# Patient Record
Sex: Male | Born: 1945 | Race: White | Hispanic: No | Smoking: Former smoker
Health system: Southern US, Community
[De-identification: ages and names within clinical notes are randomized; demographics above are authoritative.]

## PROBLEM LIST (undated history)

## (undated) DIAGNOSIS — J439 Emphysema, unspecified: Secondary | ICD-10-CM

## (undated) DIAGNOSIS — I509 Heart failure, unspecified: Secondary | ICD-10-CM

## (undated) DIAGNOSIS — I4891 Unspecified atrial fibrillation: Secondary | ICD-10-CM

## (undated) DIAGNOSIS — H3581 Retinal edema: Secondary | ICD-10-CM

## (undated) DIAGNOSIS — H35039 Hypertensive retinopathy, unspecified eye: Secondary | ICD-10-CM

## (undated) DIAGNOSIS — H35359 Cystoid macular degeneration, unspecified eye: Secondary | ICD-10-CM

## (undated) DIAGNOSIS — Z87898 Personal history of other specified conditions: Secondary | ICD-10-CM

## (undated) DIAGNOSIS — J449 Chronic obstructive pulmonary disease, unspecified: Secondary | ICD-10-CM

## (undated) DIAGNOSIS — C4490 Unspecified malignant neoplasm of skin, unspecified: Secondary | ICD-10-CM

## (undated) DIAGNOSIS — R0602 Shortness of breath: Secondary | ICD-10-CM

## (undated) DIAGNOSIS — I499 Cardiac arrhythmia, unspecified: Secondary | ICD-10-CM

## (undated) DIAGNOSIS — R7303 Prediabetes: Secondary | ICD-10-CM

## (undated) DIAGNOSIS — I4892 Unspecified atrial flutter: Secondary | ICD-10-CM

## (undated) DIAGNOSIS — C801 Malignant (primary) neoplasm, unspecified: Secondary | ICD-10-CM

## (undated) HISTORY — PX: THORACOTOMY: SUR1349

## (undated) HISTORY — DX: Cystoid macular degeneration, unspecified eye: H35.359

## (undated) HISTORY — PX: EXPLORATORY LAPAROTOMY: SUR591

## (undated) HISTORY — DX: Personal history of other specified conditions: Z87.898

## (undated) HISTORY — DX: Chronic obstructive pulmonary disease, unspecified: J44.9

## (undated) HISTORY — PX: FINGER SURGERY: SHX640

## (undated) HISTORY — DX: Retinal edema: H35.81

## (undated) HISTORY — DX: Unspecified malignant neoplasm of skin, unspecified: C44.90

## (undated) HISTORY — DX: Hypertensive retinopathy, unspecified eye: H35.039

## (undated) HISTORY — PX: SKIN CANCER EXCISION: SHX779

## (undated) HISTORY — PX: TONSILLECTOMY: SUR1361

---

## 2003-02-14 ENCOUNTER — Encounter (INDEPENDENT_AMBULATORY_CARE_PROVIDER_SITE_OTHER): Payer: Self-pay | Admitting: *Deleted

## 2003-02-14 ENCOUNTER — Ambulatory Visit (HOSPITAL_COMMUNITY): Admission: RE | Admit: 2003-02-14 | Discharge: 2003-02-14 | Payer: Self-pay | Admitting: Gastroenterology

## 2007-02-27 ENCOUNTER — Encounter: Admission: RE | Admit: 2007-02-27 | Discharge: 2007-02-27 | Payer: Self-pay | Admitting: Rheumatology

## 2007-02-28 ENCOUNTER — Encounter: Admission: RE | Admit: 2007-02-28 | Discharge: 2007-02-28 | Payer: Self-pay | Admitting: Rheumatology

## 2010-12-07 NOTE — Op Note (Signed)
NAME:  Jason Carter, Jason Carter NO.:  [id number]   MEDICAL RECORD NO.:  [id number]                   PATIENT TYPE:  AMB   LOCATION:  ENDO                                 FACILITY:  [facility]   PHYSICIAN:  Anselmo Rod, M.D.               DATE OF BIRTH:  03-13-1946   DATE OF PROCEDURE:  02/14/2003  DATE OF DISCHARGE:                                 OPERATIVE REPORT   PROCEDURE PERFORMED:  Colonoscopy with snare polypectomy times one and cold  biopsies times six.   ENDOSCOPIST:  Charna Elizabeth, M.D.   INSTRUMENT USED:  Olympus video colonoscope.   INDICATIONS FOR PROCEDURE:  Screening colonoscopy is being performed in a 65-  year-old white male with a history of abdominal pain and change in bowel  habits. Rule out colonic polyps, masses, etc.   PREPROCEDURE PREPARATION:  Informed consent was procured from the patient.  The patient was fasted for eight hours prior to the procedure and prepped  with a bottle of magnesium citrate and a gallon of GoLYTELY the night prior  to the procedure.   PREPROCEDURE PHYSICAL:  The patient had stable vital signs.  Neck supple.  Chest clear to auscultation.  S1 and S2 regular.  Abdomen soft with normal  bowel sounds.   DESCRIPTION OF PROCEDURE:  The patient was placed in left lateral decubitus  position and sedated with 70 mg of Demerol and 7 mg of Versed intravenously.  Once the patient was adequately sedated and maintained on low flow oxygen  and continuous cardiac monitoring, the Olympus video colonoscope was  advanced from the rectum to the cecum with difficulty.  There was evidence  of extensive diverticulosis throughout the left colon with inspissated stool  in some of the diverticula.  A pedunculated polyp was snared from 12 cm.  Multiple small sessile polyps were biopsied from 12 and 20 cm which was done  by cold biopsy forceps.  The rest of the colonic mucosa including the  transverse colon.  The right colon, cecum  and terminal ileum appeared  normal.  Retroflexion in the rectum revealed no abnormalities.  The patient  tolerated the procedure well without complication.   IMPRESSION:  1. A pedunculated polyp and multiple small sessile polyps were removed (see     description above).  2. Extensive left-sided diverticulosis.  3. Normal-appearing transverse colon, right colon, cecum and terminal ileum.               RECOMMENDATIONS:  1. Await pathology results.  2. High fiber diet with liberal fluid intake.  3. Avoid all nonsteroidals including aspirin for the next two or three     weeks.  4. Outpatient follow-up in the next two weeks for further recommendations.  Anselmo Rod, M.D.    JNM/MEDQ  D:  02/15/2003  T:  02/16/2003  Job:  [id number]   cc:   Gabriel Earing, M.D.  [address]  [place]  [place] [address]  Fax: [phone]

## 2011-04-15 ENCOUNTER — Inpatient Hospital Stay (HOSPITAL_COMMUNITY)
Admission: EM | Admit: 2011-04-15 | Discharge: 2011-04-21 | DRG: 340 | Disposition: A | Discharge status: Home / Self Care | Payer: Medicare Other | Attending: General Surgery | Admitting: General Surgery

## 2011-04-15 ENCOUNTER — Emergency Department (HOSPITAL_COMMUNITY): Payer: Medicare Other

## 2011-04-15 DIAGNOSIS — J4489 Other specified chronic obstructive pulmonary disease: Secondary | ICD-10-CM | POA: Diagnosis present

## 2011-04-15 DIAGNOSIS — K352 Acute appendicitis with generalized peritonitis, without abscess: Secondary | ICD-10-CM

## 2011-04-15 DIAGNOSIS — R1031 Right lower quadrant pain: Secondary | ICD-10-CM

## 2011-04-15 DIAGNOSIS — J449 Chronic obstructive pulmonary disease, unspecified: Secondary | ICD-10-CM | POA: Diagnosis present

## 2011-04-15 DIAGNOSIS — R112 Nausea with vomiting, unspecified: Secondary | ICD-10-CM

## 2011-04-15 DIAGNOSIS — K35209 Acute appendicitis with generalized peritonitis, without abscess, unspecified as to perforation: Principal | ICD-10-CM | POA: Diagnosis present

## 2011-04-15 DIAGNOSIS — Z5332 Thoracoscopic surgical procedure converted to open procedure: Secondary | ICD-10-CM

## 2011-04-15 LAB — CBC
HCT: 39 % (ref 39.0–52.0)
MCHC: 33.6 g/dL (ref 30.0–36.0)
MCV: 88.2 fL (ref 78.0–100.0)
Platelets: 186 10*3/uL (ref 150–400)
RDW: 14 % (ref 11.5–15.5)
WBC: 24.9 10*3/uL — ABNORMAL HIGH (ref 4.0–10.5)

## 2011-04-15 LAB — URINE MICROSCOPIC-ADD ON

## 2011-04-15 LAB — DIFFERENTIAL
Eosinophils Absolute: 0 10*3/uL (ref 0.0–0.7)
Eosinophils Relative: 0 % (ref 0–5)
Lymphocytes Relative: 8 % — ABNORMAL LOW (ref 12–46)
Lymphs Abs: 2.1 10*3/uL (ref 0.7–4.0)
Monocytes Absolute: 2.5 10*3/uL — ABNORMAL HIGH (ref 0.1–1.0)

## 2011-04-15 LAB — LIPASE, BLOOD: Lipase: 9 U/L — ABNORMAL LOW (ref 11–59)

## 2011-04-15 LAB — COMPREHENSIVE METABOLIC PANEL
AST: 21 U/L (ref 0–37)
Albumin: 3.6 g/dL (ref 3.5–5.2)
BUN: 17 mg/dL (ref 6–23)
Calcium: 9.4 mg/dL (ref 8.4–10.5)
Chloride: 94 mEq/L — ABNORMAL LOW (ref 96–112)
Creatinine, Ser: 0.79 mg/dL (ref 0.50–1.35)
Total Protein: 7.2 g/dL (ref 6.0–8.3)

## 2011-04-15 LAB — URINALYSIS, ROUTINE W REFLEX MICROSCOPIC
Glucose, UA: NEGATIVE mg/dL
Nitrite: NEGATIVE
Specific Gravity, Urine: 1.024 (ref 1.005–1.030)
pH: 5.5 (ref 5.0–8.0)

## 2011-04-15 MED ORDER — IOHEXOL 300 MG/ML  SOLN
100.0000 mL | Freq: Once | INTRAMUSCULAR | Status: AC | PRN
  Administered 2011-04-15: 100 mL via INTRAVENOUS

## 2011-04-16 ENCOUNTER — Other Ambulatory Visit (INDEPENDENT_AMBULATORY_CARE_PROVIDER_SITE_OTHER): Payer: Self-pay | Admitting: General Surgery

## 2011-04-16 HISTORY — PX: APPENDECTOMY: SHX54

## 2011-04-16 NOTE — Op Note (Signed)
Jason Carter, Jason Carter NO.:  [id number]  MEDICAL RECORD NO.:  [id number]  LOCATION:  WLED                         FACILITY:  [facility]  PHYSICIAN:  Angelia Mould. Derrell Lolling, M.D.DATE OF BIRTH:  [birth year]  DATE OF PROCEDURE:  04/16/2011 DATE OF DISCHARGE:                              OPERATIVE REPORT   PREOPERATIVE DIAGNOSIS:  Ruptured, retrocecal appendicitis.  POSTOPERATIVE DIAGNOSIS:  Ruptured, retrocecal appendicitis.  OPERATION PERFORMED:  Laparoscopy, open appendectomy.  SURGEON:  Angelia Mould. Derrell Lolling, M.D.  OPERATIVE INDICATION:  This is a 65 year old Caucasian man who presented with a 36-hour history of right flank and right upper quadrant pain.  On exam, he was found be tender in the right upper quadrant and right lower quadrant.  His CT scan showed a retrocecal appendicitis with air bubbles and fluid behind the ascending colon.  The appendix went retrocecal all the way up to the subhepatic space on the CT scan.  The patient was evaluated in the emergency room and taken to the operating room urgently.  OPERATIVE FINDINGS:  The patient's appendix was completely retrocecal. It was ruptured in several areas.  There was stool throughout the appendix that had been spilled into the retroperitoneal space but he did not have diffuse peritonitis.  The liver and gallbladder looked normal.  The terminal ileum, cecum, and transverse colon looked normal.  OPERATIVE TECHNIQUE:  Following induction of general endotracheal anesthesia, Foley catheter was inserted.  The abdomen was prepped and draped in the sterile fashion.  Surgical time-out was held identifying correct patient and correct procedure.  Intravenous antibiotics had been given in the emergency room immediately preop.  Using an open technique, I placed an 11 mm Hassan trocar in the midline just above the umbilicus.  Entry was uneventful.  Pneumoperitoneum was created.  Video cam was inserted.  I placed a 5 mm  trocar in the right upper quadrant, 5 mm trocar in the left midabdomen and 12 mm trocar in the suprapubic area.  I spent about 45 minutes trying to mobilize and dissect the appendix.  I mobilized the terminal ileum and the right colon by dividing their lateral peritoneal attachments.  I looked from the right upper quadrant and the right lower quadrant.  We never really could see the base of the appendix or the tip of the appendix.  We were concerned that we would enter the right colon or the duodenum and so after lengthy dissection, we abandoned the laparoscopic approach and converted to an open operation.  A transverse incision was made in the right abdomen just at the level of the umbilicus.  Dissection was carried down through the subcutaneous tissue.  The lateral aspect of the rectus muscle and the internal and external oblique and transversus abdominis muscles were divided.  We entered the abdomen and divided the peritoneum.  Self-retaining retractors were placed.  We further mobilized the hepatic flexure, and in doing so, encountered the tip of the ruptured appendix and some stool around the appendix. This allowed [place] to trace the appendix all the way back down to its insertion in the base of the cecum which was completely posterior.  We isolated the base of the  appendix and transected it at the level of the cecum with a GIA stapler.  We oversewed the staple line on the cecum with three interrupted sutures of 3-0 Vicryl.  We took a sharp scissor dissection and mobilized the appendix off the back wall of the cecum and the right colon.  We felt that we removed the entirety of the appendix, although it was ruptured in several places. It was sent to pathology.  We inspected the terminal ileum, the right colon, and the hepatic flexure.  There was no injury from the dissection.  The liver and gallbladder looked fine.  There was no injury there.  We irrigated out the pelvis,  abdomen, subphrenic spaces with about 3 liters of saline. There was a lot of raw surface in the right retroperitoneum.  I chose to place a 19-French Blake drain through the right upper quadrant trocar site.  I brought this down under the subphrenic space and then down the right paracolic gutter.  The drain was sutured in place with a nylon suture.  I was satisfied with hemostasis.  The transversus abdominis, internal oblique, and posterior rectus sheath were closed with running suture of #1 PDS.  After irrigating the wound, I closed the external oblique and the anterior rectus sheath with a running suture of #1 PDS. All the incisions were closed loosely with staples and we placed Telfa wicks in-between to promote drainage.  Clean bandages were placed and the patient taken to recovery room in stable condition.  Estimated blood loss was about 30 mL.  Complications none.  Sponge, needle, and instrument counts were correct.     Angelia Mould. Derrell Lolling, M.D.     HMI/MEDQ  D:  04/16/2011  T:  04/16/2011  Job:  [id number]  cc:   [facility] [address]  Electronically Signed by Claud Kelp M.D. on 04/16/2011 07:23:36 AM
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
## 2011-04-17 LAB — COMPREHENSIVE METABOLIC PANEL
AST: 22 U/L (ref 0–37)
CO2: 27 mEq/L (ref 19–32)
Calcium: 8.9 mg/dL (ref 8.4–10.5)
Creatinine, Ser: 0.63 mg/dL (ref 0.50–1.35)
GFR calc Af Amer: >60 mL/min (ref >60)
GFR calc non Af Amer: >60 mL/min (ref >60)
Glucose, Bld: 102 mg/dL — ABNORMAL HIGH (ref 70–99)
Sodium: 140 mEq/L (ref 135–145)
Total Protein: 5.5 g/dL — ABNORMAL LOW (ref 6.0–8.3)

## 2011-04-17 LAB — CBC
MCH: 29.1 pg (ref 26.0–34.0)
MCHC: 32.4 g/dL (ref 30.0–36.0)
MCV: 89.9 fL (ref 78.0–100.0)
Platelets: 175 10*3/uL (ref 150–400)
RBC: 3.78 MIL/uL — ABNORMAL LOW (ref 4.22–5.81)

## 2011-04-24 ENCOUNTER — Encounter (INDEPENDENT_AMBULATORY_CARE_PROVIDER_SITE_OTHER): Payer: Self-pay | Admitting: Surgery

## 2011-04-26 ENCOUNTER — Encounter (INDEPENDENT_AMBULATORY_CARE_PROVIDER_SITE_OTHER): Payer: Self-pay | Admitting: Surgery

## 2011-04-26 ENCOUNTER — Ambulatory Visit (INDEPENDENT_AMBULATORY_CARE_PROVIDER_SITE_OTHER): Payer: 59 | Admitting: Surgery

## 2011-04-26 VITALS — BP 110/66 | HR 68 | Temp 97.1°F | Resp 16 | Ht 67.5 in | Wt 179.8 lb

## 2011-04-26 DIAGNOSIS — Z8719 Personal history of other diseases of the digestive system: Secondary | ICD-10-CM
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
## 2011-04-29 ENCOUNTER — Encounter (INDEPENDENT_AMBULATORY_CARE_PROVIDER_SITE_OTHER): Payer: 59

## 2011-05-21 NOTE — Discharge Summary (Signed)
  NAMEORVA, GWALTNEY NO.:  [id number]  MEDICAL RECORD NO.:  [id number]  LOCATION:  1540                         FACILITY:  [facility]  PHYSICIAN:  Velora Heckler, MD      DATE OF BIRTH:  1946/02/02  DATE OF ADMISSION:  04/15/2011 DATE OF DISCHARGE:  04/21/2011                              DISCHARGE SUMMARY   HISTORY OF PRESENT ILLNESS:  Mr. Nott is a 65 year old gentleman, who presented with right-sided abdominal pain, fever, and chills.  He was evaluated by Dr. Derrell Lolling after his workup in the ER showed evidence of a retrocecal ruptured appendicitis.  Decision was made after his evaluation to start the patient on IV antibiotics and admit for operative intervention.  SUMMARY OF HOSPITAL COURSE:  Patient was admitted on April 15, 2011, taken to the operating room and underwent attempted laparoscopic, but ultimately open appendectomy due to the ruptured retrocecal appendix. Patient tolerated the procedure well and was admitted to the floor in a stable condition.  He was placed on IV antibiotics and a drain was placed.  Postoperatively, the patient had good pain control, started having bowel function, never really developed significant ileus.  His drain remained intact and never really put out any purulent fluid.  He eventually started tolerating liquid diet and was gradually advanced to solid diet, which he tolerated well.  He had no other significant complications.  On the postop day #4, the patient was feeling well, but still having some serosanguineous draining from his drains.  Therefore, the decision was made to send the patient home with his drain intact.  DISCHARGE DIAGNOSIS:  Acute ruptured appendicitis, status post open appendectomy.  DISCHARGE MEDICATIONS:  Will include: 1. Augmentin 875 1 tablet twice daily for 7 days. 2. Percocet 1 to 2 tabs q.4 hours p.r.n., pain. 3. Phenergan 12.5 mg every 4 hours p.r.n., nausea. 4. Allopurinol 300 mg daily. 5.  Ibuprofen 200 mg every 8 hours p.r.n. 6. Multivitamin once daily. He is, otherwise, given preprinted discharge instructions to follow up in our clinic for drain removal.     Brayton El, PA-C   ______________________________ Velora Heckler, MD   KB/MEDQ  D:  05/15/2011  T:  05/16/2011  Job:  [id number]  Electronically Signed by Darnell Level MD on 05/21/2011 10:55:01 AM

## 2015-11-29 DIAGNOSIS — J40 Bronchitis, not specified as acute or chronic: Secondary | ICD-10-CM | POA: Diagnosis not present

## 2015-11-29 DIAGNOSIS — R0989 Other specified symptoms and signs involving the circulatory and respiratory systems: Secondary | ICD-10-CM | POA: Diagnosis not present

## 2015-11-29 DIAGNOSIS — R05 Cough: Secondary | ICD-10-CM | POA: Diagnosis not present

## 2015-11-29 DIAGNOSIS — R062 Wheezing: Secondary | ICD-10-CM | POA: Diagnosis not present

## 2016-02-20 DIAGNOSIS — M25562 Pain in left knee: Secondary | ICD-10-CM | POA: Diagnosis not present

## 2016-02-20 DIAGNOSIS — M542 Cervicalgia: Secondary | ICD-10-CM | POA: Diagnosis not present

## 2016-02-20 DIAGNOSIS — M109 Gout, unspecified: Secondary | ICD-10-CM | POA: Diagnosis not present

## 2016-02-20 DIAGNOSIS — R05 Cough: Secondary | ICD-10-CM | POA: Diagnosis not present

## 2016-04-22 DIAGNOSIS — Z23 Encounter for immunization: Secondary | ICD-10-CM | POA: Diagnosis not present

## 2016-07-02 DIAGNOSIS — R0609 Other forms of dyspnea: Secondary | ICD-10-CM | POA: Diagnosis not present

## 2016-07-02 DIAGNOSIS — D71 Functional disorders of polymorphonuclear neutrophils: Secondary | ICD-10-CM | POA: Diagnosis not present

## 2016-07-02 DIAGNOSIS — R062 Wheezing: Secondary | ICD-10-CM | POA: Diagnosis not present

## 2016-07-02 DIAGNOSIS — J984 Other disorders of lung: Secondary | ICD-10-CM | POA: Diagnosis not present

## 2016-07-02 DIAGNOSIS — R05 Cough: Secondary | ICD-10-CM | POA: Diagnosis not present

## 2016-07-02 DIAGNOSIS — Z7689 Persons encountering health services in other specified circumstances: Secondary | ICD-10-CM | POA: Diagnosis not present

## 2017-02-03 ENCOUNTER — Encounter: Payer: Self-pay | Admitting: Physician Assistant

## 2017-02-03 ENCOUNTER — Ambulatory Visit (INDEPENDENT_AMBULATORY_CARE_PROVIDER_SITE_OTHER): Payer: Medicare Other | Admitting: Physician Assistant

## 2017-02-03 VITALS — BP 126/88 | HR 107 | Temp 97.8°F | Resp 18 | Ht 67.5 in | Wt 179.0 lb

## 2017-02-03 DIAGNOSIS — M25462 Effusion, left knee: Secondary | ICD-10-CM | POA: Diagnosis not present
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
## 2017-02-03 NOTE — Patient Instructions (Signed)
     IF you received an x-ray today, you will receive an invoice from [facility]. Please contact  Radiology at [phone] with questions or concerns regarding your invoice.   IF you received labwork today, you will receive an invoice from [facility]. Please contact [facility] at [phone] with questions or concerns regarding your invoice.   Our billing staff will not be able to assist you with questions regarding bills from these companies.  You will be contacted with the lab results as soon as they are available. The fastest way to get your results is to activate your My Chart account. Instructions are located on the last page of this paperwork. If you have not heard from us regarding the results in 2 weeks, please contact this office.     

## 2017-02-05 ENCOUNTER — Telehealth: Payer: Self-pay

## 2017-02-05 NOTE — Telephone Encounter (Addendum)
-----   Message from Tereasa Coop, PA-C sent at 02/05/2017  2:53 PM EDT ----- Please give him a call.  How is he feeling.  The fluid analysis is very non specific.  There is no gout. He'd probably be fine given his normal creatinine with 400-600 mg ibuprofen every 8 hours.  I'd be very comfortable with tramadol 25-50 every 8 if he'd like that.  Let me know know and I will write if he desires.  Other options are an ortho referral, retap if the knee has re-effused with an image, however the lack of blood in the assay points away from tendinous or ligamentous injury. Philis Fendt, MS, PA-C 2:53 PM, 02/05/2017        Philis Fendt, MS, PA-C 2:49 PM, 02/05/2017  Call placed to patient as requested, no answer on patient phone. Left message for patient to return call to office./ S.Zamantha Strebel,CMA

## 2017-02-06 ENCOUNTER — Other Ambulatory Visit: Payer: Self-pay | Admitting: Emergency Medicine

## 2017-02-06 MED ORDER — ALLOPURINOL 300 MG PO TABS: 300.0000 mg | ORAL_TABLET | ORAL | 0 refills | Status: DC

## 2017-02-06 NOTE — Telephone Encounter (Signed)
PATIENT IS RETURNING A CALL FROM YESTERDAY (02/05/17) FOR HIS LAB RESULTS. [name]. BEST PHONE [phone] (HOME) PHARMACY IF NEEDED IS [place] AND [place]. [place]

## 2017-02-06 NOTE — Telephone Encounter (Signed)
Pt is feeling much better. Quintella Baton, knee swelling has improved. Results given. Declined taking Tramadol at this time or Ortho referral.  Requesting medication refill Allopurinol as needed for gout attack Per Legrand Como, 30 day supply given

## 2017-02-07 LAB — SYNOVIAL FLUID PANEL
EOS FL: 0 %
GLUCOSE FL: 36 mg/dL
LINING CELLS, SYNOVIAL: 0 %
LYMPHS FL: 7 %
MACROPHAGES FLD: 7 %
NUC CELL # FLD: 4980 {cells}/uL — AB (ref 0–200)
PROTEIN FL: 4.8 g/dL
Polys, Fluid: 86 %

## 2017-02-11 ENCOUNTER — Ambulatory Visit (INDEPENDENT_AMBULATORY_CARE_PROVIDER_SITE_OTHER): Payer: Medicare Other | Admitting: Physician Assistant

## 2017-02-11 ENCOUNTER — Ambulatory Visit (INDEPENDENT_AMBULATORY_CARE_PROVIDER_SITE_OTHER): Payer: Medicare Other

## 2017-02-11 ENCOUNTER — Encounter: Payer: Self-pay | Admitting: Physician Assistant

## 2017-02-11 VITALS — BP 120/68 | HR 104 | Temp 98.6°F | Resp 18 | Ht 65.0 in | Wt 180.0 lb

## 2017-02-11 DIAGNOSIS — M25462 Effusion, left knee: Secondary | ICD-10-CM

## 2017-02-11 DIAGNOSIS — Z131 Encounter for screening for diabetes mellitus: Secondary | ICD-10-CM | POA: Diagnosis not present

## 2017-02-11 DIAGNOSIS — M1A9XX0 Chronic gout, unspecified, without tophus (tophi): Secondary | ICD-10-CM

## 2017-02-11 DIAGNOSIS — Z23 Encounter for immunization: Secondary | ICD-10-CM

## 2017-02-11 DIAGNOSIS — R7303 Prediabetes: Secondary | ICD-10-CM | POA: Diagnosis not present

## 2017-02-11 DIAGNOSIS — M1712 Unilateral primary osteoarthritis, left knee: Secondary | ICD-10-CM | POA: Diagnosis not present

## 2017-02-11 DIAGNOSIS — M179 Osteoarthritis of knee, unspecified: Secondary | ICD-10-CM | POA: Diagnosis not present

## 2017-02-11 HISTORY — DX: Prediabetes: R73.03

## 2017-02-11 LAB — POCT GLYCOSYLATED HEMOGLOBIN (HGB A1C): HEMOGLOBIN A1C: 6.2

## 2017-02-11 MED ORDER — ZOSTER VAC RECOMB ADJUVANTED 50 MCG/0.5ML IM SUSR: 0.5000 mL | Freq: Once | INTRAMUSCULAR | 1 refills | Status: AC

## 2017-02-11 NOTE — Patient Instructions (Addendum)
Exercise improves every system in the body.  It lowers the risk of heart disease, decreases blood pressure, reduces the symptoms of depression and anxiety, and lowers blood sugar. To receive these benefits, try to get 150 minutes of planned exercise each week.  You can break this 150 minutes up however you like.  For instance, you can perform 30 minutes of brisk walking 5 days a week, or perform 50 minutes 3 days a week.  If you don't like walking, or can't find a safe place to walk, find another way to move that you can enjoy.  Exercise tapes, cycling, stair climbing, swimming, or a combination will be just as good as a walking program. To ensure the proper intensity, you can use the talk test. Essentially, you should be able to carry on a conversation, but you should have to take short breaks from the conversation in order catch your breath.     IF you received an x-ray today, you will receive an invoice from [facility]. Please contact [facility] at [phone] with questions or concerns regarding your invoice.   IF you received labwork today, you will receive an invoice from [facility]. Please contact [facility] at [phone] with questions or concerns regarding your invoice.   Our billing staff will not be able to assist you with questions regarding bills from these companies.  You will be contacted with the lab results as soon as they are available. The fastest way to get your results is to activate your My Chart account. Instructions are located on the last page of this paperwork. If you have not heard from [place] regarding the results in 2 weeks, please contact this office.
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
## 2017-02-14 DIAGNOSIS — M1A9XX0 Chronic gout, unspecified, without tophus (tophi): Secondary | ICD-10-CM | POA: Insufficient documentation

## 2017-02-14 MED ORDER — ALLOPURINOL 300 MG PO TABS: 300.0000 mg | ORAL_TABLET | ORAL | 3 refills | Status: DC

## 2017-04-03 ENCOUNTER — Other Ambulatory Visit: Payer: Self-pay | Admitting: Physician Assistant

## 2017-04-03 NOTE — Telephone Encounter (Signed)
Will forward to PA Clark to determine if refill appropriate (synovial fluid negative for crystals).

## 2017-04-05 DIAGNOSIS — Z23 Encounter for immunization: Secondary | ICD-10-CM | POA: Diagnosis not present

## 2017-05-05 ENCOUNTER — Ambulatory Visit (INDEPENDENT_AMBULATORY_CARE_PROVIDER_SITE_OTHER): Payer: Medicare Other

## 2017-05-05 ENCOUNTER — Encounter: Payer: Self-pay | Admitting: Physician Assistant

## 2017-05-05 ENCOUNTER — Ambulatory Visit (INDEPENDENT_AMBULATORY_CARE_PROVIDER_SITE_OTHER): Payer: Medicare Other | Admitting: Physician Assistant

## 2017-05-05 VITALS — BP 126/70 | HR 110 | Temp 98.1°F | Resp 16 | Ht 65.0 in | Wt 174.6 lb

## 2017-05-05 DIAGNOSIS — R7303 Prediabetes: Secondary | ICD-10-CM

## 2017-05-05 DIAGNOSIS — Z131 Encounter for screening for diabetes mellitus: Secondary | ICD-10-CM

## 2017-05-05 DIAGNOSIS — M25532 Pain in left wrist: Secondary | ICD-10-CM | POA: Diagnosis not present

## 2017-05-05 DIAGNOSIS — M19032 Primary osteoarthritis, left wrist: Secondary | ICD-10-CM | POA: Diagnosis not present

## 2017-05-05 DIAGNOSIS — M19042 Primary osteoarthritis, left hand: Secondary | ICD-10-CM | POA: Diagnosis not present

## 2017-05-05 LAB — POCT GLYCOSYLATED HEMOGLOBIN (HGB A1C): HEMOGLOBIN A1C: 6

## 2017-05-05 MED ORDER — COLCHICINE 0.6 MG PO TABS: 0.6000 mg | ORAL_TABLET | Freq: Every day | ORAL | 0 refills | Status: DC
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
## 2017-05-05 NOTE — Patient Instructions (Addendum)
  You a1c is much better.  Keep doing whatever you are doing.     IF you received an x-ray today, you will receive an invoice from [facility]. Please contact [facility] at [phone] with questions or concerns regarding your invoice.   IF you received labwork today, you will receive an invoice from [facility]. Please contact [facility] at [phone] with questions or concerns regarding your invoice.   Our billing staff will not be able to assist you with questions regarding bills from these companies.  You will be contacted with the lab results as soon as they are available. The fastest way to get your results is to activate your My Chart account. Instructions are located on the last page of this paperwork. If you have not heard from [place] regarding the results in 2 weeks, please contact this office.

## 2017-07-22 HISTORY — PX: EYE SURGERY: SHX253

## 2017-07-22 HISTORY — PX: CATARACT EXTRACTION: SUR2

## 2018-02-27 ENCOUNTER — Encounter: Payer: Self-pay | Admitting: Physician Assistant

## 2018-02-27 DIAGNOSIS — H35033 Hypertensive retinopathy, bilateral: Secondary | ICD-10-CM | POA: Diagnosis not present

## 2018-02-27 DIAGNOSIS — H25013 Cortical age-related cataract, bilateral: Secondary | ICD-10-CM | POA: Diagnosis not present

## 2018-02-27 DIAGNOSIS — H25041 Posterior subcapsular polar age-related cataract, right eye: Secondary | ICD-10-CM | POA: Diagnosis not present

## 2018-02-27 DIAGNOSIS — H2513 Age-related nuclear cataract, bilateral: Secondary | ICD-10-CM | POA: Diagnosis not present

## 2018-02-27 LAB — HM DIABETES EYE EXAM

## 2018-03-20 DIAGNOSIS — H25041 Posterior subcapsular polar age-related cataract, right eye: Secondary | ICD-10-CM | POA: Diagnosis not present

## 2018-03-20 DIAGNOSIS — H35033 Hypertensive retinopathy, bilateral: Secondary | ICD-10-CM | POA: Diagnosis not present

## 2018-03-20 DIAGNOSIS — H2511 Age-related nuclear cataract, right eye: Secondary | ICD-10-CM | POA: Diagnosis not present

## 2018-03-20 DIAGNOSIS — H25013 Cortical age-related cataract, bilateral: Secondary | ICD-10-CM | POA: Diagnosis not present

## 2018-03-20 DIAGNOSIS — H2513 Age-related nuclear cataract, bilateral: Secondary | ICD-10-CM | POA: Diagnosis not present

## 2018-03-30 DIAGNOSIS — Z23 Encounter for immunization: Secondary | ICD-10-CM | POA: Diagnosis not present

## 2018-04-08 DIAGNOSIS — K1321 Leukoplakia of oral mucosa, including tongue: Secondary | ICD-10-CM | POA: Diagnosis not present

## 2018-04-14 DIAGNOSIS — H25811 Combined forms of age-related cataract, right eye: Secondary | ICD-10-CM | POA: Diagnosis not present

## 2018-04-14 DIAGNOSIS — H2511 Age-related nuclear cataract, right eye: Secondary | ICD-10-CM | POA: Diagnosis not present

## 2018-05-04 DIAGNOSIS — H2512 Age-related nuclear cataract, left eye: Secondary | ICD-10-CM | POA: Diagnosis not present

## 2018-05-04 DIAGNOSIS — H25012 Cortical age-related cataract, left eye: Secondary | ICD-10-CM | POA: Diagnosis not present

## 2018-05-12 DIAGNOSIS — H2512 Age-related nuclear cataract, left eye: Secondary | ICD-10-CM | POA: Diagnosis not present

## 2018-05-12 DIAGNOSIS — H25812 Combined forms of age-related cataract, left eye: Secondary | ICD-10-CM | POA: Diagnosis not present

## 2018-06-16 ENCOUNTER — Other Ambulatory Visit: Payer: Self-pay | Admitting: Physician Assistant

## 2018-06-16 DIAGNOSIS — M1A9XX0 Chronic gout, unspecified, without tophus (tophi): Secondary | ICD-10-CM

## 2019-04-02 DIAGNOSIS — Z23 Encounter for immunization: Secondary | ICD-10-CM | POA: Diagnosis not present

## 2019-09-20 DIAGNOSIS — Z23 Encounter for immunization: Secondary | ICD-10-CM | POA: Diagnosis not present

## 2019-10-18 DIAGNOSIS — Z23 Encounter for immunization: Secondary | ICD-10-CM | POA: Diagnosis not present

## 2019-12-28 DIAGNOSIS — Z961 Presence of intraocular lens: Secondary | ICD-10-CM | POA: Diagnosis not present

## 2019-12-28 DIAGNOSIS — H11432 Conjunctival hyperemia, left eye: Secondary | ICD-10-CM | POA: Diagnosis not present

## 2019-12-28 DIAGNOSIS — H35033 Hypertensive retinopathy, bilateral: Secondary | ICD-10-CM | POA: Diagnosis not present

## 2019-12-28 DIAGNOSIS — H35352 Cystoid macular degeneration, left eye: Secondary | ICD-10-CM | POA: Diagnosis not present

## 2019-12-29 ENCOUNTER — Encounter (INDEPENDENT_AMBULATORY_CARE_PROVIDER_SITE_OTHER): Payer: Self-pay | Admitting: Ophthalmology

## 2019-12-29 ENCOUNTER — Ambulatory Visit (INDEPENDENT_AMBULATORY_CARE_PROVIDER_SITE_OTHER): Payer: Medicare Other | Admitting: Ophthalmology

## 2019-12-29 ENCOUNTER — Other Ambulatory Visit: Payer: Self-pay

## 2019-12-29 DIAGNOSIS — H3581 Retinal edema: Secondary | ICD-10-CM

## 2019-12-29 DIAGNOSIS — Z961 Presence of intraocular lens: Secondary | ICD-10-CM

## 2019-12-29 DIAGNOSIS — I10 Essential (primary) hypertension: Secondary | ICD-10-CM

## 2019-12-29 DIAGNOSIS — H35352 Cystoid macular degeneration, left eye: Secondary | ICD-10-CM

## 2019-12-29 DIAGNOSIS — H15012 Anterior scleritis, left eye: Secondary | ICD-10-CM

## 2019-12-29 DIAGNOSIS — H35033 Hypertensive retinopathy, bilateral: Secondary | ICD-10-CM | POA: Diagnosis not present

## 2019-12-29 MED ORDER — PREDNISOLONE ACETATE 1 % OP SUSP: 1.0000 [drp] | Freq: Four times a day (QID) | OPHTHALMIC | 3 refills | Status: DC

## 2019-12-29 MED ORDER — OMEPRAZOLE 20 MG PO CPDR: 20.0000 mg | DELAYED_RELEASE_CAPSULE | Freq: Every day | ORAL | 3 refills | Status: DC

## 2019-12-29 MED ORDER — PROLENSA 0.07 % OP SOLN: 1.0000 [drp] | Freq: Four times a day (QID) | OPHTHALMIC | 3 refills | Status: DC
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
## 2020-01-12 ENCOUNTER — Ambulatory Visit (INDEPENDENT_AMBULATORY_CARE_PROVIDER_SITE_OTHER): Payer: Medicare Other | Admitting: Ophthalmology

## 2020-01-12 ENCOUNTER — Other Ambulatory Visit: Payer: Self-pay

## 2020-01-12 ENCOUNTER — Encounter (INDEPENDENT_AMBULATORY_CARE_PROVIDER_SITE_OTHER): Payer: Self-pay | Admitting: Ophthalmology

## 2020-01-12 DIAGNOSIS — Z961 Presence of intraocular lens: Secondary | ICD-10-CM | POA: Diagnosis not present

## 2020-01-12 DIAGNOSIS — H3581 Retinal edema: Secondary | ICD-10-CM

## 2020-01-12 DIAGNOSIS — I10 Essential (primary) hypertension: Secondary | ICD-10-CM | POA: Diagnosis not present

## 2020-01-12 DIAGNOSIS — H35033 Hypertensive retinopathy, bilateral: Secondary | ICD-10-CM

## 2020-01-12 DIAGNOSIS — H15012 Anterior scleritis, left eye: Secondary | ICD-10-CM

## 2020-01-12 DIAGNOSIS — H35352 Cystoid macular degeneration, left eye: Secondary | ICD-10-CM

## 2020-01-12 MED ORDER — TRIAMCINOLONE ACETONIDE 40 MG/ML IJ SUSP FOR KALEIDOSCOPE
4.0000 mg | INTRAMUSCULAR | Status: AC | PRN
  Administered 2020-01-12: 4 mg

## 2020-01-12 MED ORDER — INDOMETHACIN 50 MG PO CAPS
50.0000 mg | ORAL_CAPSULE | Freq: Three times a day (TID) | ORAL | 1 refills | Status: DC
Start: 2020-01-12 — End: 2020-06-08
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
## 2020-01-31 ENCOUNTER — Encounter (INDEPENDENT_AMBULATORY_CARE_PROVIDER_SITE_OTHER): Payer: Self-pay | Admitting: Ophthalmology

## 2020-01-31 ENCOUNTER — Ambulatory Visit (INDEPENDENT_AMBULATORY_CARE_PROVIDER_SITE_OTHER): Payer: Medicare Other | Admitting: Ophthalmology

## 2020-01-31 ENCOUNTER — Other Ambulatory Visit: Payer: Self-pay

## 2020-01-31 DIAGNOSIS — I10 Essential (primary) hypertension: Secondary | ICD-10-CM

## 2020-01-31 DIAGNOSIS — H3581 Retinal edema: Secondary | ICD-10-CM | POA: Diagnosis not present

## 2020-01-31 DIAGNOSIS — H35352 Cystoid macular degeneration, left eye: Secondary | ICD-10-CM

## 2020-01-31 DIAGNOSIS — H35033 Hypertensive retinopathy, bilateral: Secondary | ICD-10-CM

## 2020-01-31 DIAGNOSIS — H15012 Anterior scleritis, left eye: Secondary | ICD-10-CM

## 2020-01-31 DIAGNOSIS — Z961 Presence of intraocular lens: Secondary | ICD-10-CM | POA: Diagnosis not present
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
## 2020-02-28 ENCOUNTER — Ambulatory Visit (INDEPENDENT_AMBULATORY_CARE_PROVIDER_SITE_OTHER): Payer: Medicare Other | Admitting: Ophthalmology

## 2020-02-28 ENCOUNTER — Other Ambulatory Visit: Payer: Self-pay

## 2020-02-28 ENCOUNTER — Encounter (INDEPENDENT_AMBULATORY_CARE_PROVIDER_SITE_OTHER): Payer: Self-pay | Admitting: Ophthalmology

## 2020-02-28 DIAGNOSIS — H3581 Retinal edema: Secondary | ICD-10-CM | POA: Diagnosis not present

## 2020-02-28 DIAGNOSIS — H35352 Cystoid macular degeneration, left eye: Secondary | ICD-10-CM

## 2020-02-28 DIAGNOSIS — H35033 Hypertensive retinopathy, bilateral: Secondary | ICD-10-CM

## 2020-02-28 DIAGNOSIS — I10 Essential (primary) hypertension: Secondary | ICD-10-CM | POA: Diagnosis not present

## 2020-02-28 DIAGNOSIS — H15012 Anterior scleritis, left eye: Secondary | ICD-10-CM

## 2020-02-28 DIAGNOSIS — Z961 Presence of intraocular lens: Secondary | ICD-10-CM

## 2020-03-16 DIAGNOSIS — H35352 Cystoid macular degeneration, left eye: Secondary | ICD-10-CM | POA: Diagnosis not present

## 2020-03-16 DIAGNOSIS — H35033 Hypertensive retinopathy, bilateral: Secondary | ICD-10-CM | POA: Diagnosis not present

## 2020-03-16 DIAGNOSIS — H11432 Conjunctival hyperemia, left eye: Secondary | ICD-10-CM | POA: Diagnosis not present

## 2020-03-16 DIAGNOSIS — H40052 Ocular hypertension, left eye: Secondary | ICD-10-CM | POA: Diagnosis not present

## 2020-03-22 DIAGNOSIS — C801 Malignant (primary) neoplasm, unspecified: Secondary | ICD-10-CM

## 2020-03-22 HISTORY — DX: Malignant (primary) neoplasm, unspecified: C80.1

## 2020-03-28 ENCOUNTER — Other Ambulatory Visit: Payer: Self-pay

## 2020-03-28 ENCOUNTER — Ambulatory Visit (INDEPENDENT_AMBULATORY_CARE_PROVIDER_SITE_OTHER): Payer: Medicare Other

## 2020-03-28 ENCOUNTER — Ambulatory Visit (HOSPITAL_COMMUNITY)
Admission: EM | Admit: 2020-03-28 | Discharge: 2020-03-28 | Disposition: A | Discharge status: Home / Self Care | Payer: Medicare Other | Attending: Urgent Care | Admitting: Urgent Care

## 2020-03-28 ENCOUNTER — Encounter (HOSPITAL_COMMUNITY): Payer: Self-pay

## 2020-03-28 DIAGNOSIS — Z79899 Other long term (current) drug therapy: Secondary | ICD-10-CM | POA: Insufficient documentation

## 2020-03-28 DIAGNOSIS — M109 Gout, unspecified: Secondary | ICD-10-CM | POA: Insufficient documentation

## 2020-03-28 DIAGNOSIS — Z87891 Personal history of nicotine dependence: Secondary | ICD-10-CM | POA: Diagnosis not present

## 2020-03-28 DIAGNOSIS — R079 Chest pain, unspecified: Secondary | ICD-10-CM

## 2020-03-28 DIAGNOSIS — R0602 Shortness of breath: Secondary | ICD-10-CM

## 2020-03-28 DIAGNOSIS — J441 Chronic obstructive pulmonary disease with (acute) exacerbation: Secondary | ICD-10-CM

## 2020-03-28 DIAGNOSIS — Z20822 Contact with and (suspected) exposure to covid-19: Secondary | ICD-10-CM | POA: Diagnosis not present

## 2020-03-28 DIAGNOSIS — R918 Other nonspecific abnormal finding of lung field: Secondary | ICD-10-CM | POA: Diagnosis not present

## 2020-03-28 LAB — CBC
HCT: 43.4 % (ref 39.0–52.0)
Hemoglobin: 13.5 g/dL (ref 13.0–17.0)
MCH: 29 pg (ref 26.0–34.0)
MCHC: 31.1 g/dL (ref 30.0–36.0)
MCV: 93.1 fL (ref 80.0–100.0)
Platelets: 210 10*3/uL (ref 150–400)
RBC: 4.66 MIL/uL (ref 4.22–5.81)
RDW: 14.3 % (ref 11.5–15.5)
WBC: 7.8 10*3/uL (ref 4.0–10.5)
nRBC: 0 % (ref 0.0–0.2)

## 2020-03-28 LAB — COMPREHENSIVE METABOLIC PANEL
ALT: 20 U/L (ref 0–44)
AST: 19 U/L (ref 15–41)
Albumin: 4.1 g/dL (ref 3.5–5.0)
Alkaline Phosphatase: 54 U/L (ref 38–126)
Anion gap: 8 (ref 5–15)
BUN: 7 mg/dL — ABNORMAL LOW (ref 8–23)
CO2: 31 mmol/L (ref 22–32)
Calcium: 9.3 mg/dL (ref 8.9–10.3)
Chloride: 103 mmol/L (ref 98–111)
Creatinine, Ser: 0.72 mg/dL (ref 0.61–1.24)
GFR calc Af Amer: >60 mL/min (ref >60)
GFR calc non Af Amer: >60 mL/min (ref >60)
Glucose, Bld: 92 mg/dL (ref 70–99)
Potassium: 4.7 mmol/L (ref 3.5–5.1)
Sodium: 142 mmol/L (ref 135–145)
Total Bilirubin: 1.1 mg/dL (ref 0.3–1.2)
Total Protein: 7.4 g/dL (ref 6.5–8.1)

## 2020-03-28 LAB — SARS CORONAVIRUS 2 (TAT 6-24 HRS): SARS Coronavirus 2: NEGATIVE

## 2020-03-28 MED ORDER — ALBUTEROL SULFATE HFA 108 (90 BASE) MCG/ACT IN AERS: 1.0000 | INHALATION_SPRAY | Freq: Four times a day (QID) | RESPIRATORY_TRACT | 0 refills | Status: DC | PRN

## 2020-03-28 MED ORDER — PREDNISONE 20 MG PO TABS: ORAL_TABLET | ORAL | 0 refills | Status: DC

## 2020-03-28 NOTE — Discharge Instructions (Addendum)
Please make sure you establish care with a new primary care provider. They can help follow up on your shortness of breath and help you get a chest CT scan. If your symptoms worsen then please report to the emergency room.

## 2020-03-28 NOTE — ED Notes (Signed)
Pt's walking Sa02 was 91% at the lowest. Pt c/o SOB w/walking.

## 2020-03-28 NOTE — ED Triage Notes (Signed)
Pt is here with SOB that started 10 days ago, pt states his breathing has gotten worst.
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
## 2020-04-03 ENCOUNTER — Encounter (INDEPENDENT_AMBULATORY_CARE_PROVIDER_SITE_OTHER): Payer: Self-pay

## 2020-04-03 ENCOUNTER — Encounter (INDEPENDENT_AMBULATORY_CARE_PROVIDER_SITE_OTHER): Payer: Medicare Other | Admitting: Ophthalmology

## 2020-04-06 ENCOUNTER — Other Ambulatory Visit: Payer: Self-pay

## 2020-04-06 ENCOUNTER — Encounter: Payer: Self-pay | Admitting: Student

## 2020-04-06 ENCOUNTER — Ambulatory Visit (INDEPENDENT_AMBULATORY_CARE_PROVIDER_SITE_OTHER): Payer: Medicare Other | Admitting: Student

## 2020-04-06 VITALS — BP 138/72 | HR 73 | Temp 98.2°F | Ht 67.0 in | Wt 164.8 lb

## 2020-04-06 DIAGNOSIS — M1A9XX0 Chronic gout, unspecified, without tophus (tophi): Secondary | ICD-10-CM

## 2020-04-06 DIAGNOSIS — I10 Essential (primary) hypertension: Secondary | ICD-10-CM | POA: Insufficient documentation

## 2020-04-06 DIAGNOSIS — R7303 Prediabetes: Secondary | ICD-10-CM | POA: Diagnosis not present

## 2020-04-06 DIAGNOSIS — Z87891 Personal history of nicotine dependence: Secondary | ICD-10-CM | POA: Diagnosis not present

## 2020-04-06 DIAGNOSIS — Z1159 Encounter for screening for other viral diseases: Secondary | ICD-10-CM

## 2020-04-06 DIAGNOSIS — Z23 Encounter for immunization: Secondary | ICD-10-CM

## 2020-04-06 LAB — POCT GLYCOSYLATED HEMOGLOBIN (HGB A1C): Hemoglobin A1C: 5.6 % (ref 4.0–5.6)

## 2020-04-06 LAB — GLUCOSE, CAPILLARY: Glucose-Capillary: 84 mg/dL (ref 70–99)

## 2020-04-06 NOTE — Patient Instructions (Signed)
Dear Jason Carter,  It was a pleasure meeting you in clinic today. Our plan following today's visit is as follows:  For your breathing and cough, continue using the albuterol inhaler 1-2 puffs every 6 hours. Once you run out of this inhaler, please let our clinic know and we will be happy to refill it for you. We also ordered a CT scan for your chest given your smoking history. The radiology team will call you to schedule an appointment in the next few days. Once we get the result from your CT scan, I will call you to discuss the results.  For your history of gout, we are obtaining a uric acid level today. I will call you with the results of this test. If your uric acid level is high, we may need to restart the allopurinol that you were previously on.  For your history of prediabetes, we have obtained a hemoglobin A1c. Given that you have been doing well and dieting, we suspect that this will continue to be well controlled.  For your blood pressure, your blood pressure looks slightly high in clinic today and has been high in the emergency room in the past. We will keep a close eye on this and recheck it at your next visit. If it continues to remain high, you likely would benefit from starting a low dose medication to help the blood pressure come down some.  Otherwise, it was very nice to meet you. If you have any concerns or questions, please call our clinic. If you develop worsening symptoms or shortness of breath, please call our clinic or present to Urgent Care or the Emergency Room.  Sincerely, Dr. Paulla Dolly, MD

## 2020-04-06 NOTE — Assessment & Plan Note (Signed)
Patient has history of hypertension at multiple urgent care / ED visits over the past few years. He has never been on an antihypertensive in the past. His blood pressure today in clinic is 138/72. We discussed the option to start lisinopril 5mg  today vs. Recheck his blood pressure at his next appointment. He desires to hold off on starting this antihypertensive until we get additional readings. -Check blood pressure at next visit -If persistently elevated, consider starting lisinopril 5mg  -Lipid profile
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
## 2020-04-06 NOTE — Assessment & Plan Note (Addendum)
Patient reports long history of tobacco use starting from childhood to 2009. He states during this 45 year period, he smoked on average a pack per day. He has been experiencing increasing shortness of breath with exertion, difficulty catching his breath, and productive cough of light green sputum. He visited an urgent care on 03/28/2020 and was diagnosed with a COPD exacerbation despite no documented history of COPD. He was discharged on prednisone for five days as well as an albuterol inhaler. He states that since then, his breathing and cough have been much improved on albuterol inhaler. He has never had a CT scan of his lungs or PFTs. Given the current COVID climate, it may be difficult for the patient to obtain PFTs, therefore he desires to wait until COVID clears more from our community. He is interested in obtaining a CT Chest. -Continue albuterol inhaler -CT Chest  -Schedule for PFTs in the future

## 2020-04-06 NOTE — Assessment & Plan Note (Addendum)
Patient has history of chronic gout with maintenance therapy with allopurinol 300mg  daily. This prescription ran out approximately two years ago, and he has since not had it refilled. We will check a uric acid level today with goal below 6. If above this level, we can restart his prior dose of allopurinol.  ADDENDUM: Uric acid level of 5.0. Hold off on starting allopurinol at this time as level is below target of 6.

## 2020-04-06 NOTE — Assessment & Plan Note (Addendum)
Patient has history of prediabetes following an A1c of 6.2 obtained in 2018. Since then, he states that he has improved his diet and his subsequent blood glucose readings on BMP/CMPs have been within normal limits, however given his history it is reasonable to check an A1c today for monitoring. -HbA1c  ADDENDUM: A1c today 5.6. Can stop monitoring this condition at future visits.
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
## 2020-04-07 LAB — HEPATITIS C ANTIBODY: Hep C Virus Ab: <0.1 s/co ratio (ref 0.0–0.9)

## 2020-04-07 LAB — LIPID PANEL
Chol/HDL Ratio: 3.1 ratio (ref 0.0–5.0)
Cholesterol, Total: 157 mg/dL (ref 100–199)
HDL: 51 mg/dL (ref >39)
LDL Chol Calc (NIH): 85 mg/dL (ref 0–99)
Triglycerides: 115 mg/dL (ref 0–149)
VLDL Cholesterol Cal: 21 mg/dL (ref 5–40)

## 2020-04-07 LAB — URIC ACID: Uric Acid: 5 mg/dL (ref 3.8–8.4)

## 2020-04-10 ENCOUNTER — Encounter (INDEPENDENT_AMBULATORY_CARE_PROVIDER_SITE_OTHER): Payer: Self-pay | Admitting: Ophthalmology

## 2020-04-10 ENCOUNTER — Ambulatory Visit (INDEPENDENT_AMBULATORY_CARE_PROVIDER_SITE_OTHER): Payer: Medicare Other | Admitting: Ophthalmology

## 2020-04-10 DIAGNOSIS — I10 Essential (primary) hypertension: Secondary | ICD-10-CM | POA: Diagnosis not present

## 2020-04-10 DIAGNOSIS — H35033 Hypertensive retinopathy, bilateral: Secondary | ICD-10-CM

## 2020-04-10 DIAGNOSIS — H35352 Cystoid macular degeneration, left eye: Secondary | ICD-10-CM | POA: Diagnosis not present

## 2020-04-10 DIAGNOSIS — Z961 Presence of intraocular lens: Secondary | ICD-10-CM

## 2020-04-10 DIAGNOSIS — H15012 Anterior scleritis, left eye: Secondary | ICD-10-CM | POA: Diagnosis not present

## 2020-04-10 DIAGNOSIS — H40052 Ocular hypertension, left eye: Secondary | ICD-10-CM | POA: Diagnosis not present

## 2020-04-10 DIAGNOSIS — H3581 Retinal edema: Secondary | ICD-10-CM

## 2020-04-11 NOTE — Progress Notes (Signed)
Internal Medicine Clinic Attending  I saw and evaluated the patient.  I personally confirmed the key portions of the history and exam documented by Dr. Johnson and I reviewed pertinent patient test results.  The assessment, diagnosis, and plan were formulated together and I agree with the documentation in the resident's note.  

## 2020-04-27 ENCOUNTER — Ambulatory Visit (HOSPITAL_COMMUNITY)
Admission: RE | Admit: 2020-04-27 | Discharge: 2020-04-27 | Disposition: A | Discharge status: Home / Self Care | Payer: Medicare Other | Source: Ambulatory Visit | Attending: Internal Medicine | Admitting: Internal Medicine

## 2020-04-27 ENCOUNTER — Other Ambulatory Visit: Payer: Self-pay

## 2020-04-27 DIAGNOSIS — Z87891 Personal history of nicotine dependence: Secondary | ICD-10-CM | POA: Diagnosis not present

## 2020-04-28 ENCOUNTER — Other Ambulatory Visit: Payer: Self-pay | Admitting: Student

## 2020-04-28 ENCOUNTER — Telehealth: Payer: Self-pay | Admitting: *Deleted

## 2020-04-28 DIAGNOSIS — R911 Solitary pulmonary nodule: Secondary | ICD-10-CM

## 2020-04-28 NOTE — Progress Notes (Signed)
Called patient to discuss results of CT chest that was completed on 10/7. Informed patient that the next steps will be to get a PET scan and a referral to pulmonary. Patient expressed understand of this.

## 2020-04-28 NOTE — Telephone Encounter (Signed)
Truman Hayward with [facility] called to report results of CT Chest done yesterday:  IMPRESSION: 1. Lung-RADS 4A, suspicious. 12.4 mm spiculated left upper lobe pulmonary nodule. Given the size and appearance of this nodule, follow-up PET-CT recommended. Pulmonology or Thoracic Surgery recommended. 2. Aortic Atherosclerosis (ICD10-I70.0) and Emphysema (ICD10-J43.9).

## 2020-05-11 ENCOUNTER — Encounter: Payer: Self-pay | Admitting: *Deleted

## 2020-05-12 DIAGNOSIS — Z23 Encounter for immunization: Secondary | ICD-10-CM | POA: Diagnosis not present

## 2020-05-19 ENCOUNTER — Ambulatory Visit (HOSPITAL_COMMUNITY)
Admission: RE | Admit: 2020-05-19 | Discharge: 2020-05-19 | Disposition: A | Discharge status: Home / Self Care | Payer: Medicare Other | Source: Ambulatory Visit | Attending: Internal Medicine | Admitting: Internal Medicine

## 2020-05-19 ENCOUNTER — Other Ambulatory Visit: Payer: Self-pay

## 2020-05-19 DIAGNOSIS — R911 Solitary pulmonary nodule: Secondary | ICD-10-CM

## 2020-05-19 DIAGNOSIS — J320 Chronic maxillary sinusitis: Secondary | ICD-10-CM | POA: Insufficient documentation

## 2020-05-19 DIAGNOSIS — K5730 Diverticulosis of large intestine without perforation or abscess without bleeding: Secondary | ICD-10-CM | POA: Diagnosis not present

## 2020-05-19 DIAGNOSIS — J439 Emphysema, unspecified: Secondary | ICD-10-CM | POA: Diagnosis not present

## 2020-05-19 DIAGNOSIS — I2510 Atherosclerotic heart disease of native coronary artery without angina pectoris: Secondary | ICD-10-CM | POA: Diagnosis not present

## 2020-05-19 DIAGNOSIS — I700 Atherosclerosis of aorta: Secondary | ICD-10-CM | POA: Diagnosis not present

## 2020-05-19 DIAGNOSIS — S2220XA Unspecified fracture of sternum, initial encounter for closed fracture: Secondary | ICD-10-CM | POA: Diagnosis not present

## 2020-05-19 LAB — GLUCOSE, CAPILLARY: Glucose-Capillary: 100 mg/dL — ABNORMAL HIGH (ref 70–99)

## 2020-05-19 MED ORDER — FLUDEOXYGLUCOSE F - 18 (FDG) INJECTION
6.9700 | Freq: Once | INTRAVENOUS | Status: AC | PRN
  Administered 2020-05-19: 6.97 via INTRAVENOUS

## 2020-05-22 NOTE — Progress Notes (Signed)
Hi Marissa, this patient's recent PET scan showed a lung malignancy. I called him to discuss the results of his CT scan about 2 weeks ago before ordering the PET scan. At the time, I told him there was a high chance based on the size and his smoking hx that the nodule could be cancerous but we need the PET scan to confirm. 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 I asked the front desk to schedule an appt for him this week so you can discuss the results with  him and refer him to oncology or radiology for biopsy first. Let me know if you have any questions. Thanks!!!

## 2020-05-24 ENCOUNTER — Telehealth: Payer: Self-pay | Admitting: Student

## 2020-05-24 NOTE — Telephone Encounter (Signed)
-----   Message from Lacinda Axon, MD sent at 05/22/2020  8:05 PM EDT ----- Regarding: Follow up appointment Hello, please call and schedule an appt for this patient later this week with the red team, preferably Dr. Sherry Ruffing to discuss imaging results. Thanks.  ~Dr. Coy Saunas

## 2020-05-24 NOTE — Telephone Encounter (Signed)
Please refer to message below.  Patient contact via telephone for an appointment.  Agreed to come in on 05/25/2020 at 1:15 pm.  Forwarding message back to [employer].

## 2020-05-25 ENCOUNTER — Other Ambulatory Visit: Payer: Self-pay

## 2020-05-25 ENCOUNTER — Ambulatory Visit (INDEPENDENT_AMBULATORY_CARE_PROVIDER_SITE_OTHER): Payer: Medicare Other | Admitting: Internal Medicine

## 2020-05-25 ENCOUNTER — Encounter: Payer: Self-pay | Admitting: Internal Medicine

## 2020-05-25 VITALS — BP 118/66 | HR 103 | Temp 97.9°F | Wt 159.1 lb

## 2020-05-25 DIAGNOSIS — J302 Other seasonal allergic rhinitis: Secondary | ICD-10-CM | POA: Diagnosis not present

## 2020-05-25 DIAGNOSIS — C3492 Malignant neoplasm of unspecified part of left bronchus or lung: Secondary | ICD-10-CM | POA: Diagnosis not present

## 2020-05-25 DIAGNOSIS — Z720 Tobacco use: Secondary | ICD-10-CM

## 2020-05-25 DIAGNOSIS — J309 Allergic rhinitis, unspecified: Secondary | ICD-10-CM | POA: Insufficient documentation

## 2020-05-25 DIAGNOSIS — R911 Solitary pulmonary nodule: Secondary | ICD-10-CM

## 2020-05-25 DIAGNOSIS — C3490 Malignant neoplasm of unspecified part of unspecified bronchus or lung: Secondary | ICD-10-CM | POA: Insufficient documentation

## 2020-05-25 DIAGNOSIS — C3412 Malignant neoplasm of upper lobe, left bronchus or lung: Secondary | ICD-10-CM | POA: Insufficient documentation

## 2020-05-25 DIAGNOSIS — Z23 Encounter for immunization: Secondary | ICD-10-CM

## 2020-05-25 MED ORDER — FLUTICASONE PROPIONATE 50 MCG/ACT NA SUSP: 1.0000 | Freq: Every day | NASAL | 2 refills | Status: DC
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
## 2020-05-25 NOTE — Assessment & Plan Note (Signed)
Patient reports that he has been having a mild left frontal headache for the past few days, as well as some nasal drainage, states that he has this frequently around this time of year.  He has some redness on lower portion of his left eye, states that it has been present ever since his cataract surgery, and that it has improved.  Chart review it appears he has a history of anterior scleritis status post cataract surgery in 2019, appears to be stable, last saw opthalmology 04/10/20. On exam he has some left eye lower conjunctival hemorrhage, mild tenderness to palpation over lateral maxillary sinus, no obvious nasal drainage. Seasonality symptoms of nasal drainage and headache could be consistent with allergic rhinitis.  -Flonase daily

## 2020-05-25 NOTE — Patient Instructions (Addendum)
[name],  It was a pleasure to see you today. Thank you for coming in.   Today we discussed your imaging results. I am sorry to say that It is consistent with cancer. I have placed a referral to the cancer doctor (oncology). You will need to have a biopsy to further evaluate this. Please let me know if you have any questions or concerns.    We also discussed your nasal drainage and headache. Please start using flonase daily, this may help with your symptoms.   You received the 2nd pneumonia vaccine today.   Please return to clinic in 2 months or sooner if needed.   Thank you again for coming in.   Asencion Noble.D.

## 2020-05-25 NOTE — Assessment & Plan Note (Signed)
Patient has a 45-pack-year smoking history, he reports his albuterol has been helping with his breathing.  Ports that his breathing is doing well, is able to walk up a flight of stairs and at least 2 blocks without stopping due to shortness of breath.  He had a CT chest on 10/7 that showed lungs RADS 4A, suspicious, 12.4 mm spiculated left upper lobe pulmonary nodule, recommended follow-up PET scan.  PET scan obtained on 10/29 showed hypermetabolic spiculated left apical nodule, maximum SUV 5.9, compatible with malignancy.  We discussed these results today.  Discussed referral to oncology for further evaluation and biopsy evaluation.  He reports that he lost his mother-in-law and wife approximately 9 years ago, does have his daughters and son in the area who is able to provide support.    -Oncology referral

## 2020-05-29 ENCOUNTER — Telehealth: Payer: Self-pay | Admitting: *Deleted

## 2020-05-29 NOTE — Telephone Encounter (Signed)
I called and spoke to Jason Carter.  He was referred and I called to scheduled him to be seen at [facility].  He verbalized understanding of appt time and place.

## 2020-05-29 NOTE — Progress Notes (Signed)
Internal Medicine Clinic Attending ° °Case discussed with Dr. Krienke  At the time of the visit.  We reviewed the resident’s history and exam and pertinent patient test results.  I agree with the assessment, diagnosis, and plan of care documented in the resident’s note.  °

## 2020-06-08 ENCOUNTER — Other Ambulatory Visit: Payer: Self-pay

## 2020-06-08 ENCOUNTER — Institutional Professional Consult (permissible substitution) (INDEPENDENT_AMBULATORY_CARE_PROVIDER_SITE_OTHER): Payer: Medicare Other | Admitting: Cardiothoracic Surgery

## 2020-06-08 ENCOUNTER — Ambulatory Visit
Admission: RE | Admit: 2020-06-08 | Discharge: 2020-06-08 | Disposition: A | Discharge status: Home / Self Care | Payer: Medicare Other | Source: Ambulatory Visit | Attending: Radiation Oncology | Admitting: Radiation Oncology

## 2020-06-08 VITALS — BP 163/77 | HR 94 | Temp 98.4°F | Resp 18 | Wt 159.5 lb

## 2020-06-08 DIAGNOSIS — Z87891 Personal history of nicotine dependence: Secondary | ICD-10-CM | POA: Diagnosis not present

## 2020-06-08 DIAGNOSIS — R918 Other nonspecific abnormal finding of lung field: Secondary | ICD-10-CM | POA: Diagnosis not present

## 2020-06-08 DIAGNOSIS — R0602 Shortness of breath: Secondary | ICD-10-CM | POA: Diagnosis not present

## 2020-06-08 DIAGNOSIS — C3412 Malignant neoplasm of upper lobe, left bronchus or lung: Secondary | ICD-10-CM

## 2020-06-08 NOTE — Addendum Note (Signed)
Encounter addended by: Cori Razor, RN on: 06/08/2020 4:27 PM  Actions taken: Order list changed, Order Reconciliation Section accessed, Home Medications modified, Medication List reviewed, Medication taking status modified
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
## 2020-06-09 ENCOUNTER — Other Ambulatory Visit: Payer: Self-pay | Admitting: Cardiothoracic Surgery

## 2020-06-09 DIAGNOSIS — C3412 Malignant neoplasm of upper lobe, left bronchus or lung: Secondary | ICD-10-CM

## 2020-06-12 NOTE — Addendum Note (Signed)
Addended by: Hulan Fray on: 06/12/2020 06:24 PM   Modules accepted: Orders

## 2020-06-21 ENCOUNTER — Other Ambulatory Visit (HOSPITAL_COMMUNITY)
Admission: RE | Admit: 2020-06-21 | Discharge: 2020-06-21 | Disposition: A | Discharge status: Home / Self Care | Payer: Medicare Other | Source: Ambulatory Visit | Attending: Cardiothoracic Surgery | Admitting: Cardiothoracic Surgery

## 2020-06-21 DIAGNOSIS — Z20822 Contact with and (suspected) exposure to covid-19: Secondary | ICD-10-CM | POA: Diagnosis not present

## 2020-06-21 DIAGNOSIS — Z01812 Encounter for preprocedural laboratory examination: Secondary | ICD-10-CM | POA: Insufficient documentation

## 2020-06-21 LAB — SARS CORONAVIRUS 2 (TAT 6-24 HRS): SARS Coronavirus 2: NEGATIVE

## 2020-06-23 ENCOUNTER — Ambulatory Visit (HOSPITAL_COMMUNITY)
Admission: RE | Admit: 2020-06-23 | Discharge: 2020-06-23 | Disposition: A | Discharge status: Home / Self Care | Payer: Medicare Other | Source: Ambulatory Visit | Attending: Cardiothoracic Surgery | Admitting: Cardiothoracic Surgery

## 2020-06-23 DIAGNOSIS — C3412 Malignant neoplasm of upper lobe, left bronchus or lung: Secondary | ICD-10-CM

## 2020-06-23 LAB — PULMONARY FUNCTION TEST
DL/VA % pred: 68 %
DL/VA: 2.75 ml/min/mmHg/L
DLCO unc % pred: 51 %
DLCO unc: 11.79 ml/min/mmHg
FEF 25-75 Post: 0.37 L/sec
FEF 25-75 Pre: 0.3 L/sec
FEF2575-%Change-Post: 21 %
FEF2575-%Pred-Post: 18 %
FEF2575-%Pred-Pre: 15 %
FEV1-%Change-Post: 8 %
FEV1-%Pred-Post: 36 %
FEV1-%Pred-Pre: 33 %
FEV1-Post: 0.99 L
FEV1-Pre: 0.91 L
FEV1FVC-%Change-Post: 7 %
FEV1FVC-%Pred-Pre: 53 %
FEV6-%Change-Post: 5 %
FEV6-%Pred-Post: 60 %
FEV6-%Pred-Pre: 56 %
FEV6-Post: 2.12 L
FEV6-Pre: 2 L
FEV6FVC-%Change-Post: 4 %
FEV6FVC-%Pred-Post: 95 %
FEV6FVC-%Pred-Pre: 91 %
FVC-%Change-Post: 1 %
FVC-%Pred-Post: 62 %
FVC-%Pred-Pre: 61 %
FVC-Post: 2.37 L
FVC-Pre: 2.34 L
Post FEV1/FVC ratio: 42 %
Post FEV6/FVC ratio: 90 %
Pre FEV1/FVC ratio: 39 %
Pre FEV6/FVC Ratio: 86 %
RV % pred: 149 %
RV: 3.54 L
TLC % pred: 94 %
TLC: 6.06 L

## 2020-06-23 MED ORDER — ALBUTEROL SULFATE (2.5 MG/3ML) 0.083% IN NEBU
2.5000 mg | INHALATION_SOLUTION | Freq: Once | RESPIRATORY_TRACT | Status: AC
  Administered 2020-06-23: 2.5 mg via RESPIRATORY_TRACT

## 2020-06-29 ENCOUNTER — Ambulatory Visit (INDEPENDENT_AMBULATORY_CARE_PROVIDER_SITE_OTHER): Payer: Medicare Other | Admitting: Cardiothoracic Surgery

## 2020-06-29 ENCOUNTER — Encounter: Payer: Self-pay | Admitting: Cardiothoracic Surgery

## 2020-06-29 ENCOUNTER — Other Ambulatory Visit: Payer: Self-pay

## 2020-06-29 ENCOUNTER — Other Ambulatory Visit: Payer: Self-pay | Admitting: *Deleted

## 2020-06-29 VITALS — BP 158/97 | HR 92 | Resp 20 | Ht 67.0 in

## 2020-06-29 DIAGNOSIS — R918 Other nonspecific abnormal finding of lung field: Secondary | ICD-10-CM
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
## 2020-06-29 NOTE — Progress Notes (Signed)
[id number] [address], [place] [address] Phone: [phone] Fax: [phone]  [facility] [place], [place] AT [address] [place] [address] Phone: [phone] Fax: [phone]      Your procedure is scheduled on Monday, December 13th.  Report to [facility] Main Entrance "A" at 5:30 A.M., and check in at the Admitting office.  Call this number if you have problems the morning of surgery:  [phone]  Call [phone] if you have any questions prior to your surgery date Monday-Friday 8am-4pm    Remember:  Do not eat after midnight the night before your surgery  You may drink clear liquids until 4:30 AM the morning of your surgery.   Clear liquids allowed are: Water, Non-Citrus Juices (without pulp), Carbonated Beverages, Clear Tea, Black Coffee Only, and Gatorade    Take these medicines the morning of surgery with A SIP OF WATER   Albuterol inhaler - if needed (bring with you on day of surgery)  Flonase nasal spray - if needed    As of today, STOP taking any Aspirin (unless otherwise instructed by your surgeon) Aleve, Naproxen, Ibuprofen, Motrin, Advil, Goody's, BC's, all herbal medications, fish oil, and all vitamins.                      Do not wear jewelry            Do not wear lotions, powders, colognes, or deodorant.            Men may shave face and neck.            Do not bring valuables to the hospital.            [facility] is not responsible for any belongings or valuables.  Do NOT Smoke (Tobacco/Vaping) or drink Alcohol 24 hours prior to your procedure If you use a CPAP at night, you may bring all equipment for your overnight stay.   Contacts, glasses, dentures or bridgework may not be worn into surgery.      For patients admitted to the hospital, discharge time will be  determined by your treatment team.   Patients discharged the day of surgery will not be allowed to drive home, and someone needs to stay with them for 24 hours.    Special instructions:   [facility]- Preparing For Surgery  Before surgery, you can play an important role. Because skin is not sterile, your skin needs to be as free of germs as possible. You can reduce the number of germs on your skin by washing with CHG (chlorahexidine gluconate) Soap before surgery.  CHG is an antiseptic cleaner which kills germs and bonds with the skin to continue killing germs even after washing.    Oral Hygiene is also important to reduce your risk of infection.  Remember - BRUSH YOUR TEETH THE MORNING OF SURGERY WITH YOUR REGULAR TOOTHPASTE  Please do not use if you have an allergy to CHG or antibacterial soaps. If your skin becomes reddened/irritated stop using the CHG.  Do not shave (including legs and underarms) for at least 48 hours prior to first CHG shower. It is OK to shave your face.  Please follow these instructions carefully.   1. Shower the NIGHT BEFORE SURGERY and the MORNING OF SURGERY with CHG  Soap.   2. If you chose to wash your hair, wash your hair first as usual with your normal shampoo.  3. After you shampoo, rinse your hair and body thoroughly to remove the shampoo.  4. Use CHG as you would any other liquid soap. You can apply CHG directly to the skin and wash gently with a scrungie or a clean washcloth.   5. Apply the CHG Soap to your body ONLY FROM THE NECK DOWN.  Do not use on open wounds or open sores. Avoid contact with your eyes, ears, mouth and genitals (private parts). Wash Face and genitals (private parts)  with your normal soap.   6. Wash thoroughly, paying special attention to the area where your surgery will be performed.  7. Thoroughly rinse your body with warm water from the neck down.  8. DO NOT shower/wash with your normal soap after using and rinsing off the CHG  Soap.  9. Pat yourself dry with a CLEAN TOWEL.  10. Wear CLEAN PAJAMAS to bed the night before surgery  11. Place CLEAN SHEETS on your bed the night of your first shower and DO NOT SLEEP WITH PETS.   Day of Surgery: Wear Clean/Comfortable clothing the morning of surgery Do not apply any deodorants/lotions.   Remember to brush your teeth WITH YOUR REGULAR TOOTHPASTE.   Please read over the following fact sheets that you were given.

## 2020-06-30 ENCOUNTER — Encounter (HOSPITAL_COMMUNITY)
Admission: RE | Admit: 2020-06-30 | Discharge: 2020-06-30 | Disposition: A | Discharge status: Home / Self Care | Payer: Medicare Other | Source: Ambulatory Visit | Attending: Cardiothoracic Surgery | Admitting: Cardiothoracic Surgery

## 2020-06-30 ENCOUNTER — Other Ambulatory Visit: Payer: Self-pay

## 2020-06-30 ENCOUNTER — Other Ambulatory Visit (HOSPITAL_COMMUNITY)
Admission: RE | Admit: 2020-06-30 | Discharge: 2020-06-30 | Disposition: A | Discharge status: Home / Self Care | Payer: Medicare Other | Source: Ambulatory Visit | Attending: Cardiothoracic Surgery | Admitting: Cardiothoracic Surgery

## 2020-06-30 ENCOUNTER — Other Ambulatory Visit: Payer: Self-pay | Admitting: Cardiothoracic Surgery

## 2020-06-30 ENCOUNTER — Ambulatory Visit
Admission: RE | Admit: 2020-06-30 | Discharge: 2020-06-30 | Disposition: A | Discharge status: Home / Self Care | Payer: Medicare Other | Source: Ambulatory Visit | Attending: Cardiothoracic Surgery | Admitting: Cardiothoracic Surgery

## 2020-06-30 ENCOUNTER — Encounter (HOSPITAL_COMMUNITY): Payer: Self-pay

## 2020-06-30 DIAGNOSIS — J90 Pleural effusion, not elsewhere classified: Secondary | ICD-10-CM | POA: Diagnosis not present

## 2020-06-30 DIAGNOSIS — Z01818 Encounter for other preprocedural examination: Secondary | ICD-10-CM | POA: Insufficient documentation

## 2020-06-30 DIAGNOSIS — Z01812 Encounter for preprocedural laboratory examination: Secondary | ICD-10-CM | POA: Insufficient documentation

## 2020-06-30 DIAGNOSIS — Z20822 Contact with and (suspected) exposure to covid-19: Secondary | ICD-10-CM | POA: Insufficient documentation

## 2020-06-30 DIAGNOSIS — R918 Other nonspecific abnormal finding of lung field: Secondary | ICD-10-CM | POA: Insufficient documentation

## 2020-06-30 DIAGNOSIS — R911 Solitary pulmonary nodule: Secondary | ICD-10-CM | POA: Diagnosis not present

## 2020-06-30 DIAGNOSIS — J984 Other disorders of lung: Secondary | ICD-10-CM | POA: Diagnosis not present

## 2020-06-30 DIAGNOSIS — I2510 Atherosclerotic heart disease of native coronary artery without angina pectoris: Secondary | ICD-10-CM | POA: Diagnosis not present

## 2020-06-30 DIAGNOSIS — J439 Emphysema, unspecified: Secondary | ICD-10-CM | POA: Diagnosis not present

## 2020-06-30 HISTORY — DX: Shortness of breath: R06.02

## 2020-06-30 LAB — COMPREHENSIVE METABOLIC PANEL
ALT: 18 U/L (ref 0–44)
AST: 18 U/L (ref 15–41)
Albumin: 3.8 g/dL (ref 3.5–5.0)
Alkaline Phosphatase: 52 U/L (ref 38–126)
Anion gap: 9 (ref 5–15)
BUN: 9 mg/dL (ref 8–23)
CO2: 29 mmol/L (ref 22–32)
Calcium: 9.4 mg/dL (ref 8.9–10.3)
Chloride: 99 mmol/L (ref 98–111)
Creatinine, Ser: 0.66 mg/dL (ref 0.61–1.24)
GFR, Estimated: >60 mL/min (ref >60)
Glucose, Bld: 95 mg/dL (ref 70–99)
Potassium: 4.1 mmol/L (ref 3.5–5.1)
Sodium: 137 mmol/L (ref 135–145)
Total Bilirubin: 0.9 mg/dL (ref 0.3–1.2)
Total Protein: 6.9 g/dL (ref 6.5–8.1)

## 2020-06-30 LAB — PROTIME-INR
INR: 1 (ref 0.8–1.2)
Prothrombin Time: 12.9 seconds (ref 11.4–15.2)

## 2020-06-30 LAB — CBC
HCT: 44 % (ref 39.0–52.0)
Hemoglobin: 13.5 g/dL (ref 13.0–17.0)
MCH: 28.7 pg (ref 26.0–34.0)
MCHC: 30.7 g/dL (ref 30.0–36.0)
MCV: 93.6 fL (ref 80.0–100.0)
Platelets: 212 10*3/uL (ref 150–400)
RBC: 4.7 MIL/uL (ref 4.22–5.81)
RDW: 13.3 % (ref 11.5–15.5)
WBC: 8.9 10*3/uL (ref 4.0–10.5)
nRBC: 0 % (ref 0.0–0.2)

## 2020-06-30 LAB — SARS CORONAVIRUS 2 (TAT 6-24 HRS): SARS Coronavirus 2: NEGATIVE

## 2020-06-30 LAB — APTT: aPTT: 31 seconds (ref 24–36)

## 2020-06-30 NOTE — Progress Notes (Signed)
PCP - Dr. Cato Mulligan Cardiologist - denies  PPM/ICD - denies  Chest x-ray - 06/30/2020 EKG - N/A Stress Test - denies  ECHO - denies Cardiac Cath - denies  Sleep Study - denies CPAP - N/A  DM: denies  Blood Thinner Instructions: N/A Aspirin Instructions: N/A  ERAS Protcol - No  COVID TEST- Scheduled for today 06/30/2020. Patient verbalized understanding of self-quarantine instructions, appointment time and place.  Anesthesia review: No  Patient denies shortness of breath, fever, cough and chest pain at PAT appointment  All instructions explained to the patient, with a verbal understanding of the material. Patient agrees to go over the instructions while at home for a better understanding. Patient also instructed to self quarantine after being tested for COVID-19. The opportunity to ask questions was provided.

## 2020-06-30 NOTE — Progress Notes (Signed)
Your procedure is scheduled on Monday, December 13th.  Report to [facility] Main Entrance "A" at 5:30 A.M., and check in at the Admitting office.  Call this number if you have problems the morning of surgery:  [phone]  Call [phone] if you have any questions prior to your surgery date Monday-Friday 8am-4pm   Remember:  Do not eat or drink after midnight the night before your surgery    Take these medicines the morning of surgery with A SIP OF WATER   Albuterol inhaler - if needed (bring with you on day of surgery)  Flonase nasal spray - if needed   As of today, STOP taking any Aspirin (unless otherwise instructed by your surgeon) Aleve, Naproxen, Ibuprofen, Motrin, Advil, Goody's, BC's, all herbal medications, fish oil, and all vitamins.                     Do not wear jewelry            Do not wear lotions, powders, colognes, or deodorant.            Men may shave face and neck.            Do not bring valuables to the hospital.            [facility] is not responsible for any belongings or valuables.  Do NOT Smoke (Tobacco/Vaping) or drink Alcohol 24 hours prior to your procedure If you use a CPAP at night, you may bring all equipment for your overnight stay.   Contacts, glasses, dentures or bridgework may not be worn into surgery.      For patients admitted to the hospital, discharge time will be determined by your treatment team.   Patients discharged the day of surgery will not be allowed to drive home, and someone needs to stay with them for 24 hours.    Special instructions:   [facility]- Preparing For Surgery  Before surgery, you can play an important role. Because skin is not sterile, your skin needs to be as free of germs as possible. You can reduce the number of germs on your skin by washing with CHG (chlorahexidine gluconate) Soap before surgery.  CHG is an antiseptic cleaner which kills germs and bonds with the skin to continue killing germs even after  washing.    Oral Hygiene is also important to reduce your risk of infection.  Remember - BRUSH YOUR TEETH THE MORNING OF SURGERY WITH YOUR REGULAR TOOTHPASTE  Please do not use if you have an allergy to CHG or antibacterial soaps. If your skin becomes reddened/irritated stop using the CHG.  Do not shave (including legs and underarms) for at least 48 hours prior to first CHG shower. It is OK to shave your face.  Please follow these instructions carefully.   1. Shower the NIGHT BEFORE SURGERY and the MORNING OF SURGERY with CHG Soap.   2. If you chose to wash your hair, wash your hair first as usual with your normal shampoo.  3. After you shampoo, rinse your hair and body thoroughly to remove the shampoo.  4. Use CHG as you would any other liquid soap. You can apply CHG directly to the skin and wash gently with a scrungie or a clean washcloth.   5. Apply the CHG Soap to your body ONLY FROM THE NECK DOWN.  Do not use on open wounds or open sores. Avoid contact with your eyes, ears, mouth and genitals (private  parts). Wash Face and genitals (private parts)  with your normal soap.   6. Wash thoroughly, paying special attention to the area where your surgery will be performed.  7. Thoroughly rinse your body with warm water from the neck down.  8. DO NOT shower/wash with your normal soap after using and rinsing off the CHG Soap.  9. Pat yourself dry with a CLEAN TOWEL.  10. Wear CLEAN PAJAMAS to bed the night before surgery  11. Place CLEAN SHEETS on your bed the night of your first shower and DO NOT SLEEP WITH PETS.   Day of Surgery: Wear Clean/Comfortable clothing the morning of surgery Do not apply any deodorants/lotions.   Remember to brush your teeth WITH YOUR REGULAR TOOTHPASTE.   Please read over the following fact sheets that you were given.

## 2020-07-02 NOTE — Anesthesia Preprocedure Evaluation (Addendum)
Anesthesia Evaluation  Patient identified by MRN, date of birth, ID band Patient awake    Reviewed: Allergy & Precautions, NPO status , Patient's Chart, lab work & pertinent test results  Airway Mallampati: II  TM Distance: >3 FB Neck ROM: Full    Dental  (+) Edentulous Upper   Pulmonary former smoker,    Pulmonary exam normal breath sounds clear to auscultation       Cardiovascular negative cardio ROS Normal cardiovascular exam Rhythm:Regular Rate:Normal     Neuro/Psych negative neurological ROS  negative psych ROS   GI/Hepatic negative GI ROS, Neg liver ROS,   Endo/Other  negative endocrine ROS  Renal/GU negative Renal ROS     Musculoskeletal Gout   Abdominal   Peds  Hematology negative hematology ROS (+)   Anesthesia Other Findings lung mass  Reproductive/Obstetrics                            Anesthesia Physical Anesthesia Plan  ASA: II  Anesthesia Plan: General   Post-op Pain Management:    Induction: Intravenous  PONV Risk Score and Plan: 2 and Ondansetron, Dexamethasone and Treatment may vary due to age or medical condition  Airway Management Planned: Oral ETT  Additional Equipment:   Intra-op Plan:   Post-operative Plan: Extubation in OR  Informed Consent: I have reviewed the patients History and Physical, chart, labs and discussed the procedure including the risks, benefits and alternatives for the proposed anesthesia with the patient or authorized representative who has indicated his/her understanding and acceptance.     Dental advisory given  Plan Discussed with: CRNA  Anesthesia Plan Comments:        Anesthesia Quick Evaluation

## 2020-07-03 ENCOUNTER — Ambulatory Visit (HOSPITAL_COMMUNITY)
Admission: RE | Admit: 2020-07-03 | Discharge: 2020-07-03 | Disposition: A | Discharge status: Home / Self Care | Payer: Medicare Other | Attending: Cardiothoracic Surgery | Admitting: Cardiothoracic Surgery

## 2020-07-03 ENCOUNTER — Ambulatory Visit (HOSPITAL_COMMUNITY): Payer: Medicare Other | Admitting: Anesthesiology

## 2020-07-03 ENCOUNTER — Encounter (HOSPITAL_COMMUNITY)
Admission: RE | Disposition: A | Discharge status: Home / Self Care | Payer: Self-pay | Source: Home / Self Care | Attending: Cardiothoracic Surgery

## 2020-07-03 ENCOUNTER — Encounter (HOSPITAL_COMMUNITY): Payer: Self-pay | Admitting: Cardiothoracic Surgery

## 2020-07-03 ENCOUNTER — Ambulatory Visit (HOSPITAL_COMMUNITY): Payer: Medicare Other

## 2020-07-03 ENCOUNTER — Other Ambulatory Visit: Payer: Self-pay

## 2020-07-03 DIAGNOSIS — J441 Chronic obstructive pulmonary disease with (acute) exacerbation: Secondary | ICD-10-CM | POA: Insufficient documentation

## 2020-07-03 DIAGNOSIS — Z8249 Family history of ischemic heart disease and other diseases of the circulatory system: Secondary | ICD-10-CM | POA: Diagnosis not present

## 2020-07-03 DIAGNOSIS — R7303 Prediabetes: Secondary | ICD-10-CM | POA: Diagnosis not present

## 2020-07-03 DIAGNOSIS — Z09 Encounter for follow-up examination after completed treatment for conditions other than malignant neoplasm: Secondary | ICD-10-CM

## 2020-07-03 DIAGNOSIS — R918 Other nonspecific abnormal finding of lung field: Secondary | ICD-10-CM | POA: Diagnosis not present

## 2020-07-03 DIAGNOSIS — Z87891 Personal history of nicotine dependence: Secondary | ICD-10-CM | POA: Insufficient documentation

## 2020-07-03 DIAGNOSIS — I700 Atherosclerosis of aorta: Secondary | ICD-10-CM | POA: Diagnosis not present

## 2020-07-03 DIAGNOSIS — I2510 Atherosclerotic heart disease of native coronary artery without angina pectoris: Secondary | ICD-10-CM | POA: Insufficient documentation

## 2020-07-03 DIAGNOSIS — R911 Solitary pulmonary nodule: Secondary | ICD-10-CM

## 2020-07-03 DIAGNOSIS — Z836 Family history of other diseases of the respiratory system: Secondary | ICD-10-CM | POA: Insufficient documentation

## 2020-07-03 DIAGNOSIS — C3412 Malignant neoplasm of upper lobe, left bronchus or lung: Secondary | ICD-10-CM | POA: Diagnosis not present

## 2020-07-03 DIAGNOSIS — Z419 Encounter for procedure for purposes other than remedying health state, unspecified: Secondary | ICD-10-CM

## 2020-07-03 DIAGNOSIS — M1A9XX0 Chronic gout, unspecified, without tophus (tophi): Secondary | ICD-10-CM | POA: Diagnosis not present

## 2020-07-03 DIAGNOSIS — Z8379 Family history of other diseases of the digestive system: Secondary | ICD-10-CM | POA: Diagnosis not present

## 2020-07-03 DIAGNOSIS — J439 Emphysema, unspecified: Secondary | ICD-10-CM | POA: Diagnosis not present

## 2020-07-03 DIAGNOSIS — I10 Essential (primary) hypertension: Secondary | ICD-10-CM | POA: Diagnosis not present

## 2020-07-03 HISTORY — PX: FUDUCIAL PLACEMENT: SHX5083

## 2020-07-03 HISTORY — PX: VIDEO BRONCHOSCOPY WITH ENDOBRONCHIAL NAVIGATION: SHX6175

## 2020-07-03 HISTORY — PX: LUNG BIOPSY: SHX5088

## 2020-07-03 LAB — GLUCOSE, CAPILLARY: Glucose-Capillary: 99 mg/dL (ref 70–99)

## 2020-07-03 SURGERY — VIDEO BRONCHOSCOPY WITH ENDOBRONCHIAL NAVIGATION
Anesthesia: General

## 2020-07-03 MED ORDER — PHENYLEPHRINE 40 MCG/ML (10ML) SYRINGE FOR IV PUSH (FOR BLOOD PRESSURE SUPPORT)
PREFILLED_SYRINGE | INTRAVENOUS | Status: DC | PRN
  Administered 2020-07-03: 80 ug via INTRAVENOUS
  Administered 2020-07-03 (×2): 120 ug via INTRAVENOUS
  Administered 2020-07-03 (×2): 80 ug via INTRAVENOUS

## 2020-07-03 MED ORDER — FENTANYL CITRATE (PF) 100 MCG/2ML IJ SOLN
INTRAMUSCULAR | Status: DC | PRN
  Administered 2020-07-03: 150 ug via INTRAVENOUS
  Administered 2020-07-03: 25 ug via INTRAVENOUS

## 2020-07-03 MED ORDER — EPINEPHRINE PF 1 MG/ML IJ SOLN
INTRAMUSCULAR | Status: AC
  Filled 2020-07-03: qty 1

## 2020-07-03 MED ORDER — ONDANSETRON HCL 4 MG/2ML IJ SOLN
INTRAMUSCULAR | Status: AC
  Filled 2020-07-03: qty 2

## 2020-07-03 MED ORDER — LACTATED RINGERS IV SOLN: INTRAVENOUS | Status: DC

## 2020-07-03 MED ORDER — PROPOFOL 10 MG/ML IV BOLUS
INTRAVENOUS | Status: DC | PRN
  Administered 2020-07-03: 30 mg via INTRAVENOUS
  Administered 2020-07-03: 150 mg via INTRAVENOUS

## 2020-07-03 MED ORDER — FENTANYL CITRATE (PF) 250 MCG/5ML IJ SOLN
INTRAMUSCULAR | Status: AC
  Filled 2020-07-03: qty 5

## 2020-07-03 MED ORDER — ROCURONIUM BROMIDE 10 MG/ML (PF) SYRINGE
PREFILLED_SYRINGE | INTRAVENOUS | Status: AC
  Filled 2020-07-03: qty 10

## 2020-07-03 MED ORDER — ONDANSETRON HCL 4 MG/2ML IJ SOLN
INTRAMUSCULAR | Status: DC | PRN
  Administered 2020-07-03: 4 mg via INTRAVENOUS

## 2020-07-03 MED ORDER — DEXAMETHASONE SODIUM PHOSPHATE 10 MG/ML IJ SOLN
INTRAMUSCULAR | Status: AC
  Filled 2020-07-03: qty 1

## 2020-07-03 MED ORDER — EPHEDRINE SULFATE-NACL 50-0.9 MG/10ML-% IV SOSY
PREFILLED_SYRINGE | INTRAVENOUS | Status: DC | PRN
  Administered 2020-07-03: 10 mg via INTRAVENOUS

## 2020-07-03 MED ORDER — FENTANYL CITRATE (PF) 100 MCG/2ML IJ SOLN: 25.0000 ug | INTRAMUSCULAR | Status: DC | PRN

## 2020-07-03 MED ORDER — PROPOFOL 10 MG/ML IV BOLUS
INTRAVENOUS | Status: AC
  Filled 2020-07-03: qty 20

## 2020-07-03 MED ORDER — LIDOCAINE HCL (PF) 2 % IJ SOLN
INTRAMUSCULAR | Status: AC
  Filled 2020-07-03: qty 5

## 2020-07-03 MED ORDER — 0.9 % SODIUM CHLORIDE (POUR BTL) OPTIME
TOPICAL | Status: DC | PRN
  Administered 2020-07-03: 1000 mL

## 2020-07-03 MED ORDER — EPINEPHRINE PF 1 MG/ML IJ SOLN
INTRAMUSCULAR | Status: DC | PRN
  Administered 2020-07-03: 1 mg

## 2020-07-03 MED ORDER — PROPOFOL 10 MG/ML IV BOLUS
INTRAVENOUS | Status: AC
  Filled 2020-07-03: qty 40

## 2020-07-03 MED ORDER — CHLORHEXIDINE GLUCONATE 0.12 % MT SOLN
15.0000 mL | Freq: Once | OROMUCOSAL | Status: AC
  Administered 2020-07-03: 15 mL via OROMUCOSAL
  Filled 2020-07-03: qty 15

## 2020-07-03 MED ORDER — ROCURONIUM BROMIDE 10 MG/ML (PF) SYRINGE
PREFILLED_SYRINGE | INTRAVENOUS | Status: DC | PRN
  Administered 2020-07-03: 30 mg via INTRAVENOUS
  Administered 2020-07-03: 40 mg via INTRAVENOUS

## 2020-07-03 MED ORDER — DEXAMETHASONE SODIUM PHOSPHATE 10 MG/ML IJ SOLN
INTRAMUSCULAR | Status: DC | PRN
  Administered 2020-07-03: 10 mg via INTRAVENOUS

## 2020-07-03 MED ORDER — ACETAMINOPHEN 500 MG PO TABS
1000.0000 mg | ORAL_TABLET | Freq: Once | ORAL | Status: AC
  Administered 2020-07-03: 1000 mg via ORAL
  Filled 2020-07-03: qty 2

## 2020-07-03 MED ORDER — LIDOCAINE 2% (20 MG/ML) 5 ML SYRINGE
INTRAMUSCULAR | Status: DC | PRN
  Administered 2020-07-03: 60 mg via INTRAVENOUS

## 2020-07-03 MED ORDER — PHENYLEPHRINE 40 MCG/ML (10ML) SYRINGE FOR IV PUSH (FOR BLOOD PRESSURE SUPPORT)
PREFILLED_SYRINGE | INTRAVENOUS | Status: AC
  Filled 2020-07-03: qty 20

## 2020-07-03 MED ORDER — ORAL CARE MOUTH RINSE: 15.0000 mL | Freq: Once | OROMUCOSAL | Status: AC

## 2020-07-03 MED ORDER — PHENYLEPHRINE HCL-NACL 20-0.9 MG/250ML-% IV SOLN
INTRAVENOUS | Status: DC | PRN
  Administered 2020-07-03: 25 ug/min via INTRAVENOUS

## 2020-07-03 MED ORDER — SUGAMMADEX SODIUM 200 MG/2ML IV SOLN
INTRAVENOUS | Status: DC | PRN
  Administered 2020-07-03: 200 mg via INTRAVENOUS

## 2020-07-03 MED ORDER — ONDANSETRON HCL 4 MG/2ML IJ SOLN: 4.0000 mg | Freq: Once | INTRAMUSCULAR | Status: DC | PRN

## 2020-07-03 SURGICAL SUPPLY — 49 items
ADAPTER BRONCHOSCOPE OLYMPUS (ADAPTER) ×2 IMPLANT
ADAPTER VALVE BIOPSY EBUS (MISCELLANEOUS) IMPLANT
ADPR BSCP OLMPS EDG (ADAPTER) ×1
ADPTR VALVE BIOPSY EBUS (MISCELLANEOUS)
BLADE CLIPPER SURG (BLADE) ×1 IMPLANT
BRUSH BIOPSY BRONCH 10 SDTNB (MISCELLANEOUS) ×1 IMPLANT
BRUSH SUPERTRAX BIOPSY (INSTRUMENTS) IMPLANT
BRUSH SUPERTRAX NDL-TIP CYTO (INSTRUMENTS) ×1 IMPLANT
CANISTER SUCT 3000ML PPV (MISCELLANEOUS) ×2 IMPLANT
CNTNR URN SCR LID CUP LEK RST (MISCELLANEOUS) ×2 IMPLANT
CONT SPEC 4OZ STRL OR WHT (MISCELLANEOUS) ×2
COVER BACK TABLE 60X90IN (DRAPES) ×2 IMPLANT
FILTER SMOKE EVAC ULPA (FILTER) ×1 IMPLANT
FILTER STRAW FLUID ASPIR (MISCELLANEOUS) IMPLANT
FORCEPS BIOP SUPERTRX PREMAR (INSTRUMENTS) ×1 IMPLANT
GAUZE SPONGE 4X4 12PLY STRL (GAUZE/BANDAGES/DRESSINGS) ×2 IMPLANT
GLOVE BIO SURGEON STRL SZ 6.5 (GLOVE) ×3 IMPLANT
GLOVE SURG UNDER POLY LF SZ6.5 (GLOVE) ×2 IMPLANT
GOWN STRL REUS W/ TWL LRG LVL3 (GOWN DISPOSABLE) ×2 IMPLANT
GOWN STRL REUS W/TWL LRG LVL3 (GOWN DISPOSABLE) ×6
KIT CLEAN ENDO COMPLIANCE (KITS) ×2 IMPLANT
KIT ILLUMISITE 180 PROCEDURE (KITS) ×1 IMPLANT
KIT ILLUMISITE 90 PROCEDURE (KITS) IMPLANT
KIT MARKER FIDUCIAL DELIVERY (KITS) ×1 IMPLANT
KIT TURNOVER KIT B (KITS) ×2 IMPLANT
MARKER FIDUCIAL SL NIT COIL (Implant Marker) ×3 IMPLANT
MARKER PEN SURG W/LABELS BLK (STERILIZATION PRODUCTS) ×1 IMPLANT
MARKER SKIN DUAL TIP RULER LAB (MISCELLANEOUS) ×2 IMPLANT
NDL SUPERTRX PREMARK BIOPSY (NEEDLE) IMPLANT
NEEDLE SUPERTRX PREMARK BIOPSY (NEEDLE) ×2 IMPLANT
NS IRRIG 1000ML POUR BTL (IV SOLUTION) ×2 IMPLANT
OIL SILICONE PENTAX (PARTS (SERVICE/REPAIRS)) ×2 IMPLANT
PAD ARMBOARD 7.5X6 YLW CONV (MISCELLANEOUS) ×4 IMPLANT
PATCHES PATIENT (LABEL) ×6 IMPLANT
PENCIL SMOKE EVACUATOR (MISCELLANEOUS) ×1 IMPLANT
SLEEVE SUCTION 125 (MISCELLANEOUS) ×2 IMPLANT
SYR 20ML ECCENTRIC (SYRINGE) ×2 IMPLANT
SYR 20ML LL LF (SYRINGE) ×2 IMPLANT
SYR 3ML LL SCALE MARK (SYRINGE) ×1 IMPLANT
TOWEL GREEN STERILE (TOWEL DISPOSABLE) ×1 IMPLANT
TOWEL GREEN STERILE FF (TOWEL DISPOSABLE) ×2 IMPLANT
TRAP FLUID SMOKE EVACUATOR (MISCELLANEOUS) ×1 IMPLANT
TRAP SPECIMEN MUCUS 40CC (MISCELLANEOUS) ×2 IMPLANT
TUBE CONNECTING 20X1/4 (TUBING) ×2 IMPLANT
UNDERPAD 30X36 HEAVY ABSORB (UNDERPADS AND DIAPERS) ×2 IMPLANT
VALVE BIOPSY  SINGLE USE (MISCELLANEOUS) ×2
VALVE BIOPSY SINGLE USE (MISCELLANEOUS) ×1 IMPLANT
VALVE SUCTION BRONCHIO DISP (MISCELLANEOUS) ×2 IMPLANT
WATER STERILE IRR 1000ML POUR (IV SOLUTION) ×1 IMPLANT

## 2020-07-03 NOTE — Anesthesia Postprocedure Evaluation (Signed)
Anesthesia Post Note  Patient: Mikell Kazlauskas  Procedure(s) Performed: VIDEO BRONCHOSCOPY WITH ENDOBRONCHIAL NAVIGATION (N/A ) LUNG BIOPSY (N/A ) PLACEMENT OF FUDUCIAL TIMES THREE. (N/A )     Patient location during evaluation: PACU Anesthesia Type: General Level of consciousness: awake and alert Pain management: pain level controlled Vital Signs Assessment: post-procedure vital signs reviewed and stable Respiratory status: spontaneous breathing, nonlabored ventilation, respiratory function stable and patient connected to nasal cannula oxygen Cardiovascular status: blood pressure returned to baseline and stable Postop Assessment: no apparent nausea or vomiting Anesthetic complications: no   No complications documented.  Last Vitals:  Vitals:   07/03/20 1020 07/03/20 1030  BP: 108/60 104/69  Pulse: 87 88  Resp: 11 12  Temp: 36.6 C 36.6 C  SpO2: 91% 92%    Last Pain:  Vitals:   07/03/20 1030  TempSrc:   PainSc: 0-No pain                 Belenda Cruise P Konstantin Lehnen

## 2020-07-03 NOTE — Anesthesia Procedure Notes (Addendum)
Procedure Name: Intubation Date/Time: 07/03/2020 7:44 AM Performed by: Colin Benton, CRNA Pre-anesthesia Checklist: Patient identified, Emergency Drugs available, Suction available and Patient being monitored Patient Re-evaluated:Patient Re-evaluated prior to induction Oxygen Delivery Method: Circle System Utilized Preoxygenation: Pre-oxygenation with 100% oxygen Induction Type: IV induction Ventilation: Mask ventilation without difficulty and Oral airway inserted - appropriate to patient size Laryngoscope Size: Sabra Heck and 3 Grade View: Grade I Tube type: Oral Tube size: 8.0 mm Number of attempts: 1 Airway Equipment and Method: Stylet and Oral airway Placement Confirmation: ETT inserted through vocal cords under direct vision,  positive ETCO2 and breath sounds checked- equal and bilateral Secured at: 23 cm Tube secured with: Tape Dental Injury: Teeth and Oropharynx as per pre-operative assessment

## 2020-07-03 NOTE — Brief Op Note (Signed)
      [address]       [place] [address]             [phone]     07/03/2020  11:38 AM  PATIENT:  Yves Dill  74 y.o. male  PRE-OPERATIVE DIAGNOSIS: left upper lobe  lung mass  POST-OPERATIVE DIAGNOSIS:  same   PROCEDURE:  Procedure(s): VIDEO BRONCHOSCOPY WITH ENDOBRONCHIAL NAVIGATION (N/A) LUNG BIOPSY (N/A) PLACEMENT OF FUDUCIAL TIMES THREE. (N/A)  SURGEON:  Surgeon(s) and Role:    * Grace Isaac, MD - Primary   ANESTHESIA:   general  EBL:  5 mL   BLOOD ADMINISTERED:none  DRAINS: none   LOCAL MEDICATIONS USED:  NONE  SPECIMEN:  Source of Specimen:  left upper lobe   DISPOSITION OF SPECIMEN:  PATHOLOGY  COUNTS:  YES DICTATION: .Dragon Dictation  PLAN OF CARE: Discharge to home after PACU  PATIENT DISPOSITION:  PACU - hemodynamically stable.   Delay start of Pharmacological VTE agent (>24hrs) due to surgical blood loss or risk of bleeding: yes
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
## 2020-07-03 NOTE — Transfer of Care (Signed)
Immediate Anesthesia Transfer of Care Note  Patient: Dyson Sevey  Procedure(s) Performed: VIDEO BRONCHOSCOPY WITH ENDOBRONCHIAL NAVIGATION (N/A ) LUNG BIOPSY (N/A ) PLACEMENT OF FUDUCIAL TIMES THREE. (N/A )  Patient Location: PACU  Anesthesia Type:General  Level of Consciousness: awake and patient cooperative  Airway & Oxygen Therapy: Patient Spontanous Breathing and Patient connected to face mask oxygen  Post-op Assessment: Report given to RN and Post -op Vital signs reviewed and stable  Post vital signs: Reviewed and stable  Last Vitals:  Vitals Value Taken Time  BP 132/74 07/03/20 0951  Temp    Pulse 99 07/03/20 0954  Resp 13 07/03/20 0954  SpO2 100 % 07/03/20 0954  Vitals shown include unvalidated device data.  Last Pain:  Vitals:   07/03/20 0614  TempSrc:   PainSc: 0-No pain      Patients Stated Pain Goal: 4 (84/73/08 5694)  Complications: No complications documented.

## 2020-07-03 NOTE — Discharge Instructions (Addendum)
Flexible Bronchoscopy, Care After This sheet gives you information about how to care for yourself after your procedure. Your health care provider may also give you more specific instructions. If you have problems or questions, contact your health care provider. What can I expect after the procedure? After the procedure, it is common to have the following symptoms for 24-48 hours:  A cough that is worse than it was before the procedure.  A low-grade fever.  A sore throat or hoarse voice.  Small streaks of blood in the mucus from your lungs (sputum), if tissue samples were removed (biopsy). Follow these instructions at home: Eating and drinking  Do not eat or drink anything (including water) for 2 hours after your procedure, or until your numbing medicine (local anesthetic) has worn off. Having a numb throat increases your risk of burning yourself or choking.  After your numbness is gone and your cough and gag reflexes have returned, you may start eating only soft foods and slowly drinking liquids.  The day after the procedure, return to your normal diet. Driving  Do not drive for 24 hours if you were given a medicine to help you relax (sedative).  Do not drive or use heavy machinery while taking prescription pain medicine. General instructions   Take over-the-counter and prescription medicines only as told by your health care provider.  Return to your normal activities as told by your health care provider. Ask your health care provider what activities are safe for you.  Do not use any products that contain nicotine or tobacco, such as cigarettes and e-cigarettes. If you need help quitting, ask your health care provider.  Keep all follow-up visits as told by your health care provider. This is important, especially if you had a biopsy taken. Get help right away if:  You have shortness of breath that gets worse.  You become light-headed or feel like you might faint.  You have  chest pain.  You cough up more than a small amount of blood.  The amount of blood you cough up increases. Summary  Common symptoms in the 24-48 hours following a flexible bronchoscopy include cough, low-grade fever, sore throat or hoarse voice, and blood-streaked mucus from the lungs (if you had a biopsy).  Do not eat or drink anything (including water) for 2 hours after your procedure, or until your local anesthetic has worn off. You can return to your normal diet the day after the procedure.  Get help right away if you develop worsening shortness of breath, have chest pain, become light-headed, or cough up more than a small amount of blood. This information is not intended to replace advice given to you by your health care provider. Make sure you discuss any questions you have with your health care provider. Document Revised: 06/20/2017 Document Reviewed: 07/26/2016 Elsevier Patient Education  2020 [employer].

## 2020-07-04 ENCOUNTER — Telehealth: Payer: Self-pay | Admitting: Radiation Oncology

## 2020-07-04 ENCOUNTER — Encounter (HOSPITAL_COMMUNITY): Payer: Self-pay | Admitting: Cardiothoracic Surgery

## 2020-07-04 LAB — SURGICAL PATHOLOGY

## 2020-07-04 NOTE — Telephone Encounter (Signed)
I called the patient and LM asking him to call us back. On the message I let him know that Dr. Servando Snare was recommending radiation rather than surgery and that we would get his biopsy results back soon and that I was penciling him in for an appt to plan the radiation on 07/10/20.
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
## 2020-07-05 ENCOUNTER — Telehealth: Payer: Self-pay | Admitting: Radiation Oncology

## 2020-07-05 LAB — CYTOLOGY - NON PAP

## 2020-07-05 NOTE — Telephone Encounter (Signed)
I called and spoke with the patient about the biopsy and results from his bronchoscopy with Dr. Servando Snare. He is in agreement to proceed with SBRT to the LUL Stage I cancer. He will come in next Tuesday for planning, and subsequent treatment is due to start 07/25/20.
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
## 2020-07-10 ENCOUNTER — Ambulatory Visit (INDEPENDENT_AMBULATORY_CARE_PROVIDER_SITE_OTHER): Payer: Medicare Other | Admitting: Ophthalmology

## 2020-07-10 ENCOUNTER — Other Ambulatory Visit: Payer: Self-pay

## 2020-07-10 ENCOUNTER — Encounter (INDEPENDENT_AMBULATORY_CARE_PROVIDER_SITE_OTHER): Payer: Self-pay | Admitting: Ophthalmology

## 2020-07-10 ENCOUNTER — Ambulatory Visit: Payer: Medicare Other | Admitting: Radiation Oncology

## 2020-07-10 DIAGNOSIS — H43813 Vitreous degeneration, bilateral: Secondary | ICD-10-CM

## 2020-07-10 DIAGNOSIS — H35352 Cystoid macular degeneration, left eye: Secondary | ICD-10-CM

## 2020-07-10 DIAGNOSIS — Z961 Presence of intraocular lens: Secondary | ICD-10-CM

## 2020-07-10 DIAGNOSIS — H15012 Anterior scleritis, left eye: Secondary | ICD-10-CM

## 2020-07-10 DIAGNOSIS — H40052 Ocular hypertension, left eye: Secondary | ICD-10-CM | POA: Diagnosis not present

## 2020-07-10 DIAGNOSIS — H3581 Retinal edema: Secondary | ICD-10-CM

## 2020-07-10 DIAGNOSIS — I10 Essential (primary) hypertension: Secondary | ICD-10-CM | POA: Diagnosis not present

## 2020-07-10 DIAGNOSIS — H35033 Hypertensive retinopathy, bilateral: Secondary | ICD-10-CM | POA: Diagnosis not present

## 2020-07-11 ENCOUNTER — Ambulatory Visit
Admission: RE | Admit: 2020-07-11 | Discharge: 2020-07-11 | Disposition: A | Discharge status: Home / Self Care | Payer: Medicare Other | Source: Ambulatory Visit | Attending: Radiation Oncology | Admitting: Radiation Oncology

## 2020-07-11 DIAGNOSIS — R918 Other nonspecific abnormal finding of lung field: Secondary | ICD-10-CM | POA: Insufficient documentation

## 2020-07-11 DIAGNOSIS — Z87891 Personal history of nicotine dependence: Secondary | ICD-10-CM | POA: Diagnosis not present

## 2020-07-11 DIAGNOSIS — C3412 Malignant neoplasm of upper lobe, left bronchus or lung: Secondary | ICD-10-CM | POA: Diagnosis not present

## 2020-07-16 DIAGNOSIS — Z87891 Personal history of nicotine dependence: Secondary | ICD-10-CM | POA: Diagnosis not present

## 2020-07-16 DIAGNOSIS — R918 Other nonspecific abnormal finding of lung field: Secondary | ICD-10-CM | POA: Diagnosis not present

## 2020-07-16 DIAGNOSIS — C3412 Malignant neoplasm of upper lobe, left bronchus or lung: Secondary | ICD-10-CM | POA: Diagnosis not present

## 2020-07-18 ENCOUNTER — Ambulatory Visit: Payer: Medicare Other | Admitting: Radiation Oncology

## 2020-07-19 ENCOUNTER — Ambulatory Visit: Payer: Medicare Other | Admitting: Radiation Oncology

## 2020-07-20 ENCOUNTER — Other Ambulatory Visit: Payer: Self-pay | Admitting: *Deleted

## 2020-07-20 NOTE — Progress Notes (Signed)
The proposed treatment discussed in cancer conference 12/30 is for discussion purpose only and is not a binding recommendation.  The patient was not physically examined nor present for their treatment options.  Therefore, final treatment plans cannot be decided.

## 2020-07-24 ENCOUNTER — Ambulatory Visit: Payer: Medicare Other | Admitting: Radiation Oncology

## 2020-07-25 ENCOUNTER — Ambulatory Visit
Admission: RE | Admit: 2020-07-25 | Discharge: 2020-07-25 | Disposition: A | Discharge status: Home / Self Care | Payer: Medicare Other | Source: Ambulatory Visit | Attending: Radiation Oncology | Admitting: Radiation Oncology

## 2020-07-25 DIAGNOSIS — C3412 Malignant neoplasm of upper lobe, left bronchus or lung: Secondary | ICD-10-CM | POA: Diagnosis not present

## 2020-07-25 DIAGNOSIS — R918 Other nonspecific abnormal finding of lung field: Secondary | ICD-10-CM | POA: Diagnosis not present

## 2020-07-25 DIAGNOSIS — Z87891 Personal history of nicotine dependence: Secondary | ICD-10-CM | POA: Diagnosis not present

## 2020-07-26 ENCOUNTER — Ambulatory Visit: Payer: Medicare Other | Admitting: Radiation Oncology

## 2020-07-27 ENCOUNTER — Ambulatory Visit
Admission: RE | Admit: 2020-07-27 | Discharge: 2020-07-27 | Disposition: A | Discharge status: Home / Self Care | Payer: Medicare Other | Source: Ambulatory Visit | Attending: Radiation Oncology | Admitting: Radiation Oncology

## 2020-07-27 ENCOUNTER — Other Ambulatory Visit: Payer: Self-pay

## 2020-07-27 DIAGNOSIS — R918 Other nonspecific abnormal finding of lung field: Secondary | ICD-10-CM | POA: Diagnosis not present

## 2020-07-28 ENCOUNTER — Ambulatory Visit: Payer: Medicare Other | Admitting: Radiation Oncology

## 2020-07-31 ENCOUNTER — Ambulatory Visit: Payer: Medicare Other | Admitting: Radiation Oncology

## 2020-08-01 ENCOUNTER — Ambulatory Visit
Admission: RE | Admit: 2020-08-01 | Discharge: 2020-08-01 | Disposition: A | Discharge status: Home / Self Care | Payer: Medicare Other | Source: Ambulatory Visit | Attending: Radiation Oncology | Admitting: Radiation Oncology

## 2020-08-01 ENCOUNTER — Ambulatory Visit: Payer: Medicare Other | Admitting: Radiation Oncology

## 2020-08-01 ENCOUNTER — Encounter: Payer: Self-pay | Admitting: Radiation Oncology

## 2020-08-01 DIAGNOSIS — R918 Other nonspecific abnormal finding of lung field: Secondary | ICD-10-CM | POA: Diagnosis not present

## 2020-08-02 ENCOUNTER — Ambulatory Visit: Payer: Medicare Other | Admitting: Radiation Oncology

## 2020-08-03 ENCOUNTER — Ambulatory Visit: Payer: Medicare Other | Admitting: Radiation Oncology

## 2020-08-04 ENCOUNTER — Ambulatory Visit: Payer: Medicare Other | Admitting: Radiation Oncology

## 2020-08-07 ENCOUNTER — Other Ambulatory Visit: Payer: Self-pay | Admitting: Radiation Oncology

## 2020-08-07 DIAGNOSIS — C3412 Malignant neoplasm of upper lobe, left bronchus or lung: Secondary | ICD-10-CM

## 2020-08-08 ENCOUNTER — Encounter (HOSPITAL_COMMUNITY): Payer: Self-pay

## 2020-08-08 ENCOUNTER — Ambulatory Visit (HOSPITAL_COMMUNITY)
Admission: EM | Admit: 2020-08-08 | Discharge: 2020-08-08 | Disposition: A | Discharge status: Home / Self Care | Payer: Medicare Other | Attending: Family Medicine | Admitting: Family Medicine

## 2020-08-08 ENCOUNTER — Ambulatory Visit (INDEPENDENT_AMBULATORY_CARE_PROVIDER_SITE_OTHER): Payer: Medicare Other

## 2020-08-08 DIAGNOSIS — M25531 Pain in right wrist: Secondary | ICD-10-CM | POA: Diagnosis not present

## 2020-08-08 DIAGNOSIS — M7989 Other specified soft tissue disorders: Secondary | ICD-10-CM | POA: Diagnosis not present

## 2020-08-08 HISTORY — DX: Malignant (primary) neoplasm, unspecified: C80.1

## 2020-08-08 MED ORDER — DICLOFENAC SODIUM 1 % EX GEL: 2.0000 g | Freq: Four times a day (QID) | CUTANEOUS | 1 refills | Status: DC

## 2020-08-08 NOTE — ED Triage Notes (Signed)
Pt worried about L hand middle finger swelling as well. Finger swelling and difficulty bending for two years.
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
## 2020-08-08 NOTE — ED Triage Notes (Signed)
Pt presents with R wrist pain, and swelling, for about 1 week. 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 He is also worried it could be gout. Swelling has gone down some but pain persist. Pt taken ibuprofen and arthritis medications. Pt also has tried cold and heat compresses.

## 2020-08-09 ENCOUNTER — Encounter: Payer: Self-pay | Admitting: Internal Medicine

## 2020-08-09 ENCOUNTER — Other Ambulatory Visit: Payer: Self-pay

## 2020-08-09 ENCOUNTER — Ambulatory Visit (INDEPENDENT_AMBULATORY_CARE_PROVIDER_SITE_OTHER): Payer: Medicare Other | Admitting: Internal Medicine

## 2020-08-09 VITALS — BP 142/73 | HR 99 | Temp 97.9°F | Ht 67.0 in | Wt 158.0 lb

## 2020-08-09 DIAGNOSIS — M25431 Effusion, right wrist: Secondary | ICD-10-CM

## 2020-08-09 DIAGNOSIS — M25531 Pain in right wrist: Secondary | ICD-10-CM

## 2020-08-09 NOTE — Patient Instructions (Signed)
Jason Carter, It was nice meeting you!  Today we discussed your wrist pain:  Continue taking Ibuprofen three times daily like we discussed. I would also use the brace you have when you need to do certain activities for more support.   I'm going to send a message to Dr. Lisbeth Renshaw to ask about further imaging being done on the wrist based on the x-ray finding.   Take care! Dr. Koleen Distance

## 2020-08-10 ENCOUNTER — Encounter: Payer: Self-pay | Admitting: Internal Medicine

## 2020-08-10 DIAGNOSIS — M25531 Pain in right wrist: Secondary | ICD-10-CM | POA: Insufficient documentation

## 2020-08-10 DIAGNOSIS — M1990 Unspecified osteoarthritis, unspecified site: Secondary | ICD-10-CM | POA: Insufficient documentation

## 2020-08-10 NOTE — Assessment & Plan Note (Signed)
Jason Carter presents with 2 week history of right wrist pain. 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  Since then, he has had some swelling and pain with certain movements such as turning the keys in his ignition or trying to open a packet or jar. He has been taking Ibuprofen as needed which helps.  He presented to urgent care the day before this visit. X-rays were done which were negative for fracture or soft tissue abnormality. There was translucency of distal ulna. Given his history of stage I non-small cell lung cancer, they mentioned further imaging might be warranted.  I have sent a message to his radiation oncologist to get input on what further imaging, if any, is recommended.   In the mean time, will continue to treat as tendon inflammation. Recommended scheduling Ibuprofen three times daily for the next 2 weeks and wearing a brace for support.

## 2020-08-10 NOTE — Progress Notes (Signed)
Internal Medicine Clinic Attending  Case discussed with Dr. Bloomfield  At the time of the visit.  We reviewed the resident's history and exam and pertinent patient test results.  I agree with the assessment, diagnosis, and plan of care documented in the resident's note.  
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
## 2020-08-16 NOTE — Progress Notes (Signed)
  Radiation Oncology         [phone] ________________________________  Name: Jason Carter MRN: [id number]  Date: 08/01/2020  DOB: 14-Jun-1946  End of Treatment Note  Diagnosis:   stage I non-small cell lung cancer     Indication for treatment::  curative       Radiation treatment dates:   07/25/20 - 08/01/20  Site/dose:   The patient was treated to the left lung with a course of stereotactic body radiation treatment.  The patient received 54 Gray in 3 fractions using a IMRT/SBRT technique, with 3 fields.  Narrative: The patient tolerated radiation treatment relatively well.   No unexpected difficulties.  The patient's breathing did not significantly change during the course of the treatment.  Plan: The patient has completed radiation treatment. The patient will return to radiation oncology clinic for routine followup in one month. I advised the patient to call or return sooner if they have any questions or concerns related to their recovery or treatment. ________________________________  Jodelle Gross, M.D., Ph.D.

## 2020-08-28 ENCOUNTER — Ambulatory Visit
Admission: RE | Admit: 2020-08-28 | Discharge: 2020-08-28 | Disposition: A | Discharge status: Home / Self Care | Payer: Medicare Other | Source: Ambulatory Visit | Attending: Radiation Oncology | Admitting: Radiation Oncology

## 2020-08-28 ENCOUNTER — Other Ambulatory Visit: Payer: Self-pay

## 2020-08-28 DIAGNOSIS — C3412 Malignant neoplasm of upper lobe, left bronchus or lung: Secondary | ICD-10-CM

## 2020-08-28 NOTE — Progress Notes (Signed)
  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  The patient received 54 Gray in 3 fractions using a IMRT/SBRT technique, with 3 fields.  Narrative:  The patient was contacted today for routine follow-up. During treatment he did very well with radiotherapy and did not have significant desquamation.   Impression/Plan: 1. 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 The patient was unavailable for a call. I left a voicemail and discussed that we would plan to follow up with his CT scan in the next few weeks that has been ordered. We will follow up with these results by phone as well and plan to see him at 6 month intervals thereafte. 2. Left middle finger fullness. The patient was encouraged to follow back up with PCP for consideration of further workup versus evaluation with orthopedics.      Carola Rhine, PAC

## 2020-09-11 ENCOUNTER — Other Ambulatory Visit: Payer: Self-pay

## 2020-09-11 ENCOUNTER — Ambulatory Visit (HOSPITAL_COMMUNITY)
Admission: RE | Admit: 2020-09-11 | Discharge: 2020-09-11 | Disposition: A | Discharge status: Home / Self Care | Payer: Medicare Other | Source: Ambulatory Visit | Attending: Radiation Oncology | Admitting: Radiation Oncology

## 2020-09-11 DIAGNOSIS — C3412 Malignant neoplasm of upper lobe, left bronchus or lung: Secondary | ICD-10-CM | POA: Diagnosis not present

## 2020-09-11 DIAGNOSIS — S2220XA Unspecified fracture of sternum, initial encounter for closed fracture: Secondary | ICD-10-CM | POA: Diagnosis not present

## 2020-09-11 DIAGNOSIS — J432 Centrilobular emphysema: Secondary | ICD-10-CM | POA: Diagnosis not present

## 2020-09-11 DIAGNOSIS — I2510 Atherosclerotic heart disease of native coronary artery without angina pectoris: Secondary | ICD-10-CM | POA: Diagnosis not present

## 2020-09-11 LAB — POCT I-STAT CREATININE: Creatinine, Ser: 0.6 mg/dL — ABNORMAL LOW (ref 0.61–1.24)

## 2020-09-11 MED ORDER — IOHEXOL 300 MG/ML  SOLN
75.0000 mL | Freq: Once | INTRAMUSCULAR | Status: AC | PRN
  Administered 2020-09-11: 75 mL via INTRAVENOUS

## 2020-09-12 ENCOUNTER — Telehealth: Payer: Self-pay | Admitting: Radiation Oncology

## 2020-09-12 DIAGNOSIS — C3412 Malignant neoplasm of upper lobe, left bronchus or lung: Secondary | ICD-10-CM

## 2020-09-12 NOTE — Telephone Encounter (Signed)
I tried to reach the patient to review his CT results. I had to leave a message asking him to call me at his convenience. I've placed orders for his next scan in 6 months as well.

## 2020-09-13 ENCOUNTER — Telehealth: Payer: Self-pay | Admitting: Radiation Oncology

## 2020-09-13 NOTE — Telephone Encounter (Signed)
The pt called back and we reviewed his CT scan results as well as his wrist which is getting slightly better.

## 2020-09-14 DIAGNOSIS — H40052 Ocular hypertension, left eye: Secondary | ICD-10-CM | POA: Diagnosis not present

## 2020-09-14 DIAGNOSIS — H40013 Open angle with borderline findings, low risk, bilateral: Secondary | ICD-10-CM | POA: Diagnosis not present

## 2020-10-20 DIAGNOSIS — Z9981 Dependence on supplemental oxygen: Secondary | ICD-10-CM

## 2020-10-20 HISTORY — DX: Dependence on supplemental oxygen: Z99.81

## 2020-11-06 ENCOUNTER — Ambulatory Visit (HOSPITAL_COMMUNITY)
Admission: RE | Admit: 2020-11-06 | Discharge: 2020-11-06 | Disposition: A | Discharge status: Home / Self Care | Payer: Medicare Other | Source: Ambulatory Visit | Attending: Internal Medicine | Admitting: Internal Medicine

## 2020-11-06 ENCOUNTER — Inpatient Hospital Stay (HOSPITAL_COMMUNITY)
Admission: EM | Admit: 2020-11-06 | Discharge: 2020-11-14 | DRG: 310 | Disposition: A | Discharge status: Home Health | Payer: Medicare Other | Attending: Internal Medicine | Admitting: Internal Medicine

## 2020-11-06 ENCOUNTER — Encounter (HOSPITAL_COMMUNITY): Payer: Self-pay | Admitting: *Deleted

## 2020-11-06 ENCOUNTER — Other Ambulatory Visit: Payer: Self-pay

## 2020-11-06 ENCOUNTER — Emergency Department (HOSPITAL_COMMUNITY): Payer: Medicare Other

## 2020-11-06 ENCOUNTER — Encounter: Payer: Self-pay | Admitting: Internal Medicine

## 2020-11-06 ENCOUNTER — Telehealth: Payer: Self-pay | Admitting: *Deleted

## 2020-11-06 ENCOUNTER — Ambulatory Visit (INDEPENDENT_AMBULATORY_CARE_PROVIDER_SITE_OTHER): Payer: Medicare Other | Admitting: Internal Medicine

## 2020-11-06 VITALS — BP 135/89 | HR 134 | Temp 97.8°F | Ht 67.0 in | Wt 163.4 lb

## 2020-11-06 DIAGNOSIS — R0602 Shortness of breath: Secondary | ICD-10-CM

## 2020-11-06 DIAGNOSIS — Z8679 Personal history of other diseases of the circulatory system: Secondary | ICD-10-CM | POA: Diagnosis present

## 2020-11-06 DIAGNOSIS — I4892 Unspecified atrial flutter: Secondary | ICD-10-CM

## 2020-11-06 DIAGNOSIS — R21 Rash and other nonspecific skin eruption: Secondary | ICD-10-CM | POA: Diagnosis not present

## 2020-11-06 DIAGNOSIS — R600 Localized edema: Secondary | ICD-10-CM | POA: Diagnosis not present

## 2020-11-06 DIAGNOSIS — I10 Essential (primary) hypertension: Secondary | ICD-10-CM | POA: Diagnosis present

## 2020-11-06 DIAGNOSIS — G4700 Insomnia, unspecified: Secondary | ICD-10-CM | POA: Diagnosis present

## 2020-11-06 DIAGNOSIS — I959 Hypotension, unspecified: Secondary | ICD-10-CM | POA: Diagnosis present

## 2020-11-06 DIAGNOSIS — I700 Atherosclerosis of aorta: Secondary | ICD-10-CM | POA: Diagnosis present

## 2020-11-06 DIAGNOSIS — R000 Tachycardia, unspecified: Secondary | ICD-10-CM

## 2020-11-06 DIAGNOSIS — R911 Solitary pulmonary nodule: Secondary | ICD-10-CM | POA: Diagnosis not present

## 2020-11-06 DIAGNOSIS — R7303 Prediabetes: Secondary | ICD-10-CM | POA: Diagnosis not present

## 2020-11-06 DIAGNOSIS — F1010 Alcohol abuse, uncomplicated: Secondary | ICD-10-CM | POA: Diagnosis not present

## 2020-11-06 DIAGNOSIS — I483 Typical atrial flutter: Principal | ICD-10-CM | POA: Diagnosis present

## 2020-11-06 DIAGNOSIS — F419 Anxiety disorder, unspecified: Secondary | ICD-10-CM | POA: Diagnosis present

## 2020-11-06 DIAGNOSIS — Z20822 Contact with and (suspected) exposure to covid-19: Secondary | ICD-10-CM | POA: Diagnosis not present

## 2020-11-06 DIAGNOSIS — Z87891 Personal history of nicotine dependence: Secondary | ICD-10-CM | POA: Diagnosis not present

## 2020-11-06 DIAGNOSIS — J439 Emphysema, unspecified: Secondary | ICD-10-CM | POA: Diagnosis not present

## 2020-11-06 DIAGNOSIS — M25531 Pain in right wrist: Secondary | ICD-10-CM

## 2020-11-06 DIAGNOSIS — I340 Nonrheumatic mitral (valve) insufficiency: Secondary | ICD-10-CM | POA: Diagnosis not present

## 2020-11-06 DIAGNOSIS — M109 Gout, unspecified: Secondary | ICD-10-CM | POA: Diagnosis not present

## 2020-11-06 DIAGNOSIS — D649 Anemia, unspecified: Secondary | ICD-10-CM | POA: Diagnosis present

## 2020-11-06 DIAGNOSIS — J9611 Chronic respiratory failure with hypoxia: Secondary | ICD-10-CM | POA: Diagnosis present

## 2020-11-06 DIAGNOSIS — I471 Supraventricular tachycardia: Secondary | ICD-10-CM | POA: Diagnosis present

## 2020-11-06 DIAGNOSIS — Z85118 Personal history of other malignant neoplasm of bronchus and lung: Secondary | ICD-10-CM

## 2020-11-06 DIAGNOSIS — Z8249 Family history of ischemic heart disease and other diseases of the circulatory system: Secondary | ICD-10-CM

## 2020-11-06 DIAGNOSIS — M7989 Other specified soft tissue disorders: Secondary | ICD-10-CM | POA: Diagnosis not present

## 2020-11-06 DIAGNOSIS — I361 Nonrheumatic tricuspid (valve) insufficiency: Secondary | ICD-10-CM | POA: Diagnosis not present

## 2020-11-06 DIAGNOSIS — Z923 Personal history of irradiation: Secondary | ICD-10-CM

## 2020-11-06 DIAGNOSIS — D509 Iron deficiency anemia, unspecified: Secondary | ICD-10-CM | POA: Diagnosis present

## 2020-11-06 HISTORY — DX: Unspecified atrial flutter: I48.92

## 2020-11-06 LAB — I-STAT CHEM 8, ED
BUN: 13 mg/dL (ref 8–23)
Calcium, Ion: 1.21 mmol/L (ref 1.15–1.40)
Chloride: 103 mmol/L (ref 98–111)
Creatinine, Ser: 0.7 mg/dL (ref 0.61–1.24)
Glucose, Bld: 180 mg/dL — ABNORMAL HIGH (ref 70–99)
HCT: 39 % (ref 39.0–52.0)
Hemoglobin: 13.3 g/dL (ref 13.0–17.0)
Potassium: 3.9 mmol/L (ref 3.5–5.1)
Sodium: 143 mmol/L (ref 135–145)
TCO2: 32 mmol/L (ref 22–32)

## 2020-11-06 LAB — D-DIMER, QUANTITATIVE: D-Dimer, Quant: 1.5 ug/mL-FEU — ABNORMAL HIGH (ref 0.00–0.50)

## 2020-11-06 LAB — BRAIN NATRIURETIC PEPTIDE: B Natriuretic Peptide: 248.6 pg/mL — ABNORMAL HIGH (ref 0.0–100.0)

## 2020-11-06 LAB — CBC WITH DIFFERENTIAL/PLATELET
Abs Immature Granulocytes: 0.01 10*3/uL (ref 0.00–0.07)
Basophils Absolute: 0 10*3/uL (ref 0.0–0.1)
Basophils Relative: 1 %
Eosinophils Absolute: 0.4 10*3/uL (ref 0.0–0.5)
Eosinophils Relative: 5 %
HCT: 39.8 % (ref 39.0–52.0)
Hemoglobin: 12.1 g/dL — ABNORMAL LOW (ref 13.0–17.0)
Immature Granulocytes: 0 %
Lymphocytes Relative: 18 %
Lymphs Abs: 1.4 10*3/uL (ref 0.7–4.0)
MCH: 28.2 pg (ref 26.0–34.0)
MCHC: 30.4 g/dL (ref 30.0–36.0)
MCV: 92.8 fL (ref 80.0–100.0)
Monocytes Absolute: 0.6 10*3/uL (ref 0.1–1.0)
Monocytes Relative: 7 %
Neutro Abs: 5.6 10*3/uL (ref 1.7–7.7)
Neutrophils Relative %: 69 %
Platelets: 198 10*3/uL (ref 150–400)
RBC: 4.29 MIL/uL (ref 4.22–5.81)
RDW: 14.2 % (ref 11.5–15.5)
WBC: 8.1 10*3/uL (ref 4.0–10.5)
nRBC: 0 % (ref 0.0–0.2)

## 2020-11-06 LAB — BASIC METABOLIC PANEL
Anion gap: 6 (ref 5–15)
BUN: 11 mg/dL (ref 8–23)
CO2: 28 mmol/L (ref 22–32)
Calcium: 8.6 mg/dL — ABNORMAL LOW (ref 8.9–10.3)
Chloride: 108 mmol/L (ref 98–111)
Creatinine, Ser: 0.63 mg/dL (ref 0.61–1.24)
GFR, Estimated: >60 mL/min (ref >60)
Glucose, Bld: 112 mg/dL — ABNORMAL HIGH (ref 70–99)
Potassium: 3.9 mmol/L (ref 3.5–5.1)
Sodium: 142 mmol/L (ref 135–145)

## 2020-11-06 LAB — TROPONIN I (HIGH SENSITIVITY)
Troponin I (High Sensitivity): 8 ng/L (ref <18)
Troponin I (High Sensitivity): 9 ng/L (ref <18)
Troponin I (High Sensitivity): 10 ng/L (ref <18)

## 2020-11-06 LAB — RESP PANEL BY RT-PCR (FLU A&B, COVID) ARPGX2
Influenza A by PCR: NEGATIVE
Influenza B by PCR: NEGATIVE
SARS Coronavirus 2 by RT PCR: NEGATIVE

## 2020-11-06 LAB — MAGNESIUM: Magnesium: 1.9 mg/dL (ref 1.7–2.4)

## 2020-11-06 LAB — TSH: TSH: 2.687 u[IU]/mL (ref 0.350–4.500)

## 2020-11-06 MED ORDER — ADULT MULTIVITAMIN W/MINERALS CH
1.0000 | ORAL_TABLET | Freq: Every day | ORAL | Status: DC
  Administered 2020-11-07 – 2020-11-14 (×8): 1 via ORAL
  Filled 2020-11-06 (×8): qty 1

## 2020-11-06 MED ORDER — FOLIC ACID 1 MG PO TABS: 1.0000 mg | ORAL_TABLET | Freq: Every day | ORAL | Status: DC

## 2020-11-06 MED ORDER — ONDANSETRON HCL 4 MG/2ML IJ SOLN: 4.0000 mg | Freq: Four times a day (QID) | INTRAMUSCULAR | Status: DC | PRN

## 2020-11-06 MED ORDER — SODIUM CHLORIDE 0.9 % IV BOLUS
1000.0000 mL | Freq: Once | INTRAVENOUS | Status: AC
  Administered 2020-11-06: 1000 mL via INTRAVENOUS

## 2020-11-06 MED ORDER — DILTIAZEM LOAD VIA INFUSION
10.0000 mg | Freq: Once | INTRAVENOUS | Status: AC
  Administered 2020-11-06: 10 mg via INTRAVENOUS
  Filled 2020-11-06: qty 10

## 2020-11-06 MED ORDER — IOHEXOL 350 MG/ML SOLN
50.0000 mL | Freq: Once | INTRAVENOUS | Status: AC | PRN
  Administered 2020-11-06: 50 mL via INTRAVENOUS

## 2020-11-06 MED ORDER — DILTIAZEM HCL-DEXTROSE 125-5 MG/125ML-% IV SOLN (PREMIX)
5.0000 mg/h | INTRAVENOUS | Status: DC
  Administered 2020-11-06: 5 mg/h via INTRAVENOUS
  Filled 2020-11-06: qty 125

## 2020-11-06 MED ORDER — METOPROLOL TARTRATE 5 MG/5ML IV SOLN
5.0000 mg | Freq: Once | INTRAVENOUS | Status: AC
  Administered 2020-11-07: 5 mg via INTRAVENOUS
  Filled 2020-11-06: qty 5

## 2020-11-06 MED ORDER — THIAMINE HCL 100 MG/ML IJ SOLN: 100.0000 mg | Freq: Every day | INTRAMUSCULAR | Status: DC

## 2020-11-06 MED ORDER — FUROSEMIDE 10 MG/ML IJ SOLN
20.0000 mg | Freq: Once | INTRAMUSCULAR | Status: AC
  Administered 2020-11-07: 20 mg via INTRAVENOUS
  Filled 2020-11-06: qty 2

## 2020-11-06 MED ORDER — HEPARIN (PORCINE) 25000 UT/250ML-% IV SOLN
1700.0000 [IU]/h | INTRAVENOUS | Status: DC
  Administered 2020-11-07: 1350 [IU]/h via INTRAVENOUS
  Administered 2020-11-07: 1100 [IU]/h via INTRAVENOUS
  Filled 2020-11-06 (×2): qty 250

## 2020-11-06 MED ORDER — ENOXAPARIN SODIUM 40 MG/0.4ML ~~LOC~~ SOLN: 40.0000 mg | SUBCUTANEOUS | Status: DC

## 2020-11-06 MED ORDER — THIAMINE HCL 100 MG PO TABS: 100.0000 mg | ORAL_TABLET | Freq: Every day | ORAL | Status: DC

## 2020-11-06 MED ORDER — HEPARIN BOLUS VIA INFUSION
4000.0000 [IU] | Freq: Once | INTRAVENOUS | Status: AC
  Administered 2020-11-07: 4000 [IU] via INTRAVENOUS
  Filled 2020-11-06: qty 4000

## 2020-11-06 MED ORDER — ACETAMINOPHEN 325 MG PO TABS
650.0000 mg | ORAL_TABLET | ORAL | Status: DC | PRN
  Administered 2020-11-10 – 2020-11-13 (×5): 650 mg via ORAL
  Filled 2020-11-06 (×6): qty 2

## 2020-11-06 MED ORDER — LORAZEPAM 1 MG PO TABS
1.0000 mg | ORAL_TABLET | ORAL | Status: DC | PRN
Start: 2020-11-06 — End: 2020-11-07

## 2020-11-06 MED ORDER — LORAZEPAM 2 MG/ML IJ SOLN
1.0000 mg | INTRAMUSCULAR | Status: DC | PRN
Start: 2020-11-06 — End: 2020-11-07

## 2020-11-06 NOTE — Assessment & Plan Note (Signed)
Patient continues to have right wrist swelling and pain.  He states it has improved since we last saw him and he is now able to use his right wrist more.  He denies having trouble opening things and is no longer dropping objects.  On exam there is still significant swelling and he does endorse intermittent pain.  We will repeat right wrist radiograph today.  Plan: Follow-up right wrist radiograph

## 2020-11-06 NOTE — Patient Instructions (Signed)
Thank you for allowing [place] to provide your care today. Today we discussed your elevated heart rate and shortness of breath.  I have ordered several labs for you. I will call you with the results.  I have also ordered a chest xray and echocardiogram. I want you to get the xray done today. We will call and schedule the ultrasound.    Today we made no changes to your medications.    Should you have any questions or concerns please call the internal medicine clinic at [phone].

## 2020-11-06 NOTE — Assessment & Plan Note (Addendum)
Vitals:   11/06/20 1100  BP: 135/89  Pulse: (!) 134  Temp: 97.8 F (36.6 C)  SpO2: 98%   Patient reports shortness of breath and a wet cough for the past few weeks. He initially attributed this to the increase in pollen which prompted him to check his pulse ox. He states his oxygenation was normal but he noticed his heart rate was in the 130s. He denies any chest pain, palpitations, fevers, chills, n/v, or recent sick contacts.  He denies feeling dizzy or dehydrated.  States his bowel movements and urination has been normal.  States his urine has been clear to dark yellow.  Denies any blood in his stool or urine.  He endorses bilateral lower extremity swelling that he noticed for the past few days with areas of redness with petechiae.  Denies any pain or tenderness of bilateral lower extremities.  States that he was recently diagnosed with lung cancer in September 2021 and was treated with radiation therapy up until January.  States his last CT scan showed improvement and plan to have a repeat CT scan in about 6 months.  He denies any cardiac history.  Never has had a echocardiogram.  Denies any history of blood clots.  EKG completed during visit showed tachycardia, heart rate 134 with sinus rhythm.  EKG did read atrial flutter with 2-1 AV conduction however each P wave was followed with a QRS.  Vagal maneuvers were attempted including carotid massage and bearing down with no improvement in patient's heart rate.  Lung exam demonstrated decreased breath sounds at bilateral bases with mild rales.  Cardiac exam demonstrated tachycardia without murmurs rubs or gallops.  There was bilateral lower extremity 2+ pitting edema up to mid shins with areas of erythema and petechiae.  No tenderness on palpation of bilateral lower extremities.  Unclear etiology of patient's presumed sinus tachycardia.  Ideally would like to slow down patient's heart rate and repeat EKG for better evaluation.  Differential diagnosis  is broad ranging from pulmonary emboli versus new onset heart failure versus paroxysmal A. fib versus thyroid disease versus anemia.  Given patient's recent diagnosis of lung cancer which is currently in remission, cannot rule out pulmonary emboli.  Wells score puts the patient at moderate risk group for PE.  Patient is currently hemodynamically stable with oxygen saturation at 98%.  We will obtain a stat CBC, troponin, D-dimer and TSH.  We will also obtain chest radiograph.  Plan: Follow-up CBC, TSH, troponin, D-dimer and chest radiograph   ADDENDUM: D-dimer elevated at 1.50, CBC showed hgb 12.1, decreased from 13.5 3 months ago. Will recommend patient go to the ED for further workup and CTA.
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
## 2020-11-06 NOTE — Progress Notes (Signed)
   11/06/20 2316  Assess: MEWS Score  Temp 98 F (36.7 C)  BP 136/90  Pulse Rate (!) 136  ECG Heart Rate (!) 136  Resp 18  Level of Consciousness Alert  SpO2 98 %  O2 Device Room Air  Assess: MEWS Score  MEWS Temp 0  MEWS Systolic 0  MEWS Pulse 3  MEWS RR 0  MEWS LOC 0  MEWS Score 3  MEWS Score Color Yellow  Assess: if the MEWS score is Yellow or Red  Were vital signs taken at a resting state? No  Focused Assessment No change from prior assessment  Early Detection of Sepsis Score *See Row Information* Low  MEWS guidelines implemented *See Row Information* Yes  Treat  Pain Scale 0-10  Pain Score 0  Patients Stated Pain Goal 0  Notify: Charge Nurse/RN  Name of Charge Nurse/RN Notified Eudell Julian, RN  Date Charge Nurse/RN Notified 11/06/20  Time Charge Nurse/RN Notified 2316
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
## 2020-11-06 NOTE — Telephone Encounter (Signed)
Call from pt stating his heart rate is elevated @ 130-136; pds of sob when he moves around but then back to normal. BP - 158/92. Denies cp, chills, fever.Hx lung cancer. No sob noticed when talking. Stated this has been going for about a week.No openings on red team. Appt scheduled with Dr Truman Hayward this am @ 1045 AM.
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
## 2020-11-06 NOTE — ED Triage Notes (Signed)
Pt sent here from cancer center. Reports recent sob, cough, swelling to legs. HR was elevated today and +D dimer.

## 2020-11-06 NOTE — ED Triage Notes (Signed)
Emergency Medicine Provider Triage Evaluation Note  Jason Carter , a 75 y.o. male  was evaluated in triage.  Pt complains of sob and cough. Was seen at oncology pta and was noted to be tachycardic. ddimer was ordered and positive so sent here for further eval  Review of Systems  Positive: Sob, cough, tachycardia Negative: Chest pain  Physical Exam  BP 127/73 (BP Location: Left Arm)   Pulse (!) 134   Temp 98.1 F (36.7 C) (Oral)   Resp 12   SpO2 97%  Gen:   Awake, no distress   HEENT:  Atraumatic  Resp:  Normal effort  Cardiac:  tachycardic Abd:   Nondistended MSK:   Moves extremities without difficulty  Neuro:  Speech clear   Medical Decision Making  Medically screening exam initiated at 3:40 PM.  Appropriate orders placed.  Kennth Vanbenschoten was informed that the remainder of the evaluation will be completed by another provider, this initial triage assessment does not replace that evaluation, and the importance of remaining in the ED until their evaluation is complete.  Clinical Impression   75 y/o M with hx of lung CA here for CTA chest after + ddimer at his oncologist PTA. CTA ordered. Advised nursing staff pt needs to be prioritized for a room   MSE was initiated and I personally evaluated the patient and placed orders (if any) at  3:41 PM on November 06, 2020.  The patient appears stable so that the remainder of the MSE may be completed by another provider.    Rodney Booze, PA-C 11/06/20 1544
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
## 2020-11-07 ENCOUNTER — Other Ambulatory Visit (HOSPITAL_COMMUNITY): Payer: Self-pay

## 2020-11-07 DIAGNOSIS — F1010 Alcohol abuse, uncomplicated: Secondary | ICD-10-CM

## 2020-11-07 DIAGNOSIS — M25531 Pain in right wrist: Secondary | ICD-10-CM

## 2020-11-07 DIAGNOSIS — Z85118 Personal history of other malignant neoplasm of bronchus and lung: Secondary | ICD-10-CM

## 2020-11-07 LAB — HEPARIN LEVEL (UNFRACTIONATED)
Heparin Unfractionated: <0.1 IU/mL — ABNORMAL LOW (ref 0.30–0.70)
Heparin Unfractionated: 0.14 IU/mL — ABNORMAL LOW (ref 0.30–0.70)

## 2020-11-07 LAB — CBC
HCT: 38 % — ABNORMAL LOW (ref 39.0–52.0)
Hemoglobin: 11.5 g/dL — ABNORMAL LOW (ref 13.0–17.0)
MCH: 28.1 pg (ref 26.0–34.0)
MCHC: 30.3 g/dL (ref 30.0–36.0)
MCV: 92.9 fL (ref 80.0–100.0)
Platelets: 188 10*3/uL (ref 150–400)
RBC: 4.09 MIL/uL — ABNORMAL LOW (ref 4.22–5.81)
RDW: 14.2 % (ref 11.5–15.5)
WBC: 7.9 10*3/uL (ref 4.0–10.5)
nRBC: 0 % (ref 0.0–0.2)

## 2020-11-07 LAB — COMPREHENSIVE METABOLIC PANEL
ALT: 20 U/L (ref 0–44)
AST: 18 U/L (ref 15–41)
Albumin: 3.4 g/dL — ABNORMAL LOW (ref 3.5–5.0)
Alkaline Phosphatase: 52 U/L (ref 38–126)
Anion gap: 7 (ref 5–15)
BUN: 8 mg/dL (ref 8–23)
CO2: 30 mmol/L (ref 22–32)
Calcium: 8.9 mg/dL (ref 8.9–10.3)
Chloride: 104 mmol/L (ref 98–111)
Creatinine, Ser: 0.68 mg/dL (ref 0.61–1.24)
GFR, Estimated: >60 mL/min (ref >60)
Glucose, Bld: 96 mg/dL (ref 70–99)
Potassium: 4.4 mmol/L (ref 3.5–5.1)
Sodium: 141 mmol/L (ref 135–145)
Total Bilirubin: 1 mg/dL (ref 0.3–1.2)
Total Protein: 6.5 g/dL (ref 6.5–8.1)

## 2020-11-07 LAB — MAGNESIUM: Magnesium: 2 mg/dL (ref 1.7–2.4)

## 2020-11-07 LAB — LACTIC ACID, PLASMA: Lactic Acid, Venous: 1.3 mmol/L (ref 0.5–1.9)

## 2020-11-07 LAB — PHOSPHORUS: Phosphorus: 3.2 mg/dL (ref 2.5–4.6)

## 2020-11-07 MED ORDER — METOPROLOL TARTRATE 25 MG PO TABS
25.0000 mg | ORAL_TABLET | Freq: Two times a day (BID) | ORAL | Status: DC
  Administered 2020-11-07: 25 mg via ORAL
  Filled 2020-11-07: qty 1

## 2020-11-07 MED ORDER — LACTATED RINGERS IV BOLUS
1000.0000 mL | Freq: Once | INTRAVENOUS | Status: AC
  Administered 2020-11-07: 1000 mL via INTRAVENOUS
  Filled 2020-11-07: qty 1000

## 2020-11-07 MED ORDER — DILTIAZEM HCL-DEXTROSE 125-5 MG/125ML-% IV SOLN (PREMIX)
5.0000 mg/h | INTRAVENOUS | Status: DC
  Administered 2020-11-07 – 2020-11-08 (×2): 5 mg/h via INTRAVENOUS
  Filled 2020-11-07 (×2): qty 125

## 2020-11-07 MED ORDER — METOPROLOL TARTRATE 5 MG/5ML IV SOLN: 5.0000 mg | INTRAVENOUS | Status: DC | PRN

## 2020-11-07 MED ORDER — METOPROLOL TARTRATE 25 MG PO TABS
25.0000 mg | ORAL_TABLET | Freq: Once | ORAL | Status: AC
  Administered 2020-11-07: 25 mg via ORAL
  Filled 2020-11-07: qty 1

## 2020-11-07 MED ORDER — METOPROLOL TARTRATE 25 MG PO TABS: 25.0000 mg | ORAL_TABLET | Freq: Four times a day (QID) | ORAL | Status: DC

## 2020-11-07 MED ORDER — METOPROLOL TARTRATE 5 MG/5ML IV SOLN
5.0000 mg | Freq: Once | INTRAVENOUS | Status: AC
  Administered 2020-11-07: 5 mg via INTRAVENOUS
  Filled 2020-11-07: qty 5

## 2020-11-07 MED ORDER — METOPROLOL TARTRATE 12.5 MG HALF TABLET: 12.5000 mg | ORAL_TABLET | Freq: Two times a day (BID) | ORAL | Status: DC

## 2020-11-07 MED ORDER — HEPARIN BOLUS VIA INFUSION
2000.0000 [IU] | Freq: Once | INTRAVENOUS | Status: AC
  Administered 2020-11-07: 2000 [IU] via INTRAVENOUS
  Filled 2020-11-07: qty 2000

## 2020-11-07 NOTE — Progress Notes (Signed)
   11/07/20 1200  Assess: MEWS Score  Temp (!) 97.5 F (36.4 C)  BP 97/74  Pulse Rate (!) 133  ECG Heart Rate (!) 134  Resp 16  Level of Consciousness Alert  SpO2 97 %  Assess: MEWS Score  MEWS Temp 0  MEWS Systolic 1  MEWS Pulse 3  MEWS RR 0  MEWS LOC 0  MEWS Score 4  MEWS Score Color Red  Assess: if the MEWS score is Yellow or Red  Were vital signs taken at a resting state? Yes  Focused Assessment No change from prior assessment  Early Detection of Sepsis Score *See Row Information* Low  MEWS guidelines implemented *See Row Information* Yes  Treat  Pain Scale 0-10  Pain Score 0  Take Vital Signs  Increase Vital Sign Frequency  Red: Q 1hr X 4 then Q 4hr X 4, if remains red, continue Q 4hrs  Escalate  MEWS: Escalate Red: discuss with charge nurse/RN and provider, consider discussing with RRT  Notify: Charge Nurse/RN  Name of Charge Nurse/RN Notified Kerrie Buffalo RN  Date Charge Nurse/RN Notified 11/07/20  Time Charge Nurse/RN Notified 1231  Notify: Provider  Provider Name/Title Alexandria Lodge MD  Date Provider Notified 11/07/20  Time Provider Notified 1228  Notification Type Call  Notification Reason Other (Comment) (Patient MEWS red B/P decreased. HR remains elevated)  Provider response Other (Comment) (Will call back with possible new orders)  Document  Patient Outcome Other (Comment) (No change. Awaiting new orders.)  Progress note created (see row info) Yes
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
## 2020-11-07 NOTE — Progress Notes (Signed)
ANTICOAGULATION CONSULT NOTE  Pharmacy Consult for Heparin Indication: atrial fibrillation  No Known Allergies  Patient Measurements: Height: 5\' 7"  (170.2 cm) Weight: 73.8 kg (162 lb 9.6 oz) IBW/kg (Calculated) : 66.1 Heparin Dosing Weight: 74 kg  Vital Signs: Temp: 97.7 F (36.5 C) (04/19 1930) Temp Source: Oral (04/19 1930) BP: 107/78 (04/19 2005) Pulse Rate: 128 (04/19 2005)  Labs: Recent Labs    11/06/20 1242 11/06/20 1544 11/06/20 1708 11/06/20 1712 11/07/20 0056 11/07/20 0818 11/07/20 1936  HGB 12.1*  --  13.3  --  11.5*  --   --   HCT 39.8  --  39.0  --  38.0*  --   --   PLT 198  --   --   --  188  --   --   HEPARINUNFRC  --   --   --   --   --  <0.10* 0.14*  CREATININE  --   --  0.70 0.63 0.68  --   --   TROPONINIHS 8 9  --  10  --   --   --     Estimated Creatinine Clearance: 74.6 mL/min (by C-G formula based on SCr of 0.68 mg/dL).    Assessment: 75 y.o. M presents with afib/flutter on heparin per pharmacy. No AC PTA.  -hg= 11.9 -heparin level now 0.14  Goal of Therapy:  Heparin level 0.3-0.7 units/ml Monitor platelets by anticoagulation protocol: Yes   Plan:  Repeat Heparin IV bolus 2000 units Increase Heparin gtt to 1650 units/hr Daily heparin level and CBC  Anette Guarneri, PharmD Clinical Pharmacist **Pharmacist phone directory can now be found on [email or web address] ([email or web address]).  Listed under [place].

## 2020-11-07 NOTE — Progress Notes (Signed)
HD#1 Subjective:   Admitted yesterday evening.  During evaluation this morning, patient is eating breakfast, states he is feeling well. He denies chest pain, palpitations, dizziness, lightheadedness. HR on the monitor in the 130s during our evaluation, and patient states he cannot tell that his pulse is elevated.   Objective:   Vital signs in last 24 hours: Vitals:   11/07/20 0100 11/07/20 0431 11/07/20 0445 11/07/20 0800  BP: 122/76 98/79 98/79  122/81  Pulse: 100 (!) 134 87 (!) 135  Resp: 18 19 20 16   Temp:  98.2 F (36.8 C) 98.2 F (36.8 C) 97.7 F (36.5 C)  TempSrc:  Oral Oral Oral  SpO2: 93% 98% 97% 95%  Weight:      Height:       Physical Exam Constitutional: thin, elderly man sitting up in bed, eating breakfast, in no acute distress HENT: normocephalic atraumatic, mucous membranes moist Cardiovascular: tachycardic, unable to discern regularity given rate, no appreciable m/r/g, pulses 2+ in radial arteries, 1+ in left DP and 2+ in right DP. Mild edema to the ankles Pulmonary/Chest: normal work of breathing on 2 L [place], clear but with decreased sounds Neurological: alert & oriented x 3 Skin: warm and dry, non-blanching petechial rash on right medial ankle  Filed Weights   11/06/20 2316  Weight: 73.8 kg    Intake/Output Summary (Last 24 hours) at 11/07/2020 1403 Last data filed at 11/07/2020 0900 Gross per 24 hour  Intake 358 ml  Output 800 ml  Net -442 ml   Net IO Since Admission: -800 mL [11/07/20 0824]  Pertinent Labs: CBC Latest Ref Rng & Units 11/07/2020 11/06/2020 11/06/2020  WBC 4.0 - 10.5 K/uL 7.9 - 8.1  Hemoglobin 13.0 - 17.0 g/dL 11.5(L) 13.3 12.1(L)  Hematocrit 39.0 - 52.0 % 38.0(L) 39.0 39.8  Platelets 150 - 400 K/uL 188 - 198    CMP Latest Ref Rng & Units 11/07/2020 11/06/2020 11/06/2020  Glucose 70 - 99 mg/dL 96 112(H) 180(H)  BUN 8 - 23 mg/dL 8 11 13   Creatinine 0.61 - 1.24 mg/dL 0.68 0.63 0.70  Sodium 135 - 145 mmol/L 141 142 143  Potassium  3.5 - 5.1 mmol/L 4.4 3.9 3.9  Chloride 98 - 111 mmol/L 104 108 103  CO2 22 - 32 mmol/L 30 28 -  Calcium 8.9 - 10.3 mg/dL 8.9 8.6(L) -  Total Protein 6.5 - 8.1 g/dL 6.5 - -  Total Bilirubin 0.3 - 1.2 mg/dL 1.0 - -  Alkaline Phos 38 - 126 U/L 52 - -  AST 15 - 41 U/L 18 - -  ALT 0 - 44 U/L 20 - -    Imaging: DG Chest 2 View  Result Date: 11/06/2020 CLINICAL DATA:  Shortness of breath, tachycardia. EXAM: CHEST - 2 VIEW COMPARISON:  July 03, 2020. FINDINGS: The heart size and mediastinal contours are within normal limits. No pneumothorax or pleural effusion is noted. Stable left upper lobe scarring is noted. No acute abnormality is noted. Probable old sternal fracture is noted. No acute osseous abnormality is noted. IMPRESSION: No active cardiopulmonary disease. Aortic Atherosclerosis (ICD10-I70.0). Electronically Signed   By: Marijo Conception M.D.   On: 11/06/2020 16:13   DG Wrist Complete Right  Result Date: 11/06/2020 CLINICAL DATA:  Right wrist swelling and sprain. EXAM: RIGHT WRIST - COMPLETE 3+ VIEW COMPARISON:  August 08, 2020. FINDINGS: There is no evidence of fracture or dislocation. Lucency is seen in the distal ulna which is stable compared to prior exam. Joint  spaces are unremarkable. Soft tissues are unremarkable. IMPRESSION: Stable lucency seen in distal ulna compared to prior exam. Metastatic focus cannot be excluded. No other significant abnormality is seen. Electronically Signed   By: Marijo Conception M.D.   On: 11/06/2020 16:16   CT Angio Chest PE W and/or Wo Contrast  Result Date: 11/06/2020 CLINICAL DATA:  Shortness of breath, evaluate for PE. History of non-small cell lung cancer, status post radiation. EXAM: CT ANGIOGRAPHY CHEST WITH CONTRAST TECHNIQUE: Multidetector CT imaging of the chest was performed using the standard protocol during bolus administration of intravenous contrast. Multiplanar CT image reconstructions and MIPs were obtained to evaluate the vascular  anatomy. CONTRAST:  57mL OMNIPAQUE IOHEXOL 350 MG/ML SOLN COMPARISON:  CT chest dated 09/11/2020 FINDINGS: Cardiovascular: Satisfactory opacification of the bilateral pulmonary arteries to the segmental level. No evidence of pulmonary embolism. No evidence of thoracic aortic aneurysm. Atherosclerotic calcifications of the aortic arch. The heart is normal in size.  No pericardial effusion. Mediastinum/Nodes: No suspicious mediastinal lymphadenopathy. Visualized thyroid is unremarkable. Lungs/Pleura: 9 x 11 mm irregular nodule in the left lung apex (series 6/image 28), unchanged. 8 mm right middle lobe nodule (series 6/image 93), unchanged. Additional scattered calcified and noncalcified pulmonary nodules, including clustered calcified granulomata in the superior segment left lower lobe (series 6/image 67), benign. Moderate centrilobular and paraseptal emphysematous changes, upper lung predominant. No focal consolidation. No pleural effusion or pneumothorax. Upper Abdomen: Visualized upper abdomen is notable for calcified splenic granulomata and vascular calcifications. Musculoskeletal: Degenerative changes of the visualized thoracolumbar spine. Review of the MIP images confirms the above findings. IMPRESSION: No evidence of pulmonary embolism. Stable 9 x 11 mm irregular nodule in the left lung apex, likely corresponding to the patient's treated primary bronchogenic neoplasm. No findings suspicious for metastatic disease. Aortic Atherosclerosis (ICD10-I70.0) and Emphysema (ICD10-J43.9). Electronically Signed   By: Julian Hy M.D.   On: 11/06/2020 17:48    Assessment/Plan:   Active Problems:   Atrial flutter with rapid ventricular response ([place])   Patient Summary:  Jason Carter is a 75 y.o. 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  Atrial flutter with RVR Patient continues to feel well. Placed on 2 L [place] early this morning for comfort. Initially treated with cardizem drip in the ED and trialed on IV metoprolol on admission with good effect. However, upon reevaluation this morning shortly after receiving PO metoprolol 25 mg, rate stable in the 130s. Euvolemic on exam. Placed patient back on cardizem drip, and his HR has improved to the 90s-110s, though BP dropped to the 08Q systolic. Bolused patient 1 L LR and continued drip. Patient has only been able to tolerate lowest rate at 5 mg/hr. Repeat EKG demonstrates atrial flutter with variable conduction. Will redose PO metoprolol 25 mg this evening and monitor for response. This will be a delicate balance with the patient's blood pressure. As for etiology, TTE will be telling. Patient's history of lung cancer and previous daily alcohol use may be contributing. - TTE  - IV heparin - s/p 20 mg IV Lasix on admission - daily weights, strict I/O - Daily BMP, Mg  History of stage Ia NSCLC of the left upper lobe lung  s/p radiation treatment Follow-up with oncology outpatient  Normocytic anemia On admission, hemoglobin of 12.1, MCV of 92.8. No sign/symptoms of blood loss at this time.  Patient denies blood in urine or dark tarry/bright red stools. Suspect anemia of chronic disease contributing. Do not suspect acute bleed at this time. Will further characterize with iron studies. 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 Notes history of DTs when he was in [place] many years ago when he drank much more.  States he is able to stop drinking denies having episodes of tremors, seizures, or any other symptoms of withdrawals.  - CIWA  without Ativan  Chronic right wrist pain Stable lucency. Follow-up with oncology outpatient.    Diet: Heart Healthy IVF: s/p 1 L LR VTE: Heparin Code: Full   Please contact the on call pager after 5 pm and on weekends at [phone].  Alexandria Lodge, MD PGY-1 Internal Medicine Teaching Service Pager: [phone] 11/07/2020

## 2020-11-07 NOTE — Social Work (Signed)
CSW received consult for substance use resources. CSW spoke with patient at bedside. CSW provided patient with outpatient substance use treatment services resources. Patient accepted. CSW will continue to follow.

## 2020-11-07 NOTE — TOC Benefit Eligibility Note (Signed)
Patient Teacher, English as a foreign language completed.    The patient is currently admitted and upon discharge could be taking Eliquis 5 mg.  The current 30 day co-pay is, $450.94 due to a $403.94 deductible, once deductible is met the co-pay will be $47.00.   The patient is currently admitted and upon discharge could be taking Xarelto 10 mg.  The current 30 day co-pay is, $450.94 due to a $403.94 deductible, once deductible is met the co-pay will be $47.00.   The patient is insured through [place], [place] Patient Advocate Specialist [place] Team Direct Number: [phone]  Fax: [phone]

## 2020-11-08 ENCOUNTER — Inpatient Hospital Stay (HOSPITAL_COMMUNITY): Payer: Medicare Other

## 2020-11-08 DIAGNOSIS — G4700 Insomnia, unspecified: Secondary | ICD-10-CM

## 2020-11-08 DIAGNOSIS — I4892 Unspecified atrial flutter: Secondary | ICD-10-CM

## 2020-11-08 DIAGNOSIS — F419 Anxiety disorder, unspecified: Secondary | ICD-10-CM

## 2020-11-08 LAB — ECHOCARDIOGRAM COMPLETE
Area-P 1/2: 4.49 cm2
Height: 67 in
S' Lateral: 3.5 cm
Weight: 2529.6 oz

## 2020-11-08 LAB — IRON AND TIBC
Iron: 57 ug/dL (ref 45–182)
Saturation Ratios: 18 % (ref 17.9–39.5)
TIBC: 309 ug/dL (ref 250–450)
UIBC: 252 ug/dL

## 2020-11-08 LAB — HEPARIN LEVEL (UNFRACTIONATED): Heparin Unfractionated: 0.3 IU/mL (ref 0.30–0.70)

## 2020-11-08 LAB — CBC
HCT: 36.8 % — ABNORMAL LOW (ref 39.0–52.0)
Hemoglobin: 11.1 g/dL — ABNORMAL LOW (ref 13.0–17.0)
MCH: 28 pg (ref 26.0–34.0)
MCHC: 30.2 g/dL (ref 30.0–36.0)
MCV: 92.9 fL (ref 80.0–100.0)
Platelets: 180 10*3/uL (ref 150–400)
RBC: 3.96 MIL/uL — ABNORMAL LOW (ref 4.22–5.81)
RDW: 14.1 % (ref 11.5–15.5)
WBC: 9.2 10*3/uL (ref 4.0–10.5)
nRBC: 0 % (ref 0.0–0.2)

## 2020-11-08 LAB — BASIC METABOLIC PANEL
Anion gap: 6 (ref 5–15)
BUN: 14 mg/dL (ref 8–23)
CO2: 30 mmol/L (ref 22–32)
Calcium: 8.8 mg/dL — ABNORMAL LOW (ref 8.9–10.3)
Chloride: 105 mmol/L (ref 98–111)
Creatinine, Ser: 0.8 mg/dL (ref 0.61–1.24)
GFR, Estimated: >60 mL/min (ref >60)
Glucose, Bld: 113 mg/dL — ABNORMAL HIGH (ref 70–99)
Potassium: 4.2 mmol/L (ref 3.5–5.1)
Sodium: 141 mmol/L (ref 135–145)

## 2020-11-08 LAB — RETICULOCYTES
Immature Retic Fract: 11.2 % (ref 2.3–15.9)
RBC.: 3.92 MIL/uL — ABNORMAL LOW (ref 4.22–5.81)
Retic Count, Absolute: 52.9 10*3/uL (ref 19.0–186.0)
Retic Ct Pct: 1.4 % (ref 0.4–3.1)

## 2020-11-08 LAB — FERRITIN: Ferritin: 161 ng/mL (ref 24–336)

## 2020-11-08 MED ORDER — RAMELTEON 8 MG PO TABS
8.0000 mg | ORAL_TABLET | Freq: Every evening | ORAL | Status: DC | PRN
  Filled 2020-11-08: qty 1

## 2020-11-08 MED ORDER — METOPROLOL TARTRATE 25 MG PO TABS
25.0000 mg | ORAL_TABLET | Freq: Two times a day (BID) | ORAL | Status: DC
  Administered 2020-11-08: 25 mg via ORAL
  Filled 2020-11-08: qty 1

## 2020-11-08 MED ORDER — HEPARIN (PORCINE) 25000 UT/250ML-% IV SOLN: 1650.0000 [IU]/h | INTRAVENOUS | Status: AC

## 2020-11-08 MED ORDER — RAMELTEON 8 MG PO TABS: 8.0000 mg | ORAL_TABLET | Freq: Every day | ORAL | Status: DC

## 2020-11-08 MED ORDER — RIVAROXABAN 20 MG PO TABS: 20.0000 mg | ORAL_TABLET | Freq: Every day | ORAL | Status: DC

## 2020-11-08 MED ORDER — METOPROLOL TARTRATE 50 MG PO TABS
50.0000 mg | ORAL_TABLET | Freq: Two times a day (BID) | ORAL | Status: DC
  Administered 2020-11-08: 50 mg via ORAL
  Filled 2020-11-08: qty 1

## 2020-11-08 MED ORDER — RIVAROXABAN 20 MG PO TABS
20.0000 mg | ORAL_TABLET | Freq: Every day | ORAL | Status: DC
  Administered 2020-11-08 – 2020-11-13 (×6): 20 mg via ORAL
  Filled 2020-11-08 (×6): qty 1

## 2020-11-08 NOTE — Progress Notes (Signed)
  Echocardiogram 2D Echocardiogram has been performed.  Randa Lynn Drina Jobst 11/08/2020, 5:34 PM

## 2020-11-08 NOTE — Progress Notes (Signed)
Internal Medicine Clinic Attending  I saw and evaluated the patient.  I personally confirmed the key portions of the history and exam documented by Dr. Laural Golden and I reviewed pertinent patient test results and EKG.  The assessment, diagnosis, and plan were formulated together and I agree with the documentation in the resident's note. At the time of this assessment, plan was for stat labs to r/o anemia, hyperthyroidism, and elevated DD (possible PE?) prior to determining whether CT of chest would be pursued urgently vs emergently.  He is clinically stable - no CP, no dyspnea at rest, though does have DOE and new LE edema which suggests possible heart failure due to tachycardia.  The EKG was initially read by me as ST at rate of 130s though overread indicated the correct AFlutter with 2:1 conduction.  Cause unclear.  Disposition pending.

## 2020-11-08 NOTE — Progress Notes (Signed)
Chaplain responded to consult for patient requesting prayer. Chaplain visited with patient as he talked about his illness. He informed Chaplain that he is [place]. Chaplain asked if he would like a visit from a [place] and he said he would but not now and he will let Chaplain know when he would like to speak to one. Chaplain asked him to let his nurse know and Chaplain will contact Father Barnabas Lister to come and visit with him.    11/08/20 1000  Clinical Encounter Type  Visited With Patient  Visit Type Spiritual support  Referral From Nurse  Consult/Referral To Chaplain

## 2020-11-08 NOTE — Discharge Instructions (Addendum)
Jason Carter,  It was a pleasure taking care of you in the hospital. You were admitted with shortness of breath and found to have an abnormal heart rhythm and elevated heart rate. The heart rhythm is called atrial flutter. We took a picture of you heart which showed enlargement of your right atrium, which is likely from underlying lung disease (history of smoking and history of lung cancer). You were first treated with medications to control your heart rate, however ultimately you required a procedure to reset your heart rhythm, called a cardioversion. Your heart is now in regular rhythm. It is very important that you follow-up with the heart doctors. You were also started on a blood thinner, Xarelto, which you need to continue taking daily.  In addition to following up with cardiology, please follow-up in the internal medicine clinic. Someone will call you to schedule an appointment for hospital follow-up.  Take care!   Information on my medicine - XARELTO (Rivaroxaban)  Why was Xarelto prescribed for you? Xarelto was prescribed for you to reduce the risk of a blood clot forming that can cause a stroke if you have a medical condition called atrial fibrillation (a type of irregular heartbeat).  What do you need to know about xarelto ? Take your Xarelto ONCE DAILY at the same time every day with your evening meal. If you have difficulty swallowing the tablet whole, you may crush it and mix in applesauce just prior to taking your dose.  Take Xarelto exactly as prescribed by your doctor and DO NOT stop taking Xarelto without talking to the doctor who prescribed the medication.  Stopping without other stroke prevention medication to take the place of Xarelto may increase your risk of developing a clot that causes a stroke.  Refill your prescription before you run out.  After discharge, you should have regular check-up appointments with your healthcare provider that is prescribing your Xarelto.   In the future your dose may need to be changed if your kidney function or weight changes by a significant amount.  What do you do if you miss a dose? If you are taking Xarelto ONCE DAILY and you miss a dose, take it as soon as you remember on the same day then continue your regularly scheduled once daily regimen the next day. Do not take two doses of Xarelto at the same time or on the same day.   Important Safety Information A possible side effect of Xarelto is bleeding. You should call your healthcare provider right away if you experience any of the following: ? Bleeding from an injury or your nose that does not stop. ? Unusual colored urine (red or dark brown) or unusual colored stools (red or black). ? Unusual bruising for unknown reasons. ? A serious fall or if you hit your head (even if there is no bleeding).  Some medicines may interact with Xarelto and might increase your risk of bleeding while on Xarelto. To help avoid this, consult your healthcare provider or pharmacist prior to using any new prescription or non-prescription medications, including herbals, vitamins, non-steroidal anti-inflammatory drugs (NSAIDs) and supplements.  This website has more information on Xarelto: [email or web address]    Atrial Flutter  Atrial flutter is a type of abnormal heart rhythm (arrhythmia). The heart has an electrical system that tells it how to beat. In atrial flutter, the signals move rapidly in the top chambers of the heart (the atria). This makes your heart beat very fast. Atrial flutter can come and go, or  it can be permanent. The goal of treatment is to prevent blood clots from forming, control your heart rate, or restore your heartbeat to a normal rhythm. If this condition is not treated, it can cause serious problems, such as a weakened heart muscle (cardiomyopathy) or a stroke. What are the causes? This condition is often caused by conditions that damage the heart's electrical system.  These include:  Heart conditions and heart surgery. These include heart attacks and open-heart surgery.  Lung problems, such as COPD or a blood clot in the lung (pulmonary embolism, or PE).  Poorly controlled high blood pressure (hypertension).  Overactive thyroid (hyperthyroidism).  Diabetes. In some cases, the cause of this condition is not known. What increases the risk? You are more likely to develop this condition if:  You are an elderly adult.  You are a man.  You are overweight (obese).  You have obstructive sleep apnea.  You have a family history of atrial flutter.  You have diabetes.  You drink a lot of alcohol, especially binge drinking.  You use drugs, including cannabis.  You smoke. What are the signs or symptoms? Symptoms of this condition include:  A feeling that your heart is pounding or racing (palpitations).  Shortness of breath.  Chest pain.  Feeling dizzy or light-headed.  Fainting.  Low blood pressure (hypotension).  Fatigue.  Tiring easily during exercise or activity. In some cases, there are no symptoms. How is this diagnosed? This condition may be diagnosed with:  An electrocardiogram (ECG) to check electrical signals of the heart.  An ambulatory cardiac monitor to record your heart's activity for a few days.  An echocardiogram to create pictures of your heart.  A transesophageal echocardiogram (TEE) to create even better pictures of your heart.  A stress test to check your blood supply while you exercise.  Imaging tests, such as a CT scan or chest X-ray.  Blood tests. How is this treated? Treatment depends on underlying conditions and how you feel when you experience atrial flutter. This condition may be treated with:  Medicines to prevent blood clots or to treat heart rate or heart rhythm problems.  Electrical cardioversion to reset the heart's rhythm.  Ablation to remove the heart tissue that sends abnormal  signals.  Left atrial appendage closure to seal the area where blood clots can form. In some cases, underlying conditions will be treated. Follow these instructions at home: Medicines  Take over-the-counter and prescription medicines only as told by your health care provider.  Do not take any new medicines without talking to your health care provider.  If you are taking blood thinners: ? Talk with your health care provider before you take any medicines that contain aspirin or NSAIDs, such as ibuprofen. These medicines increase your risk for dangerous bleeding. ? Take your medicine exactly as told, at the same time every day. ? Avoid activities that could cause injury or bruising, and follow instructions about how to prevent falls. ? Wear a medical alert bracelet or carry a card that lists what medicines you take. Lifestyle  Eat heart-healthy foods. Talk with a dietitian to make an eating plan that is right for you.  Do not use any products that contain nicotine or tobacco, such as cigarettes, e-cigarettes, and chewing tobacco. If you need help quitting, ask your health care provider.  Do not drink alcohol.  Do not use drugs, including cannabis.  Lose weight if you are overweight or obese.  Exercise regularly as instructed by your  health care provider. General instructions  Do not use diet pills unless your health care provider approves. Diet pills may make heart problems worse.  If you have obstructive sleep apnea, manage your condition as told by your health care provider.  Keep all follow-up visits as told by your health care provider. This is important. Contact a health care provider if you:  Notice a change in the rate, rhythm, or strength of your heartbeat.  Are taking a blood thinner and you notice more bruising.  Have a sudden change in weight.  Tire more easily when you exercise or do heavy work. Get help right away if you have:  Pain or pressure in your  chest.  Shortness of breath.  Fainting.  Increasing sweating with no known cause.  Side effects of blood thinners, such as blood in your vomit, stool, or urine, or bleeding that cannot stop.  Any symptoms of a stroke. "BE FAST" is an easy way to remember the main warning signs of a stroke: ? B - Balance. Signs are dizziness, sudden trouble walking, or loss of balance. ? E - Eyes. Signs are trouble seeing or a sudden change in vision. ? F - Face. Signs are sudden weakness or numbness of the face, or the face or eyelid drooping on one side. ? A - Arms. Signs are weakness or numbness in an arm. This happens suddenly and usually on one side of the body. ? S - Speech. Signs are sudden trouble speaking, slurred speech, or trouble understanding what people say. ? T - Time. Time to call emergency services. Write down what time symptoms started.  Other signs of a stroke, such as: ? A sudden, severe headache with no known cause. ? Nausea or vomiting. ? Seizure.  These symptoms may represent a serious problem that is an emergency. Do not wait to see if the symptoms will go away. Get medical help right away. Call your local emergency services (911 in the [place] Do not drive yourself to the hospital. Summary  Atrial flutter is an abnormal heart rhythm that can give you symptoms of palpitations, shortness of breath, or fatigue.  Atrial flutter is often treated with medicines to keep your heart in a normal rhythm and to prevent a stroke.  Get help right away if you cannot catch your breath, or have chest pain or pressure.  Get help right away if you have signs or symptoms of a stroke. This information is not intended to replace advice given to you by your health care provider. Make sure you discuss any questions you have with your health care provider. Document Revised: 12/30/2018 Document Reviewed: 12/30/2018 Elsevier Patient Education  [place].

## 2020-11-08 NOTE — Progress Notes (Addendum)
ANTICOAGULATION CONSULT NOTE  Pharmacy Consult for Heparin Indication: atrial fibrillation  No Known Allergies  Patient Measurements: Height: 5\' 7"  (170.2 cm) Weight: 71.7 kg (158 lb 1.6 oz) IBW/kg (Calculated) : 66.1 Heparin Dosing Weight: 74 kg  Vital Signs: Temp: 97.8 F (36.6 C) (04/20 0746) Temp Source: Oral (04/20 0746) BP: 127/95 (04/20 0853) Pulse Rate: 99 (04/20 0853)  Labs: Recent Labs    11/06/20 1242 11/06/20 1242 11/06/20 1544 11/06/20 1708 11/06/20 1712 11/07/20 0056 11/07/20 0818 11/07/20 1936 11/08/20 0417  HGB 12.1*  --   --  13.3  --  11.5*  --   --  11.1*  HCT 39.8  --   --  39.0  --  38.0*  --   --  36.8*  PLT 198  --   --   --   --  188  --   --  180  HEPARINUNFRC  --   --   --   --   --   --  <0.10* 0.14* 0.30  CREATININE  --    < >  --  0.70 0.63 0.68  --   --  0.80  TROPONINIHS 8  --  9  --  10  --   --   --   --    < > = values in this interval not displayed.    Estimated Creatinine Clearance: 74.6 mL/min (by C-G formula based on SCr of 0.8 mg/dL).    Assessment: 75 y.o. M presents with afib/flutter on heparin per pharmacy. No AC PTA.  -hg= 11.1 -heparin level = 0.3  Goal of Therapy:  Heparin level 0.3-0.7 units/ml Monitor platelets by anticoagulation protocol: Yes   Plan:  Increase Heparin gtt to 1700 units/hr to keep in range Recheck heparin level later today  Hildred Laser, PharmD Clinical Pharmacist **Pharmacist phone directory can now be found on [email or web address] ([email or web address]).  Listed under [place].  Addendum -Changing to [place] 20mg  po daily (start at lunch) and discontinue heparin -Will provide patient education  Hildred Laser, PharmD Clinical Pharmacist **Pharmacist phone directory can now be found on [email or web address] ([email or web address]).  Listed under [place].

## 2020-11-08 NOTE — Progress Notes (Signed)
HD#2 Subjective:   Admitted yesterday evening.  During evaluation this morning, patient is lying in bed, in no acute distress. He denies chest pain and shortness of breath. He reports increased anxiety lately. States he thinks anxiety is exacerbating his heart problems. Discussed that anxiety certainly does not help his rate but does not entirely explain his heart rhythm. He declines initiation of medication for anxiety at this time, stating he would like to think about it. Requests we make a sleep aid available tonight.   Objective:   Vital signs in last 24 hours: Vitals:   11/08/20 0522 11/08/20 0552 11/08/20 0622 11/08/20 0746  BP: 103/61 (!) 119/59 116/86 124/72  Pulse: 65 63 64 82  Resp:    16  Temp:    97.8 F (36.6 C)  TempSrc:    Oral  SpO2: 97% 97% 97% 90%  Weight:      Height:       Physical Exam Constitutional: thin, elderly man lying in bed, in no acute distress HENT: normocephalic atraumatic, mucous membranes moist Cardiovascular: regular rate and rhythm, no m/r/g, mild edema to the ankles Pulmonary/Chest: normal work of breathing on 1 L [place], clear but with decreased sounds Neurological: alert & oriented x 3  Filed Weights   11/06/20 2316 11/08/20 0456  Weight: 73.8 kg 71.7 kg    Intake/Output Summary (Last 24 hours) at 11/08/2020 0802 Last data filed at 11/07/2020 2131 Gross per 24 hour  Intake 1770.09 ml  Output 728 ml  Net 1042.09 ml   Net IO Since Admission: 242.09 mL [11/08/20 0802]  Pertinent Labs: CBC Latest Ref Rng & Units 11/08/2020 11/07/2020 11/06/2020  WBC 4.0 - 10.5 K/uL 9.2 7.9 -  Hemoglobin 13.0 - 17.0 g/dL 11.1(L) 11.5(L) 13.3  Hematocrit 39.0 - 52.0 % 36.8(L) 38.0(L) 39.0  Platelets 150 - 400 K/uL 180 188 -    CMP Latest Ref Rng & Units 11/08/2020 11/07/2020 11/06/2020  Glucose 70 - 99 mg/dL 113(H) 96 112(H)  BUN 8 - 23 mg/dL 14 8 11   Creatinine 0.61 - 1.24 mg/dL 0.80 0.68 0.63  Sodium 135 - 145 mmol/L 141 141 142  Potassium 3.5 - 5.1  mmol/L 4.2 4.4 3.9  Chloride 98 - 111 mmol/L 105 104 108  CO2 22 - 32 mmol/L 30 30 28   Calcium 8.9 - 10.3 mg/dL 8.8(L) 8.9 8.6(L)  Total Protein 6.5 - 8.1 g/dL - 6.5 -  Total Bilirubin 0.3 - 1.2 mg/dL - 1.0 -  Alkaline Phos 38 - 126 U/L - 52 -  AST 15 - 41 U/L - 18 -  ALT 0 - 44 U/L - 20 -    Imaging: No results found.  Assessment/Plan:   Active Problems:   Atrial flutter with rapid ventricular response ([place])   Patient Summary:  Jason Carter is a 75 y.o. 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  Atrial flutter with RVR Patient continues to feel well. On 1 L [place] for comfort. Overnight, heart rate 60s-82 on cardizem drip at 5-7.5 mg/hr with BP 100s-124/60s-80s and after metoprolol 25 mg twice yesterday. Will increase metoprolol to 50 mg twice daily today and work to wean cardizem drip. As for etiology,  TTE will be telling. Patient's history of lung cancer and previous daily alcohol use may be contributing. - TTE  - increase metoprolol to 50 mg twice daily - wean cardizem drip - PO anticoagulation per pharmacy: start rivaroxaban (Xarelto) 20 mg daily, discontinue heparin - daily weights, strict I/O - Daily BMP, Mg  Normocytic anemia On admission, hemoglobin of 12.1, MCV of 92.8. Hgb since 11.5>11.1. No sign/symptoms of blood loss at this time. Patient denies blood in urine or dark tarry/bright red stools. Iron studies suggest anemia of chronic disease. Retic count 1.4%, ferritin 161, iron 57, Tsat 18. - AM CBC  Anxiety, Insomnia Patient endorses increased anxiety over the last few months. Endorses periodic racing thoughts and worrying,  especially about his health. Discussed possibility of starting anxiolytic, and he states he would like to think more about it. Patient requests sleep aid. - Start ramelteon 8 mg QHS PRN - Consider outpatient referral to [facility] behavioral health counselor  History of alcohol use Will discontinue CIWA as patient has not had any signs or symptoms of alcohol withdrawal.  History of stage Ia NSCLC of the left upper lobe lung s/p radiation treatment Follow-up with oncology outpatient  Diet: Heart Healthy IVF: none VTE: Xarelto Code: Full   Please contact the on call pager after 5 pm and on weekends at [phone].  Alexandria Lodge, MD PGY-1 Internal Medicine Teaching Service Pager: [phone] 11/08/2020

## 2020-11-08 NOTE — Discharge Summary (Signed)
Name: Jason Carter MRN: [id number] DOB: September 22, 1945 75 y.o. PCP: Jason Mulligan, MD  Date of Admission: 11/06/2020  3:22 PM Date of Discharge: 11/14/2020 Attending Physician: Dr. Lalla Carter  Discharge Diagnosis:  1. Atrial flutter with rapid ventricular response 2. Aortic atherosclerosis 3. Emphysema lung 4. Normocytic anemia  Discharge Medications: Allergies as of 11/14/2020   No Known Allergies     Medication List    TAKE these medications   albuterol 108 (90 Base) MCG/ACT inhaler Commonly known as: VENTOLIN HFA Inhale 1-2 puffs into the lungs every 6 (six) hours as needed for wheezing or shortness of breath.   ARTHRITIS PAIN RELIEF EX Apply 1 application topically daily as needed (knee pain).   diclofenac Sodium 1 % Gel Commonly known as: VOLTAREN Apply 2 g topically 4 (four) times daily. What changed:   when to take this  reasons to take this   fluticasone 50 MCG/ACT nasal spray Commonly known as: Flonase Place 1 spray into both nostrils daily. What changed:   when to take this  reasons to take this   hydrocortisone cream 1 % Apply 1 application topically daily as needed for itching.   ibuprofen 200 MG tablet Commonly known as: ADVIL Take 200 mg by mouth every 6 (six) hours as needed for mild pain or headache.   multivitamin with minerals Tabs tablet Take 1 tablet by mouth daily.   neomycin-bacitracin-polymyxin ointment Commonly known as: NEOSPORIN Apply 1 application topically as needed for wound care.   rivaroxaban 20 MG Tabs tablet Commonly known as: XARELTO Take 1 tablet (20 mg total) by mouth daily with supper.            Durable Medical Equipment  (From admission, onward)         Start     Ordered   11/14/20 1419  For home use only DME oxygen  Once       Question Answer Comment  Length of Need 6 Months   Mode or (Route) Nasal cannula   Liters per Minute 2   Frequency Continuous (stationary and portable oxygen unit needed)    Oxygen conserving device Yes   Oxygen delivery system Gas      11/14/20 1427          Disposition and follow-up:   Jason Carter was discharged from [facility] in Stable condition.  At the hospital follow up visit please address:  Atrial flutter with rapid ventricular response s/p TEE-guided cardioversion 11/13/20, now in sinus rhythm - s/p TEE/DCCV on 4/25, in normal sinus rhythm on discharge - Patient is scheduled to follow-up with cardiology on 11/23/20 - Ensure adherence to Xarelto 20 mg daily  Emphysema, high pack year smoking history. Pt's son had noted that his father was complaining of shortness of breath for several months. - Patient qualified for 2 L [place] and was discharged on home oxygen.  - consider obtaining PFTs - reassess symptoms and evaluate for need of a daily maintenance inhaler  Anxiety, Insomnia - Patient expressed feeling anxious during this hospitalization however did not want treatment - GAD-7 at follow-up - Consider referral to [facility] behavioral counselor  Normocytic anemia - Hemoglobin 10.4 on day of discharge - Iron studies suggest anemia of chronic disease. Retic count 1.4%, ferritin 161, iron 57, Tsat 18. - Repeat CBC at follow-up  2.  Labs / imaging needed at time of follow-up: CBC  3.  Pending labs/ test needing follow-up: none  Follow-up Appointments:  Follow-up Information  [facility]. Go to.   Specialty: Cardiology Why: 5/5 @2pm . [facility] ([facility]. FREE valet parking at [employer], off [address] all medications with you. You will see a doctor, pharmacist and nurse. Contact information: [address] [id number] [place] [phone]       [facility] up.   Specialty: [place] Why: HHRN arranged- they will contact you to set up initial home visit Contact  information: [place] [address] [phone]        Jason Mulligan, MD. Schedule an appointment as soon as possible for a visit in 1 week(s).   Specialty: Internal Medicine Contact information: [address] [place] [address] [phone]        Jason Perla, MD .   Specialty: Cardiology Contact information: 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   Atrial flutter with RVR S/p TEE/DCCV 4/25 now in normal sinus rhythm He does not feel palpitations or chest pain.  He noted that the SOB started 1-2 weeks ago and has worsened.  Further notes some LE swelling and a rash with red dots.  He denies any further symptoms.  He noted having this happen one time before in September when he was diagnosed with lung cancer.  He had an EKG which showed A flutter in the clinic, D dimer was elevated and he was sent to the ED.  CTA of the chest did not show a PE, did show a stable nodule which represented patient's treated neoplasm and also changes of atherosclerosis and emphysema.  No fluid was noted.  Blood work was relatively normal with a mildly low albumin, TnI of 10.  EKG repeated was consistent with atrial tachycardia, a flutter 2:1 conduction.  He was initially started on diltiazem drip and heparin. Echo on 11/08/20 showed severe R atrial dilation. Suspect this is due to patient's long smoking history and likely underlying lung disease. His heart rate was difficult to control during this admission due to patient's soft BP. On the morning of 11/11/20, he developed hypotension with MAP ~55 after receiving metoprolol 100 mg and carvedilol 60 mg. BP improved with 1 L LR bolus though was  still soft. Cardiology was consulted and agreed with decreasing metoprolol and cardizem and given difficulty in controlling HR arranged for TEE-guided cardioversion on Monday, 11/13/20. Prior to cardioversion, he was started on amiodarone and metop/dilt were discontinued. He had successful cardioversion on 11/13/20 after which amiodarone was discontinued. He was discharged home on 2 L [place] and close cardiology follow-up.  Emphysema, high pack year smoking history. Pt's son had noted that his father was complaining of shortness of breath for several months. Patient qualified for 2 L [place] and was discharged on home oxygen. At follow-up, consider obtaining PFTs.  Anxiety, Insomnia During admission, patient reported increased anxiety over the last few months. Endorsed periodic racing thoughts and worrying, especially about his health. Discussed possibility of starting anxiolytic, and he states he would like to think more about it. He requested a sleep aid, and  ramelteon was made available however patient did not require during admission. Consider outpatient referral to [facility] behavioral health counselor  Normocytic anemia On admission, hemoglobin of 12.1, MCV of 92.8. Hgb since 11.5>11.1. No sign/symptoms of blood loss at this time. Patient denies blood in urine or dark tarry/bright red stools. Iron studies suggest anemia of chronic disease. Retic count 1.4%, ferritin 161, iron 57, Tsat 18. Hemoglobin was monitored closely during admission and was 10.4 on day of discharge  Stable lung nodules 81mm right middle lobe lung nodule, 9x37mm irregular nodule in the left apex, history of NSCLC stage 1 s/p radiotherapy 9x45mm left apex nodule reported to be stable. I reviewed the Super D chest CT from 06/2020 which suggests that the 59mm nodule is also unchanged in size.   ~~~~~~~~~~~~~~~~~~~~~~~~~~~~~~~~~~~~~~~~~~~~~~~~~~~~~~~~~~~~~~~~~~~~~~~~~~~~~~~~~~~~~~~  Day of Discharge Subjective:  No acute events overnight.  During evaluation this morning, Jason Carter states he is feeling well. Reports some soreness of his chest after the cardioversion. Reports his sputum is intermittently difficult to expectorate.  Discharge Exam:   BP 108/70 (BP Location: Left Arm)   Pulse 80   Temp 98 F (36.7 C) (Oral)   Resp 18   Ht 5\' 7"  (1.702 m)   Wt 70.2 kg   SpO2 96%   BMI 24.25 kg/m  Constitutional: thin, elderly man lying in bed, in no acute distress HENT: normocephalic atraumatic, mucous membranes moist Cardiovascular: tachycardic, no m/r/g, no lower extremity edema Pulmonary/Chest: normal work of breathing on 2 L [place], scattered faint rhonchi, periodic productive cough  Pertinent Labs, Studies, and Procedures:   DG Chest 2 View  Result Date: 11/06/2020 CLINICAL DATA:  Shortness of breath, tachycardia. EXAM: CHEST - 2 VIEW COMPARISON:  July 03, 2020. FINDINGS: The heart size and mediastinal contours are within normal limits. No pneumothorax or pleural effusion is noted. Stable left upper lobe scarring is noted. No acute abnormality is noted. Probable old sternal fracture is noted. No acute osseous abnormality is noted. IMPRESSION: No active cardiopulmonary disease. Aortic Atherosclerosis (ICD10-I70.0). Electronically Signed   By: Marijo Conception M.D.   On: 11/06/2020 16:13   DG Wrist Complete Right  Result Date: 11/06/2020 CLINICAL DATA:  Right wrist swelling and sprain. EXAM: RIGHT WRIST - COMPLETE 3+ VIEW COMPARISON:  August 08, 2020. FINDINGS: There is no evidence of fracture or dislocation. Lucency is seen in the distal ulna which is stable compared to prior exam. Joint spaces are unremarkable. Soft tissues are unremarkable. IMPRESSION: Stable lucency seen in distal ulna compared to prior exam. Metastatic focus cannot be excluded. No other significant abnormality is seen. Electronically Signed   By: Marijo Conception M.D.   On: 11/06/2020 16:16   CT Angio Chest PE W and/or Wo Contrast  Result Date:  11/06/2020 CLINICAL DATA:  Shortness of breath, evaluate for PE. History of non-small cell lung cancer, status post radiation. EXAM: CT ANGIOGRAPHY CHEST WITH CONTRAST TECHNIQUE: Multidetector CT imaging of the chest was performed using the standard protocol during bolus administration of intravenous contrast. Multiplanar CT image reconstructions and MIPs were obtained to evaluate the vascular anatomy. CONTRAST:  32mL OMNIPAQUE IOHEXOL 350 MG/ML SOLN COMPARISON:  CT chest dated 09/11/2020 FINDINGS: Cardiovascular: Satisfactory opacification of the bilateral pulmonary arteries to the segmental level. No evidence of pulmonary embolism. No evidence of thoracic aortic aneurysm. Atherosclerotic calcifications of the aortic arch. The heart is normal in size.  No pericardial effusion. Mediastinum/Nodes: No suspicious mediastinal lymphadenopathy. Visualized thyroid is unremarkable. Lungs/Pleura: 9 x 11 mm irregular nodule  in the left lung apex (series 6/image 28), unchanged. 8 mm right middle lobe nodule (series 6/image 93), unchanged. Additional scattered calcified and noncalcified pulmonary nodules, including clustered calcified granulomata in the superior segment left lower lobe (series 6/image 67), benign. Moderate centrilobular and paraseptal emphysematous changes, upper lung predominant. No focal consolidation. No pleural effusion or pneumothorax. Upper Abdomen: Visualized upper abdomen is notable for calcified splenic granulomata and vascular calcifications. Musculoskeletal: Degenerative changes of the visualized thoracolumbar spine. Review of the MIP images confirms the above findings. IMPRESSION: No evidence of pulmonary embolism. Stable 9 x 11 mm irregular nodule in the left lung apex, likely corresponding to the patient's treated primary bronchogenic neoplasm. No findings suspicious for metastatic disease. Aortic Atherosclerosis (ICD10-I70.0) and Emphysema (ICD10-J43.9). Electronically Signed   By: Julian Hy M.D.   On: 11/06/2020 17:48   ECHOCARDIOGRAM COMPLETE  Result Date: 11/08/2020    ECHOCARDIOGRAM REPORT   Patient Name:   EWART CARRERA Date of Exam: 11/08/2020 Medical Rec #:  [id number]   Height:       67.0 in Accession #:    [id number]  Weight:       158.1 lb Date of Birth:  [birth year]   BSA:          1.830 m Patient Age:    76 years    BP:           93/66 mmHg Patient Gender: M           HR:           113 bpm. Exam Location:  Inpatient Procedure: 2D Echo, Cardiac Doppler and Color Doppler                       STAT ECHO Reported to: Dr Fransico Him on 11/08/2020 5:33:00 PM. Indications:    I48.92* Unspecified atrial flutter  History:        Patient has no prior history of Echocardiogram examinations.                 Signs/Symptoms:Dyspnea. Cancer.  Sonographer:    Jonelle Sidle Dance Referring Phys: [id number] [place]  1. Left ventricular ejection fraction, by estimation, is 50 to 55%. The left ventricle has low normal function. The left ventricle has no regional wall motion abnormalities. Left ventricular diastolic function could not be evaluated.  2. Right ventricular systolic function is mildly reduced. The right ventricular size is mildly enlarged. There is normal pulmonary artery systolic pressure. The estimated right ventricular systolic pressure is 82.5 mmHg.  3. Right atrial size was severely dilated.  4. The mitral valve is normal in structure. Mild mitral valve regurgitation. No evidence of mitral stenosis.  5. Tricuspid valve regurgitation is mild to moderate.  6. The aortic valve is tricuspid. Aortic valve regurgitation is not visualized. Mild aortic valve sclerosis is present, with no evidence of aortic valve stenosis.  7. Aortic dilatation noted. There is mild dilatation of the aortic root, measuring 41 mm.  8. The inferior vena cava is dilated in size with >50% respiratory variability, suggesting right atrial pressure of 8 mmHg. FINDINGS  Left Ventricle: Left ventricular ejection  fraction, by estimation, is 50 to 55%. The left ventricle has low normal function. The left ventricle has no regional wall motion abnormalities. The left ventricular internal cavity size was normal in size. There is no left ventricular hypertrophy. Left ventricular diastolic function could not be evaluated due to atrial fibrillation. Left ventricular diastolic function could  not be evaluated. Right Ventricle: The right ventricular size is mildly enlarged. No increase in right ventricular wall thickness. Right ventricular systolic function is mildly reduced. There is normal pulmonary artery systolic pressure. The tricuspid regurgitant velocity  is 2.56 m/s, and with an assumed right atrial pressure of 8 mmHg, the estimated right ventricular systolic pressure is 81.0 mmHg. Left Atrium: Left atrial size was normal in size. Right Atrium: Right atrial size was severely dilated. Pericardium: There is no evidence of pericardial effusion. Mitral Valve: The mitral valve is normal in structure. Mild mitral valve regurgitation. No evidence of mitral valve stenosis. Tricuspid Valve: The tricuspid valve is normal in structure. Tricuspid valve regurgitation is mild to moderate. No evidence of tricuspid stenosis. Aortic Valve: The aortic valve is tricuspid. Aortic valve regurgitation is not visualized. Mild aortic valve sclerosis is present, with no evidence of aortic valve stenosis. Pulmonic Valve: The pulmonic valve was normal in structure. Pulmonic valve regurgitation is trivial. No evidence of pulmonic stenosis. Aorta: Aortic dilatation noted. There is mild dilatation of the aortic root, measuring 41 mm. Venous: The inferior vena cava is dilated in size with greater than 50% respiratory variability, suggesting right atrial pressure of 8 mmHg. IAS/Shunts: No atrial level shunt detected by color flow Doppler.  LEFT VENTRICLE PLAX 2D LVIDd:         5.00 cm LVIDs:         3.50 cm LV PW:         1.30 cm LV IVS:        1.00 cm LVOT  diam:     2.00 cm LV SV:         45 LV SV Index:   24 LVOT Area:     3.14 cm  RIGHT VENTRICLE          IVC RV Basal diam:  3.90 cm  IVC diam: 2.20 cm RV Mid diam:    2.40 cm TAPSE (M-mode): 1.6 cm LEFT ATRIUM             Index       RIGHT ATRIUM           Index LA diam:        4.20 cm 2.30 cm/m  RA Area:     24.90 cm LA Vol (A2C):   68.8 ml 37.60 ml/m RA Volume:   93.20 ml  50.93 ml/m LA Vol (A4C):   41.0 ml 22.40 ml/m LA Biplane Vol: 57.0 ml 31.15 ml/m  AORTIC VALVE LVOT Vmax:   75.00 cm/s LVOT Vmean:  54.350 cm/s LVOT VTI:    0.142 m  AORTA Ao Root diam: 4.10 cm Ao Asc diam:  3.50 cm MITRAL VALVE               TRICUSPID VALVE MV Area (PHT): 4.49 cm    TR Peak grad:   26.2 mmHg MV Decel Time: 169 msec    TR Vmax:        256.00 cm/s MV E velocity: 76.60 cm/s MV A velocity: 50.30 cm/s  SHUNTS MV E/A ratio:  1.52        Systemic VTI:  0.14 m                            Systemic Diam: 2.00 cm Fransico Him MD Electronically signed by Fransico Him MD Signature Date/Time: 11/08/2020/6:10:37 PM    Final    ECHO TEE  Result Date: 11/13/2020  TRANSESOPHOGEAL ECHO REPORT   Patient Name:   CREEK GAN Date of Exam: 11/13/2020 Medical Rec #:  [id number]   Height:       67.0 in Accession #:    [id number]  Weight:       156.1 lb Date of Birth:  [birth year]   BSA:          1.820 m Patient Age:    13 years    BP:           94/53 mmHg Patient Gender: M           HR:           123 bpm. Exam Location:  Inpatient Procedure: Transesophageal Echo, Cardiac Doppler and Color Doppler Indications:    Atrial flutter  History:        Patient has prior history of Echocardiogram examinations, most                 recent 11/08/2020. Signs/Symptoms:Dyspnea. Cancer.  Sonographer:    Clayton Lefort RDCS (AE) Referring Phys: [place]: After discussion of the risks and benefits of a TEE, an informed consent was obtained from the patient. The transesophogeal probe was passed without difficulty through the esophogus of the  patient. Sedation performed by different physician. The patient was monitored while under deep sedation. Anesthestetic sedation was provided intravenously by Anesthesiology: 119.12mg  of Propofol. Image quality was good. The patient's vital signs; including heart rate, blood pressure, and oxygen saturation; remained stable throughout the procedure. The patient developed no complications during the procedure. A successful direct current cardioversion was performed at 150 joules with 1 attempt. IMPRESSIONS  1. Left ventricular ejection fraction, by estimation, is 40 to 45%. The left ventricle has mildly decreased function. The left ventricle has no regional wall motion abnormalities.  2. Right ventricular systolic function is mildly reduced. The right ventricular size is mildly enlarged.  3. Left atrial size was moderately dilated. No left atrial/left atrial appendage thrombus was detected. The LAA emptying velocity was 60 cm/s.  4. Right atrial size was severely dilated.  5. The mitral valve is normal in structure. Mild mitral valve regurgitation. No evidence of mitral stenosis.  6. Tricuspid valve regurgitation is mild to moderate.  7. The aortic valve is normal in structure. Aortic valve regurgitation is not visualized. No aortic stenosis is present.  8. Hypermobile interatrial septum with bulging of the septum to the LA consistent with increased right atrial presssure.  9. There is mild (Grade II) layered plaque involving the descending aorta, transverse aorta and ascending aorta. Conclusion(s)/Recommendation(s): No LA/LAA thrombus identified. Successful cardioversion performed with restoration of normal sinus rhythm. No LA or LAA thrombus. Patient went on to DCCV. FINDINGS  Left Ventricle: Left ventricular ejection fraction, by estimation, is 40 to 45%. The left ventricle has mildly decreased function. The left ventricle has no regional wall motion abnormalities. The left ventricular internal cavity size was  normal in size. There is no left ventricular hypertrophy. Right Ventricle: The right ventricular size is mildly enlarged. No increase in right ventricular wall thickness. Right ventricular systolic function is mildly reduced. Left Atrium: Left atrial size was moderately dilated. Spontaneous echo contrast was present in the left atrium. No left atrial/left atrial appendage thrombus was detected. The LAA emptying velocity was 60 cm/s. Right Atrium: Right atrial size was severely dilated. Pericardium: There is no evidence of pericardial effusion. Mitral Valve: The mitral valve is normal in structure. Mild mitral valve regurgitation. No evidence  of mitral valve stenosis. Tricuspid Valve: The tricuspid valve is normal in structure. Tricuspid valve regurgitation is mild to moderate. No evidence of tricuspid stenosis. Aortic Valve: The aortic valve is normal in structure. Aortic valve regurgitation is not visualized. No aortic stenosis is present. Pulmonic Valve: The pulmonic valve was normal in structure. Pulmonic valve regurgitation is not visualized. No evidence of pulmonic stenosis. Aorta: The aortic root is normal in size and structure. There is mild (Grade II) layered plaque involving the descending aorta, transverse aorta and ascending aorta. Venous: The inferior vena cava was not well visualized. IAS/Shunts: No atrial level shunt detected by color flow Doppler. Hypermobile interatrial septum with bulging of the septum to the LA consistent with increased right atrial presssure. Fransico Him MD Electronically signed by Fransico Him MD Signature Date/Time: 11/13/2020/11:49:02 AM    Final     Discharge Instructions: Discharge Instructions    ([place]) Call MD:  Anytime you have any of the following symptoms: 1) 3 pound weight gain in 24 hours or 5 pounds in 1 week 2) shortness of breath, with or without a dry hacking cough 3) swelling in the hands, feet or stomach 4) if you have to sleep on extra  pillows at night in order to breathe.   Complete by: As directed    Amb referral to AFIB Clinic   Complete by: As directed    Call MD for:  difficulty breathing, headache or visual disturbances   Complete by: As directed    Call MD for:  extreme fatigue   Complete by: As directed    Call MD for:  persistant dizziness or light-headedness   Complete by: As directed    Call MD for:  severe uncontrolled pain   Complete by: As directed    Call MD for:  temperature >100.4   Complete by: As directed      Jason Carter,  It was a pleasure taking care of you in the hospital. You were admitted with shortness of breath and found to have an abnormal heart rhythm and elevated heart rate. The heart rhythm is called atrial flutter. We took a picture of you heart which showed enlargement of your right atrium, which is likely from underlying lung disease (history of smoking and history of lung cancer). You were first treated with medications to control your heart rate, however ultimately you required a procedure to reset your heart rhythm, called a cardioversion. Your heart is now in regular rhythm. It is very important that you follow-up with the heart doctors. You were also started on a blood thinner, Xarelto, which you need to continue taking daily.  In addition to following up with cardiology, please follow-up in the internal medicine clinic. Someone will call you to schedule an appointment for hospital follow-up.  Take care!   Signed: Alexandria Lodge, MD 11/14/2020, 2:50 PM   Pager: [phone]

## 2020-11-09 ENCOUNTER — Encounter (HOSPITAL_COMMUNITY): Payer: Self-pay | Admitting: Internal Medicine

## 2020-11-09 DIAGNOSIS — Z87891 Personal history of nicotine dependence: Secondary | ICD-10-CM

## 2020-11-09 DIAGNOSIS — D509 Iron deficiency anemia, unspecified: Secondary | ICD-10-CM | POA: Diagnosis present

## 2020-11-09 DIAGNOSIS — I700 Atherosclerosis of aorta: Secondary | ICD-10-CM | POA: Diagnosis present

## 2020-11-09 DIAGNOSIS — D649 Anemia, unspecified: Secondary | ICD-10-CM | POA: Diagnosis present

## 2020-11-09 DIAGNOSIS — J439 Emphysema, unspecified: Secondary | ICD-10-CM | POA: Diagnosis present

## 2020-11-09 DIAGNOSIS — J9611 Chronic respiratory failure with hypoxia: Secondary | ICD-10-CM | POA: Diagnosis present

## 2020-11-09 LAB — CBC
HCT: 35.9 % — ABNORMAL LOW (ref 39.0–52.0)
Hemoglobin: 11.2 g/dL — ABNORMAL LOW (ref 13.0–17.0)
MCH: 29.2 pg (ref 26.0–34.0)
MCHC: 31.2 g/dL (ref 30.0–36.0)
MCV: 93.5 fL (ref 80.0–100.0)
Platelets: 178 10*3/uL (ref 150–400)
RBC: 3.84 MIL/uL — ABNORMAL LOW (ref 4.22–5.81)
RDW: 14.2 % (ref 11.5–15.5)
WBC: 8.3 10*3/uL (ref 4.0–10.5)
nRBC: 0 % (ref 0.0–0.2)

## 2020-11-09 LAB — BASIC METABOLIC PANEL WITH GFR
Anion gap: 5 (ref 5–15)
BUN: 18 mg/dL (ref 8–23)
CO2: 29 mmol/L (ref 22–32)
Calcium: 9 mg/dL (ref 8.9–10.3)
Chloride: 106 mmol/L (ref 98–111)
Creatinine, Ser: 0.69 mg/dL (ref 0.61–1.24)
GFR, Estimated: >60 mL/min
Glucose, Bld: 110 mg/dL — ABNORMAL HIGH (ref 70–99)
Potassium: 4.5 mmol/L (ref 3.5–5.1)
Sodium: 140 mmol/L (ref 135–145)

## 2020-11-09 MED ORDER — METOPROLOL TARTRATE 50 MG PO TABS
50.0000 mg | ORAL_TABLET | Freq: Once | ORAL | Status: AC
  Administered 2020-11-09: 50 mg via ORAL
  Filled 2020-11-09: qty 1

## 2020-11-09 MED ORDER — METOPROLOL TARTRATE 50 MG PO TABS: 50.0000 mg | ORAL_TABLET | Freq: Two times a day (BID) | ORAL | Status: DC

## 2020-11-09 MED ORDER — METOPROLOL TARTRATE 25 MG PO TABS
25.0000 mg | ORAL_TABLET | Freq: Once | ORAL | Status: AC
  Administered 2020-11-09: 25 mg via ORAL
  Filled 2020-11-09: qty 1

## 2020-11-09 NOTE — Plan of Care (Signed)
  Problem: Clinical Measurements: Goal: Respiratory complications will improve Outcome: Progressing Goal: Cardiovascular complication will be avoided Outcome: Progressing   Problem: Activity: Goal: Risk for activity intolerance will decrease Outcome: Progressing   Problem: Nutrition: Goal: Adequate nutrition will be maintained Outcome: Progressing   

## 2020-11-09 NOTE — Progress Notes (Signed)
HD#3 Subjective:   Diltiazem drip discontinued around midnight. Heart rate noted to increase to the 130s early this morning.  HR remains in the 130s on rounds today. Denies associated symptoms. Discussed echocardiogram results. Discussed long-term follow up plans to follow up with cardiology after discharge.   Objective:   Vital signs in last 24 hours: Vitals:   11/09/20 0434 11/09/20 0503 11/09/20 0515 11/09/20 0637  BP: 127/90   130/82  Pulse: (!) 127  (!) 112 (!) 129  Resp: 16     Temp: (!) 97.4 F (36.3 C)     TempSrc: Oral     SpO2: 97%  96% 95%  Weight:  70 kg    Height:       Physical Exam Constitutional: thin, elderly man lying in bed, in no acute distress HENT: normocephalic atraumatic, mucous membranes moist Cardiovascular: tachycardic, regular rhythm, no m/r/g, mild edema to the ankles Pulmonary/Chest: normal work of breathing on 1 L [place], clear but with decreased sounds Neurological: alert & oriented x 3  Filed Weights   11/06/20 2316 11/08/20 0456 11/09/20 0503  Weight: 73.8 kg 71.7 kg 70 kg    Intake/Output Summary (Last 24 hours) at 11/09/2020 0647 Last data filed at 11/08/2020 1800 Gross per 24 hour  Intake 821.29 ml  Output 200 ml  Net 621.29 ml   Net IO Since Admission: 863.38 mL [11/09/20 0647]  Pertinent Labs: CBC Latest Ref Rng & Units 11/09/2020 11/08/2020 11/07/2020  WBC 4.0 - 10.5 K/uL 8.3 9.2 7.9  Hemoglobin 13.0 - 17.0 g/dL 11.2(L) 11.1(L) 11.5(L)  Hematocrit 39.0 - 52.0 % 35.9(L) 36.8(L) 38.0(L)  Platelets 150 - 400 K/uL 178 180 188    CMP Latest Ref Rng & Units 11/09/2020 11/08/2020 11/07/2020  Glucose 70 - 99 mg/dL 110(H) 113(H) 96  BUN 8 - 23 mg/dL 18 14 8   Creatinine 0.61 - 1.24 mg/dL 0.69 0.80 0.68  Sodium 135 - 145 mmol/L 140 141 141  Potassium 3.5 - 5.1 mmol/L 4.5 4.2 4.4  Chloride 98 - 111 mmol/L 106 105 104  CO2 22 - 32 mmol/L 29 30 30   Calcium 8.9 - 10.3 mg/dL 9.0 8.8(L) 8.9  Total Protein 6.5 - 8.1 g/dL - - 6.5  Total  Bilirubin 0.3 - 1.2 mg/dL - - 1.0  Alkaline Phos 38 - 126 U/L - - 52  AST 15 - 41 U/L - - 18  ALT 0 - 44 U/L - - 20    Imaging: ECHOCARDIOGRAM COMPLETE  Result Date: 11/08/2020    ECHOCARDIOGRAM REPORT   Patient Name:   WYLEY HACK Date of Exam: 11/08/2020 Medical Rec #:  [id number]   Height:       67.0 in Accession #:    [id number]  Weight:       158.1 lb Date of Birth:  August 10, 1945   BSA:          1.830 m Patient Age:    75 years    BP:           93/66 mmHg Patient Gender: M           HR:           113 bpm. Exam Location:  Inpatient Procedure: 2D Echo, Cardiac Doppler and Color Doppler                       STAT ECHO Reported to: Dr Fransico Him on 11/08/2020 5:33:00 PM. Indications:    R67.89* Unspecified  atrial flutter  History:        Patient has no prior history of Echocardiogram examinations.                 Signs/Symptoms:Dyspnea. Cancer.  Sonographer:    Jonelle Sidle Dance Referring Phys: [id number] [place]  1. Left ventricular ejection fraction, by estimation, is 50 to 55%. The left ventricle has low normal function. The left ventricle has no regional wall motion abnormalities. Left ventricular diastolic function could not be evaluated.  2. Right ventricular systolic function is mildly reduced. The right ventricular size is mildly enlarged. There is normal pulmonary artery systolic pressure. The estimated right ventricular systolic pressure is 63.8 mmHg.  3. Right atrial size was severely dilated.  4. The mitral valve is normal in structure. Mild mitral valve regurgitation. No evidence of mitral stenosis.  5. Tricuspid valve regurgitation is mild to moderate.  6. The aortic valve is tricuspid. Aortic valve regurgitation is not visualized. Mild aortic valve sclerosis is present, with no evidence of aortic valve stenosis.  7. Aortic dilatation noted. There is mild dilatation of the aortic root, measuring 41 mm.  8. The inferior vena cava is dilated in size with >50% respiratory variability,  suggesting right atrial pressure of 8 mmHg. FINDINGS  Left Ventricle: Left ventricular ejection fraction, by estimation, is 50 to 55%. The left ventricle has low normal function. The left ventricle has no regional wall motion abnormalities. The left ventricular internal cavity size was normal in size. There is no left ventricular hypertrophy. Left ventricular diastolic function could not be evaluated due to atrial fibrillation. Left ventricular diastolic function could not be evaluated. Right Ventricle: The right ventricular size is mildly enlarged. No increase in right ventricular wall thickness. Right ventricular systolic function is mildly reduced. There is normal pulmonary artery systolic pressure. The tricuspid regurgitant velocity  is 2.56 m/s, and with an assumed right atrial pressure of 8 mmHg, the estimated right ventricular systolic pressure is 75.6 mmHg. Left Atrium: Left atrial size was normal in size. Right Atrium: Right atrial size was severely dilated. Pericardium: There is no evidence of pericardial effusion. Mitral Valve: The mitral valve is normal in structure. Mild mitral valve regurgitation. No evidence of mitral valve stenosis. Tricuspid Valve: The tricuspid valve is normal in structure. Tricuspid valve regurgitation is mild to moderate. No evidence of tricuspid stenosis. Aortic Valve: The aortic valve is tricuspid. Aortic valve regurgitation is not visualized. Mild aortic valve sclerosis is present, with no evidence of aortic valve stenosis. Pulmonic Valve: The pulmonic valve was normal in structure. Pulmonic valve regurgitation is trivial. No evidence of pulmonic stenosis. Aorta: Aortic dilatation noted. There is mild dilatation of the aortic root, measuring 41 mm. Venous: The inferior vena cava is dilated in size with greater than 50% respiratory variability, suggesting right atrial pressure of 8 mmHg. IAS/Shunts: No atrial level shunt detected by color flow Doppler.  LEFT VENTRICLE PLAX 2D  LVIDd:         5.00 cm LVIDs:         3.50 cm LV PW:         1.30 cm LV IVS:        1.00 cm LVOT diam:     2.00 cm LV SV:         45 LV SV Index:   24 LVOT Area:     3.14 cm  RIGHT VENTRICLE          IVC RV Basal diam:  3.90  cm  IVC diam: 2.20 cm RV Mid diam:    2.40 cm TAPSE (M-mode): 1.6 cm LEFT ATRIUM             Index       RIGHT ATRIUM           Index LA diam:        4.20 cm 2.30 cm/m  RA Area:     24.90 cm LA Vol (A2C):   68.8 ml 37.60 ml/m RA Volume:   93.20 ml  50.93 ml/m LA Vol (A4C):   41.0 ml 22.40 ml/m LA Biplane Vol: 57.0 ml 31.15 ml/m  AORTIC VALVE LVOT Vmax:   75.00 cm/s LVOT Vmean:  54.350 cm/s LVOT VTI:    0.142 m  AORTA Ao Root diam: 4.10 cm Ao Asc diam:  3.50 cm MITRAL VALVE               TRICUSPID VALVE MV Area (PHT): 4.49 cm    TR Peak grad:   26.2 mmHg MV Decel Time: 169 msec    TR Vmax:        256.00 cm/s MV E velocity: 76.60 cm/s MV A velocity: 50.30 cm/s  SHUNTS MV E/A ratio:  1.52        Systemic VTI:  0.14 m                            Systemic Diam: 2.00 cm Fransico Him MD Electronically signed by Fransico Him MD Signature Date/Time: 11/08/2020/6:10:37 PM    Final     Assessment/Plan:   Active Problems:   Atrial flutter with rapid ventricular response ([place])   Patient Summary:  Stevin Bielinski is a 75 y.o. 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  Atrial flutter with RVR Echo yesterday showed severe R atrial dilation. Suspect this is due to patient's long smoking history and likely underlying lung disease. Patient continues to feel well. On 1 L  for comfort. Overnight, diltiazem drip discontinued around midnight, and  patient's HR heart rate sustained in the 60s until around 0430 when it increased back up to 120s-130s. Will redose metoprolol 25 mg and if patient's HR sustains <100 can discharge later this afternoon on once daily metoprolol succinate.  - s/p metoprolol 50 mg this morning - 25 mg metoprolol now - metoprolol 50 mg this evening - rivaroxaban (Xarelto) 20 mg daily - daily weights, strict I/O - Daily BMP, Mg  Anxiety, Insomnia Stable.  - ramelteon 8 mg QHS PRN - Consider outpatient referral to [facility] behavioral health counselor  Normocytic anemia Stable. History of alcohol use Will discontinue CIWA as patient has not had any signs or symptoms of alcohol withdrawal.  History of stage Ia NSCLC of the left upper lobe lung s/p radiation treatment Follow-up with oncology outpatient  Diet: Heart Healthy IVF: none VTE: Xarelto Code: Full   Please contact the on call pager after 5 pm and on weekends at [phone].  Alexandria Lodge, MD PGY-1 Internal Medicine Teaching Service Pager: [phone] 11/09/2020
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
## 2020-11-10 MED ORDER — METOPROLOL TARTRATE 100 MG PO TABS
100.0000 mg | ORAL_TABLET | Freq: Two times a day (BID) | ORAL | Status: DC
  Administered 2020-11-10 – 2020-11-11 (×3): 100 mg via ORAL
  Filled 2020-11-10 (×3): qty 1

## 2020-11-10 MED ORDER — DILTIAZEM HCL 60 MG PO TABS
30.0000 mg | ORAL_TABLET | Freq: Three times a day (TID) | ORAL | Status: DC
  Administered 2020-11-10: 30 mg via ORAL
  Filled 2020-11-10: qty 1

## 2020-11-10 MED ORDER — GUAIFENESIN 100 MG/5ML PO SOLN
5.0000 mL | ORAL | Status: DC | PRN
  Administered 2020-11-10 – 2020-11-13 (×9): 100 mg via ORAL
  Filled 2020-11-10 (×9): qty 5

## 2020-11-10 MED ORDER — FLUTICASONE PROPIONATE 50 MCG/ACT NA SUSP
1.0000 | Freq: Every day | NASAL | Status: DC
  Administered 2020-11-10 – 2020-11-14 (×5): 1 via NASAL
  Filled 2020-11-10 (×2): qty 16

## 2020-11-10 MED ORDER — DILTIAZEM HCL 60 MG PO TABS
60.0000 mg | ORAL_TABLET | Freq: Four times a day (QID) | ORAL | Status: DC
  Administered 2020-11-11: 60 mg via ORAL
  Filled 2020-11-10: qty 1

## 2020-11-10 MED ORDER — DILTIAZEM HCL 60 MG PO TABS: 30.0000 mg | ORAL_TABLET | Freq: Four times a day (QID) | ORAL | Status: DC

## 2020-11-10 MED ORDER — DILTIAZEM HCL 60 MG PO TABS
30.0000 mg | ORAL_TABLET | Freq: Once | ORAL | Status: AC
  Administered 2020-11-10: 30 mg via ORAL
  Filled 2020-11-10: qty 1

## 2020-11-10 NOTE — Care Management Important Message (Signed)
Important Message  Patient Details  Name: Jason Carter MRN: [id number] Date of Birth: 1946-01-25   Medicare Important Message Given:  Yes     Shelda Altes 11/10/2020, 11:01 AM

## 2020-11-10 NOTE — Progress Notes (Signed)
HD#4 Subjective:   Overnight, HR sustained 120s-130s.  During evaluation this morning, HR ~130. Patient states he cannot tell his heart rate is increased. Endorses feeling a "tingly feeling" in his chest at one point during the night but no chest pain. Reports he slept poorly overnight secondary to productive cough which started yesterday. States he has seasonal allergies but stopped taking his Flonase when symptoms improved.  Objective:   Vital signs in last 24 hours: Vitals:   11/09/20 2005 11/10/20 0023 11/10/20 0438 11/10/20 0631  BP: 125/82 128/84 103/70   Pulse: (!) 131 (!) 129    Resp:  20 18   Temp:  97.6 F (36.4 C) 97.7 F (36.5 C)   TempSrc:   Oral   SpO2:  97% 97%   Weight:    71.3 kg  Height:       Physical Exam Constitutional: thin, elderly man lying in bed, in no acute distress HENT: normocephalic atraumatic, mucous membranes moist Cardiovascular: tachycardic, regular rhythm, no m/r/g, trace edema to the ankles Pulmonary/Chest: normal work of breathing on 1 L [place], scattered faint rhonchi, periodic productive cough  Filed Weights   11/08/20 0456 11/09/20 0503 11/10/20 0631  Weight: 71.7 kg 70 kg 71.3 kg    Intake/Output Summary (Last 24 hours) at 11/10/2020 0708 Last data filed at 11/10/2020 0445 Gross per 24 hour  Intake 120 ml  Output 1620 ml  Net -1500 ml   Net IO Since Admission: -636.62 mL [11/10/20 0708]  Pertinent Labs: CBC Latest Ref Rng & Units 11/09/2020 11/08/2020 11/07/2020  WBC 4.0 - 10.5 K/uL 8.3 9.2 7.9  Hemoglobin 13.0 - 17.0 g/dL 11.2(L) 11.1(L) 11.5(L)  Hematocrit 39.0 - 52.0 % 35.9(L) 36.8(L) 38.0(L)  Platelets 150 - 400 K/uL 178 180 188    CMP Latest Ref Rng & Units 11/09/2020 11/08/2020 11/07/2020  Glucose 70 - 99 mg/dL 110(H) 113(H) 96  BUN 8 - 23 mg/dL 18 14 8   Creatinine 0.61 - 1.24 mg/dL 0.69 0.80 0.68  Sodium 135 - 145 mmol/L 140 141 141  Potassium 3.5 - 5.1 mmol/L 4.5 4.2 4.4  Chloride 98 - 111 mmol/L 106 105 104  CO2 22  - 32 mmol/L 29 30 30   Calcium 8.9 - 10.3 mg/dL 9.0 8.8(L) 8.9  Total Protein 6.5 - 8.1 g/dL - - 6.5  Total Bilirubin 0.3 - 1.2 mg/dL - - 1.0  Alkaline Phos 38 - 126 U/L - - 52  AST 15 - 41 U/L - - 18  ALT 0 - 44 U/L - - 20    Imaging: no new imaging  Assessment/Plan:   Active Problems:   Atrial flutter with rapid ventricular response (HCC)   Aortic atherosclerosis (HCC)   Emphysema lung (HCC)   Normocytic anemia   Patient Summary:  Jason Carter is a 75 y.o. 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  This is hospital day 4.  Atrial flutter with RVR Patient continues to feel well. On 1 L [place] for comfort. Yesterday, HR sustained 110s-130s on PO metoprolol alone. Given that his rate control was best while on diltiazem drip and PO metoprolol, will add PO diltiazem. - increase metoprolol to 100 mg twice daily -  start diltiazem 30 mg q8h  - rivaroxaban (Xarelto) 20 mg daily - daily weights, strict I/O - Daily BMP, Mg  Acute on chronic cough Patient reports cough productive of light yellow phlegm since yesterday. Scattered faint rhonchi on exam. Will treat symptomatically and monitor for additional symptoms. - guaifenesin PRN  Anxiety, Insomnia Stable. Ramelteon 8 mg QHS PRN available. Consider outpatient referral to [facility] behavioral health counselor. Normocytic anemia Stable. History of alcohol use Will discontinue CIWA as patient has not had any signs or symptoms of alcohol withdrawal.  History of stage Ia NSCLC of the left upper lobe lung s/p radiation treatment Follow-up with oncology outpatient  Diet: Heart Healthy IVF: none VTE:  Xarelto Code: Full   Please contact the on call pager after 5 pm and on weekends at [phone].  Alexandria Lodge, MD PGY-1 Internal Medicine Teaching Service Pager: [phone] 11/10/2020

## 2020-11-11 ENCOUNTER — Encounter (HOSPITAL_COMMUNITY): Payer: Self-pay | Admitting: Internal Medicine

## 2020-11-11 DIAGNOSIS — I483 Typical atrial flutter: Principal | ICD-10-CM

## 2020-11-11 LAB — CBC WITH DIFFERENTIAL/PLATELET
Abs Immature Granulocytes: 0.01 10*3/uL (ref 0.00–0.07)
Basophils Absolute: 0 10*3/uL (ref 0.0–0.1)
Basophils Relative: 1 %
Eosinophils Absolute: 0.1 10*3/uL (ref 0.0–0.5)
Eosinophils Relative: 2 %
HCT: 35.5 % — ABNORMAL LOW (ref 39.0–52.0)
Hemoglobin: 10.9 g/dL — ABNORMAL LOW (ref 13.0–17.0)
Immature Granulocytes: 0 %
Lymphocytes Relative: 14 %
Lymphs Abs: 0.9 10*3/uL (ref 0.7–4.0)
MCH: 28.4 pg (ref 26.0–34.0)
MCHC: 30.7 g/dL (ref 30.0–36.0)
MCV: 92.4 fL (ref 80.0–100.0)
Monocytes Absolute: 1 10*3/uL (ref 0.1–1.0)
Monocytes Relative: 15 %
Neutro Abs: 4.3 10*3/uL (ref 1.7–7.7)
Neutrophils Relative %: 68 %
Platelets: 146 10*3/uL — ABNORMAL LOW (ref 150–400)
RBC: 3.84 MIL/uL — ABNORMAL LOW (ref 4.22–5.81)
RDW: 14.2 % (ref 11.5–15.5)
WBC: 6.3 10*3/uL (ref 4.0–10.5)
nRBC: 0 % (ref 0.0–0.2)

## 2020-11-11 LAB — BASIC METABOLIC PANEL
Anion gap: 6 (ref 5–15)
BUN: 11 mg/dL (ref 8–23)
CO2: 32 mmol/L (ref 22–32)
Calcium: 8.7 mg/dL — ABNORMAL LOW (ref 8.9–10.3)
Chloride: 99 mmol/L (ref 98–111)
Creatinine, Ser: 0.69 mg/dL (ref 0.61–1.24)
GFR, Estimated: >60 mL/min (ref >60)
Glucose, Bld: 92 mg/dL (ref 70–99)
Potassium: 4.2 mmol/L (ref 3.5–5.1)
Sodium: 137 mmol/L (ref 135–145)

## 2020-11-11 MED ORDER — METOPROLOL TARTRATE 50 MG PO TABS
50.0000 mg | ORAL_TABLET | Freq: Two times a day (BID) | ORAL | Status: DC
  Administered 2020-11-11: 50 mg via ORAL
  Filled 2020-11-11 (×2): qty 1

## 2020-11-11 MED ORDER — DILTIAZEM HCL 60 MG PO TABS
30.0000 mg | ORAL_TABLET | Freq: Four times a day (QID) | ORAL | Status: DC
  Administered 2020-11-11 – 2020-11-12 (×2): 30 mg via ORAL
  Filled 2020-11-11 (×2): qty 1

## 2020-11-11 MED ORDER — LACTATED RINGERS IV BOLUS
1000.0000 mL | Freq: Once | INTRAVENOUS | Status: AC
  Administered 2020-11-11: 1000 mL via INTRAVENOUS

## 2020-11-11 NOTE — Plan of Care (Signed)
  Problem: Activity: Goal: Risk for activity intolerance will decrease Outcome: Progressing   Problem: Nutrition: Goal: Adequate nutrition will be maintained Outcome: Progressing   
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
## 2020-11-11 NOTE — Progress Notes (Signed)
At [facility] I saw the pts BP on the hallway monitor was 79/51 from autocycling BP's and came to assess the patient. He stated he was feeling a little tired and woozy. I laid his bed flat and elevated his legs and rechecked BP which was 71/47 and then 74/50. I paged the internal medicine teaching service and spoke to the resident.  Received orders to give LR Bolus which is infusing now. Current BP 72/51 (MAP 59) and MD at bedside. Pt says he feels a little better and is stable at this time. I am still at bedside. Will continue to monitor.

## 2020-11-11 NOTE — Progress Notes (Signed)
HD#5 Subjective:   Overnight, HR sustained 120s-130s. PO cardizem held in setting of systolic BP mid 56L.   During initial evaluation this morning, patient had received diltiazem 60 mg 30 minutes prior, and his BP noted to be 115/80 with P 100-115 during exam. Patient reported he was feeling well, slept better yesterday evening. Denied chest pain or shortness of breath. Reported cough improved with addition of Robitussin yesterday.  0948 returned page from RN who reported patient's BP found to be 74/50, and patient was endorsing feeling tired and woozy. Ordered LR bolus and presented to bedside to reevaluate patient. Patient lying flat, awake, alert, in no acute distress. He reports he felt tired but otherwise currently feels fine. Repeat BP 72/51 (MAP 59). Consulted cardiology who will see the patient.   Objective:   Vital signs in last 24 hours: Vitals:   11/10/20 2131 11/11/20 0009 11/11/20 0050 11/11/20 0353  BP: 123/89 110/69 99/66 97/72   Pulse: (!) 132 (!) 125 (!) 116 (!) 124  Resp:  18  18  Temp:  98.7 F (37.1 C)  98.2 F (36.8 C)  TempSrc:  Oral  Oral  SpO2:  95% 95% 96%  Weight:    71.1 kg  Height:       Physical Exam Constitutional: thin, elderly man lying in bed, in no acute distress HENT: normocephalic atraumatic, mucous membranes moist Cardiovascular: regular rate and rhythm, no m/r/g, no lower extremity edema Pulmonary/Chest: normal work of breathing on 1 L [place], scattered faint rhonchi, periodic productive cough  Filed Weights   11/09/20 0503 11/10/20 0631 11/11/20 0353  Weight: 70 kg 71.3 kg 71.1 kg    Intake/Output Summary (Last 24 hours) at 11/11/2020 0641 Last data filed at 11/11/2020 0516 Gross per 24 hour  Intake 570.85 ml  Output 450 ml  Net 120.85 ml   Net IO Since Admission: -515.77 mL [11/11/20 0641]  Pertinent Labs: CBC Latest Ref Rng & Units 11/11/2020 11/09/2020 11/08/2020  WBC 4.0 - 10.5 K/uL 6.3 8.3 9.2  Hemoglobin 13.0 - 17.0 g/dL 10.9(L)  11.2(L) 11.1(L)  Hematocrit 39.0 - 52.0 % 35.5(L) 35.9(L) 36.8(L)  Platelets 150 - 400 K/uL 146(L) 178 180    CMP Latest Ref Rng & Units 11/11/2020 11/09/2020 11/08/2020  Glucose 70 - 99 mg/dL 92 110(H) 113(H)  BUN 8 - 23 mg/dL 11 18 14   Creatinine 0.61 - 1.24 mg/dL 0.69 0.69 0.80  Sodium 135 - 145 mmol/L 137 140 141  Potassium 3.5 - 5.1 mmol/L 4.2 4.5 4.2  Chloride 98 - 111 mmol/L 99 106 105  CO2 22 - 32 mmol/L 32 29 30  Calcium 8.9 - 10.3 mg/dL 8.7(L) 9.0 8.8(L)  Total Protein 6.5 - 8.1 g/dL - - -  Total Bilirubin 0.3 - 1.2 mg/dL - - -  Alkaline Phos 38 - 126 U/L - - -  AST 15 - 41 U/L - - -  ALT 0 - 44 U/L - - -    Imaging: no new imaging  Assessment/Plan:   Active Problems:   Atrial flutter with rapid ventricular response (HCC)   Aortic atherosclerosis (HCC)   Emphysema lung (HCC)   Normocytic anemia   Patient Summary:  Jason Carter is a 75 y.o. with pertinent PMH of stage Ia cT1b N0 M0 non-small cell lung cancer of the left upper lobe of the lung status post stereotactic body radiation currently in remission and followed by [facility] oncology, allergic rhinitis, pre-diabetes, gout, retinal edema, cataract surgery who presented with shortness  of breath and tachycardia and admitted for further evaluation management of his tachyarrhythmia.  This is hospital day 5.  Atrial flutter with RVR Patient's heart rate has been challenging to control with oral medications due to soft BP. This morning, he developed hypotension with MAP ~55 after receiving metoprolol 100 mg and carvedilol 60 mg. BP has improved with 1 L LR bolus, though still soft, now 80s/60s. Consulted cardiology who agree with decreasing metoprolol and cardizem and given difficulty in controlling HR will arrange TEE-guided cardioversion on Monday if schedule allows. Patient would then follow-up with EP as an outpatient for possible atrial flutter ablation.  - cardiology consulted, appreciate recs  - decrease metoprolol and  cardizem as below, digoxin could be added if needed  - TEE-guided cardioversion tentatively scheduled for Monday (4/25)   - outpatient EP follow-up for possible atrial flutter ablation - s/p 1 L LR bolus, will continue to watch BP closely - metoprolol 50 mg twice daily, hold parameter SBP <100 - diltiazem 30 mg q6h, hold parameter SBP <100 - rivaroxaban (Xarelto) 20 mg daily - daily weights, strict I/O - Daily BMP, Mg  Acute on chronic cough Patient reports cough improved today with symptomatic treatment. - guaifenesin PRN  Anxiety, Insomnia Stable. Ramelteon 8 mg QHS PRN available. Consider outpatient referral to [facility] behavioral health counselor. Normocytic anemia Stable. History of alcohol use Will discontinue CIWA as patient has not had any signs or symptoms of alcohol withdrawal.  History of stage Ia NSCLC of the left upper lobe lung s/p radiation treatment Follow-up with oncology outpatient  Diet: Heart Healthy IVF: none VTE: Xarelto Code: Full   Please contact the on call pager after 5 pm and on weekends at [phone].  Alexandria Lodge, MD PGY-1 Internal Medicine Teaching Service Pager: [phone] 11/11/2020

## 2020-11-12 LAB — CBC WITH DIFFERENTIAL/PLATELET
Abs Immature Granulocytes: 0.01 10*3/uL (ref 0.00–0.07)
Basophils Absolute: 0 10*3/uL (ref 0.0–0.1)
Basophils Relative: 0 %
Eosinophils Absolute: 0.1 10*3/uL (ref 0.0–0.5)
Eosinophils Relative: 1 %
HCT: 36 % — ABNORMAL LOW (ref 39.0–52.0)
Hemoglobin: 11 g/dL — ABNORMAL LOW (ref 13.0–17.0)
Immature Granulocytes: 0 %
Lymphocytes Relative: 23 %
Lymphs Abs: 1.6 10*3/uL (ref 0.7–4.0)
MCH: 28.5 pg (ref 26.0–34.0)
MCHC: 30.6 g/dL (ref 30.0–36.0)
MCV: 93.3 fL (ref 80.0–100.0)
Monocytes Absolute: 1 10*3/uL (ref 0.1–1.0)
Monocytes Relative: 14 %
Neutro Abs: 4.1 10*3/uL (ref 1.7–7.7)
Neutrophils Relative %: 62 %
Platelets: 159 10*3/uL (ref 150–400)
RBC: 3.86 MIL/uL — ABNORMAL LOW (ref 4.22–5.81)
RDW: 14.1 % (ref 11.5–15.5)
WBC: 6.8 10*3/uL (ref 4.0–10.5)
nRBC: 0 % (ref 0.0–0.2)

## 2020-11-12 LAB — BASIC METABOLIC PANEL
Anion gap: 6 (ref 5–15)
BUN: 14 mg/dL (ref 8–23)
CO2: 32 mmol/L (ref 22–32)
Calcium: 8.6 mg/dL — ABNORMAL LOW (ref 8.9–10.3)
Chloride: 101 mmol/L (ref 98–111)
Creatinine, Ser: 0.75 mg/dL (ref 0.61–1.24)
GFR, Estimated: >60 mL/min (ref >60)
Glucose, Bld: 87 mg/dL (ref 70–99)
Potassium: 4.3 mmol/L (ref 3.5–5.1)
Sodium: 139 mmol/L (ref 135–145)

## 2020-11-12 MED ORDER — AMIODARONE LOAD VIA INFUSION
150.0000 mg | Freq: Once | INTRAVENOUS | Status: AC
  Administered 2020-11-12: 150 mg via INTRAVENOUS
  Filled 2020-11-12: qty 83.34

## 2020-11-12 MED ORDER — DILTIAZEM HCL 60 MG PO TABS
60.0000 mg | ORAL_TABLET | Freq: Four times a day (QID) | ORAL | Status: DC
  Filled 2020-11-12: qty 1

## 2020-11-12 MED ORDER — AMIODARONE HCL IN DEXTROSE 360-4.14 MG/200ML-% IV SOLN
30.0000 mg/h | INTRAVENOUS | Status: DC
  Administered 2020-11-13 – 2020-11-14 (×2): 30 mg/h via INTRAVENOUS
  Filled 2020-11-12 (×2): qty 200

## 2020-11-12 MED ORDER — AMIODARONE HCL IN DEXTROSE 360-4.14 MG/200ML-% IV SOLN
60.0000 mg/h | INTRAVENOUS | Status: DC
  Administered 2020-11-12 (×2): 60 mg/h via INTRAVENOUS
  Filled 2020-11-12 (×3): qty 200

## 2020-11-12 NOTE — Progress Notes (Signed)
   Received page that rates are still in the 130's and Metoprolol and Cardizem have not been able to be given due to systolic BP in the high 55'E. MAP in the 70's. Patient is feeling tired but otherwise asymptomatic. Discussed with Dr. Stanford Breed - will stop Metoprolol and Cardizem and start IV Amiodarone with bolus (bolus to be given very slowly). Plan is to stop Amiodarone after DCCV (want to avoid this long-term given patient's underlying lung disease).   Darreld Mclean, PA-C 11/12/2020 11:37 AM

## 2020-11-12 NOTE — Progress Notes (Signed)
Progress Note  Patient Name: Jason Carter Date of Encounter: 11/12/2020  [facility] Cardiologist: New; Dr Stanford Breed  Subjective   No CP or dyspnea  Inpatient Medications    Scheduled Meds: . diltiazem  30 mg Oral Q6H  . fluticasone  1 spray Each Nare Daily  . metoprolol tartrate  50 mg Oral BID  . multivitamin with minerals  1 tablet Oral Daily  . rivaroxaban  20 mg Oral Q supper   Continuous Infusions:  PRN Meds: acetaminophen, guaiFENesin, ramelteon   Vital Signs    Vitals:   11/12/20 0022 11/12/20 0430 11/12/20 0504 11/12/20 0642  BP: 120/83 93/71 98/76  99/78  Pulse:  (!) 129 (!) 128   Resp:  18 20   Temp:  98.1 F (36.7 C) 99 F (37.2 C)   TempSrc:  Oral    SpO2:  93% 99%   Weight:  70.8 kg    Height:        Intake/Output Summary (Last 24 hours) at 11/12/2020 0716 Last data filed at 11/12/2020 0648 Gross per 24 hour  Intake 480 ml  Output 1650 ml  Net -1170 ml   Last 3 Weights 11/12/2020 11/11/2020 11/10/2020  Weight (lbs) 156 lb 1.6 oz 156 lb 12.8 oz 157 lb 3.2 oz  Weight (kg) 70.806 kg 71.124 kg 71.305 kg      Telemetry    Atrial flutter- Personally Reviewed  Physical Exam   GEN: No acute distress.   Neck: No JVD Cardiac: irreegular Respiratory: Clear to auscultation bilaterally. GI: Soft, nontender, non-distended  MS: No edema Neuro:  Nonfocal  Psych: Normal affect   Labs    High Sensitivity Troponin:   Recent Labs  Lab 11/06/20 1242 11/06/20 1544 11/06/20 1712  TROPONINIHS 8 9 10       Chemistry Recent Labs  Lab 11/07/20 0056 11/08/20 0417 11/09/20 0341 11/11/20 0331 11/12/20 0138  NA 141   < > 140 137 139  K 4.4   < > 4.5 4.2 4.3  CL 104   < > 106 99 101  CO2 30   < > 29 32 32  GLUCOSE 96   < > 110* 92 87  BUN 8   < > 18 11 14   CREATININE 0.68   < > 0.69 0.69 0.75  CALCIUM 8.9   < > 9.0 8.7* 8.6*  PROT 6.5  --   --   --   --   ALBUMIN 3.4*  --   --   --   --   AST 18  --   --   --   --   ALT 20  --   --   --    --   ALKPHOS 52  --   --   --   --   BILITOT 1.0  --   --   --   --   GFRNONAA >60   < > >60 >60 >60  ANIONGAP 7   < > 5 6 6    < > = values in this interval not displayed.     Hematology Recent Labs  Lab 11/09/20 0341 11/11/20 0331 11/12/20 0138  WBC 8.3 6.3 6.8  RBC 3.84* 3.84* 3.86*  HGB 11.2* 10.9* 11.0*  HCT 35.9* 35.5* 36.0*  MCV 93.5 92.4 93.3  MCH 29.2 28.4 28.5  MCHC 31.2 30.7 30.6  RDW 14.2 14.2 14.1  PLT 178 146* 159    BNP Recent Labs  Lab 11/06/20 1540  BNP 248.6*  DDimer  Recent Labs  Lab 11/06/20 1242  DDIMER 1.50*     Patient Profile     75 y.o. male with past medical history of lung cancer status post radiation therapy and hypertension being evaluated for atrial flutter.  Echocardiogram this admission shows ejection fraction 50 to 55%, mild right ventricular enlargement/mild RV dysfunction, severe right atrial enlargement, mild mitral regurgitation, mild to moderate tricuspid regurgitation and mildly dilated aortic root.  Assessment & Plan    1. Typical atrial flutter-patient remains in atrial flutter.  Heart rate is elevated and blood pressure borderline.  We will continue metoprolol at 50 mg twice daily.  Increase Cardizem to 60 mg every 6 hours and hold for systolic less than 90.  As outlined previously I would like to avoid amiodarone given history of lung disease.  Rate will be difficult to control given this is atrial flutter.  He is tentatively scheduled for TEE guided cardioversion tomorrow if schedule allows.  Continue Xarelto.  Note LV function is normal on echocardiogram.  Once he is discharged we will refer to electrophysiology for consideration of ablation.   2. Right atrial/right ventricular enlargement-this was noted on transthoracic echocardiogram.  May be secondary to lung disease though pulmonary pressures were not significantly elevated.  Transesophageal echocardiogram will help exclude shunt such as atrial septal defect though this  was not evident on transthoracic.   3. History of lung cancer status post radiation   For questions or updates, please contact [place] Please consult [email or web address] for contact info under        Signed, Kirk Ruths, MD  11/12/2020, 7:16 AM

## 2020-11-12 NOTE — Progress Notes (Signed)
HD#6 Subjective:   Overnight, HR sustained 120s-130s.   During evaluation this morning, Jason Carter states he is doing "just fine." He denies chest pain, shortness of breath, dizziness, or lightheadedness. Notes his cough is persistent but better. He reports he was just seen by the cardiologist, and he is able to explain the plan and reason for TEE-guided cardioversion which is tentatively scheduled for tomorrow if the schedule allows.  Objective:   Vital signs in last 24 hours: Vitals:   11/12/20 0021 11/12/20 0022 11/12/20 0430 11/12/20 0504  BP: 120/83 120/83 93/71 98/76   Pulse: (!) 103  (!) 129 (!) 128  Resp: 20  18 20   Temp: 98.2 F (36.8 C)  98.1 F (36.7 C) 99 F (37.2 C)  TempSrc: Oral  Oral   SpO2: 93%  93% 99%  Weight:   70.8 kg   Height:       Physical Exam Constitutional: thin, elderly man lying in bed, in no acute distress HENT: normocephalic atraumatic, mucous membranes moist Cardiovascular: tachycardic, no m/r/g, no lower extremity edema Pulmonary/Chest: normal work of breathing on 1 L [place], scattered faint rhonchi, periodic productive cough  Filed Weights   11/10/20 0631 11/11/20 0353 11/12/20 0430  Weight: 71.3 kg 71.1 kg 70.8 kg    Intake/Output Summary (Last 24 hours) at 11/12/2020 0628 Last data filed at 11/11/2020 2215 Gross per 24 hour  Intake 480 ml  Output 1100 ml  Net -620 ml   Net IO Since Admission: -1,135.77 mL [11/12/20 0628]  Pertinent Labs: CBC Latest Ref Rng & Units 11/12/2020 11/11/2020 11/09/2020  WBC 4.0 - 10.5 K/uL 6.8 6.3 8.3  Hemoglobin 13.0 - 17.0 g/dL 11.0(L) 10.9(L) 11.2(L)  Hematocrit 39.0 - 52.0 % 36.0(L) 35.5(L) 35.9(L)  Platelets 150 - 400 K/uL 159 146(L) 178    CMP Latest Ref Rng & Units 11/12/2020 11/11/2020 11/09/2020  Glucose 70 - 99 mg/dL 87 92 110(H)  BUN 8 - 23 mg/dL 14 11 18   Creatinine 0.61 - 1.24 mg/dL 0.75 0.69 0.69  Sodium 135 - 145 mmol/L 139 137 140  Potassium 3.5 - 5.1 mmol/L 4.3 4.2 4.5  Chloride 98 - 111  mmol/L 101 99 106  CO2 22 - 32 mmol/L 32 32 29  Calcium 8.9 - 10.3 mg/dL 8.6(L) 8.7(L) 9.0  Total Protein 6.5 - 8.1 g/dL - - -  Total Bilirubin 0.3 - 1.2 mg/dL - - -  Alkaline Phos 38 - 126 U/L - - -  AST 15 - 41 U/L - - -  ALT 0 - 44 U/L - - -    Imaging: no new imaging  Assessment/Plan:   Active Problems:   Atrial flutter with rapid ventricular response (HCC)   Aortic atherosclerosis (HCC)   Emphysema lung (HCC)   Normocytic anemia   Patient Summary:  Jason Carter is a 75 y.o. 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  This is hospital day 6.  Atrial flutter with RVR Jason Carter remains in atrial flutter, and his heart rate is elevated with borderline BP. Cardiology is following, recommend continuing metoprolol at 50 mg twice daily but increase Cardizem to 60 mg every 6 hours with hold parameters for SBP <90.  Plan for TEE-guided cardioversion on Monday if schedule allows. Patient would then follow-up with EP as an outpatient for possible atrial flutter ablation.  - cardiology consulted, appreciate recs  - continue metoprolol 50 mg twice daily, hold parameter SBP <90  - increase cardizem to 60 mg q6h, hold parameter SBP <90  - TEE-guided cardioversion tentatively scheduled for tomorrow (4/25)   - outpatient EP follow-up for possible atrial flutter ablation - rivaroxaban (Xarelto) 20 mg daily - daily weights, strict I/O - Daily BMP, Mg  Acute on chronic cough Improving, continue guaifenesin PRN Anxiety, Insomnia Stable. Ramelteon 8 mg QHS PRN available. Consider outpatient referral to [facility] behavioral health counselor. Normocytic anemia  Stable. History of stage Ia NSCLC of the left upper lobe lung s/p radiation treatment Follow-up with oncology outpatient  Diet: Heart Healthy IVF: none VTE: Xarelto Code: Full   Please contact the on call pager after 5 pm and on weekends at [phone].  Alexandria Lodge, MD PGY-1 Internal Medicine Teaching Service Pager: [phone] 11/12/2020

## 2020-11-13 ENCOUNTER — Inpatient Hospital Stay (HOSPITAL_COMMUNITY): Payer: Medicare Other

## 2020-11-13 ENCOUNTER — Inpatient Hospital Stay (HOSPITAL_COMMUNITY): Payer: Medicare Other | Admitting: Certified Registered"

## 2020-11-13 ENCOUNTER — Encounter (HOSPITAL_COMMUNITY): Payer: Self-pay | Admitting: Internal Medicine

## 2020-11-13 ENCOUNTER — Encounter (HOSPITAL_COMMUNITY)
Admission: EM | Disposition: A | Discharge status: Home Health | Payer: Self-pay | Source: Home / Self Care | Attending: Internal Medicine

## 2020-11-13 ENCOUNTER — Other Ambulatory Visit: Payer: Self-pay

## 2020-11-13 DIAGNOSIS — I361 Nonrheumatic tricuspid (valve) insufficiency: Secondary | ICD-10-CM

## 2020-11-13 DIAGNOSIS — I4892 Unspecified atrial flutter: Secondary | ICD-10-CM

## 2020-11-13 DIAGNOSIS — I340 Nonrheumatic mitral (valve) insufficiency: Secondary | ICD-10-CM

## 2020-11-13 HISTORY — PX: CARDIOVERSION: SHX1299

## 2020-11-13 HISTORY — PX: TEE WITHOUT CARDIOVERSION: SHX5443

## 2020-11-13 LAB — BASIC METABOLIC PANEL
Anion gap: 6 (ref 5–15)
BUN: 11 mg/dL (ref 8–23)
CO2: 33 mmol/L — ABNORMAL HIGH (ref 22–32)
Calcium: 8.6 mg/dL — ABNORMAL LOW (ref 8.9–10.3)
Chloride: 100 mmol/L (ref 98–111)
Creatinine, Ser: 0.59 mg/dL — ABNORMAL LOW (ref 0.61–1.24)
GFR, Estimated: >60 mL/min (ref >60)
Glucose, Bld: 81 mg/dL (ref 70–99)
Potassium: 4.1 mmol/L (ref 3.5–5.1)
Sodium: 139 mmol/L (ref 135–145)

## 2020-11-13 LAB — CBC WITH DIFFERENTIAL/PLATELET
Abs Immature Granulocytes: 0.01 10*3/uL (ref 0.00–0.07)
Basophils Absolute: 0 10*3/uL (ref 0.0–0.1)
Basophils Relative: 1 %
Eosinophils Absolute: 0.1 10*3/uL (ref 0.0–0.5)
Eosinophils Relative: 2 %
HCT: 36.4 % — ABNORMAL LOW (ref 39.0–52.0)
Hemoglobin: 10.9 g/dL — ABNORMAL LOW (ref 13.0–17.0)
Immature Granulocytes: 0 %
Lymphocytes Relative: 19 %
Lymphs Abs: 1.2 10*3/uL (ref 0.7–4.0)
MCH: 28 pg (ref 26.0–34.0)
MCHC: 29.9 g/dL — ABNORMAL LOW (ref 30.0–36.0)
MCV: 93.6 fL (ref 80.0–100.0)
Monocytes Absolute: 0.7 10*3/uL (ref 0.1–1.0)
Monocytes Relative: 12 %
Neutro Abs: 4.1 10*3/uL (ref 1.7–7.7)
Neutrophils Relative %: 66 %
Platelets: 127 10*3/uL — ABNORMAL LOW (ref 150–400)
RBC: 3.89 MIL/uL — ABNORMAL LOW (ref 4.22–5.81)
RDW: 14.1 % (ref 11.5–15.5)
WBC: 6.2 10*3/uL (ref 4.0–10.5)
nRBC: 0 % (ref 0.0–0.2)

## 2020-11-13 LAB — MAGNESIUM: Magnesium: 1.8 mg/dL (ref 1.7–2.4)

## 2020-11-13 SURGERY — ECHOCARDIOGRAM, TRANSESOPHAGEAL
Anesthesia: Monitor Anesthesia Care

## 2020-11-13 MED ORDER — PROPOFOL 500 MG/50ML IV EMUL
INTRAVENOUS | Status: DC | PRN
  Administered 2020-11-13: 125 ug/kg/min via INTRAVENOUS

## 2020-11-13 MED ORDER — SODIUM CHLORIDE 0.9 % IV SOLN
INTRAVENOUS | Status: AC | PRN
  Administered 2020-11-13: 500 mL via INTRAMUSCULAR

## 2020-11-13 MED ORDER — PROPOFOL 10 MG/ML IV BOLUS
INTRAVENOUS | Status: DC | PRN
  Administered 2020-11-13: 20 mg via INTRAVENOUS

## 2020-11-13 NOTE — CV Procedure (Signed)
    PROCEDURE NOTE:  Procedure:  Transesophageal echocardiogram Operator:  Fransico Him, MD Indications:  Atrial fultter with RVR Complications: None  During this procedure the patient is administered a total of Propofol 119 mg to achieve and maintain moderate conscious sedation.  The patient's heart rate, blood pressure, and oxygen saturation are monitored continuously during the procedure by anesthesia.  Results: Normal LV size and low normal LV function with EF 50-55% Mildly dilated RV with mildly reduced RVF Severely dilated RA with spontaneous echo contrast. Moderately dilated LA with spontaneous echo contrast.  Normal LA appendage with no evidence of thrombus.  Normal TV with mild to moderate TR Normal PV Normal MV with mild MR Normal trileaflet AV Hypermobile interatrial septum with bulging of septum towards the LA c/w increased RA pressure.  No evidence of shunt by colorflow dopper  Mild atheromatous plaque of the thoracic and ascending aorta.  The patient tolerated the procedure well went on to DCCV    Procedure: Electrical Cardioversion Indications:  Atrial Flutter  Time Out: Verified patient identification, verified procedure,medications/allergies/relevent history reviewed, required imaging and test results available.  Performed  Procedure Details  The patient was NPO after midnight. Anesthesia was administered at the beside  by Dr.Howze.  Cardioversion was done with synchronized biphasic defibrillation with AP pads with 150watts.  The patient converted to normal sinus rhythm. The patient tolerated the procedure well   IMPRESSION:  Successful cardioversion of atrial flutter    Jason Carter 11/13/2020, 9:48 AM

## 2020-11-13 NOTE — Transfer of Care (Signed)
Immediate Anesthesia Transfer of Care Note  Patient: Jason Carter  Procedure(s) Performed: TRANSESOPHAGEAL ECHOCARDIOGRAM (TEE) (N/A ) CARDIOVERSION (N/A )  Patient Location: Endoscopy Unit  Anesthesia Type:MAC  Level of Consciousness: awake, alert  and oriented  Airway & Oxygen Therapy: Patient Spontanous Breathing and Patient connected to nasal cannula oxygen  Post-op Assessment: Report given to RN, Post -op Vital signs reviewed and stable and Patient moving all extremities  Post vital signs: Reviewed and stable  Last Vitals:  Vitals Value Taken Time  BP 94/53 11/13/20 0952  Temp 36.7 C 11/13/20 0952  Pulse 86 11/13/20 0956  Resp 15 11/13/20 0956  SpO2 96 % 11/13/20 0956  Vitals shown include unvalidated device data.  Last Pain:  Vitals:   11/13/20 0952  TempSrc: Axillary  PainSc: Asleep      Patients Stated Pain Goal: 0 (24/81/85 9093)  Complications: No complications documented.

## 2020-11-13 NOTE — Progress Notes (Addendum)
Heart Failure Nurse Navigator Progress Note  Attempted to reschedule HV TOC appt. Pt currently in endo for DCCV. Will plan to adjust appt with pt approval later today.   1606: successful DCCV today. Pt remains on amio gtt at this time. Will re-evaluate pt tomorrow to determine timing of HV TOC follow up.     Pricilla Holm, RN, BSN Heart Failure Nurse Navigator [phone]

## 2020-11-13 NOTE — Anesthesia Preprocedure Evaluation (Addendum)
Anesthesia Evaluation  Patient identified by MRN, date of birth, ID band Patient awake    Reviewed: Allergy & Precautions, NPO status , Patient's Chart, lab work & pertinent test results  History of Anesthesia Complications Negative for: history of anesthetic complications  Airway Mallampati: II  TM Distance: >3 FB Neck ROM: Full    Dental  (+) Edentulous Upper   Pulmonary COPD, former smoker,    Pulmonary exam normal        Cardiovascular hypertension, Normal cardiovascular exam+ dysrhythmias Atrial Fibrillation   TTE 11/08/20: EF 20-23%, RV systolic function mildly reduced, mild RVE, severe RAE, mild MR, mild to moderate TR, mild dilatation of aortic root, measuring 63m   Neuro/Psych negative neurological ROS  negative psych ROS   GI/Hepatic negative GI ROS, Neg liver ROS,   Endo/Other  negative endocrine ROS  Renal/GU negative Renal ROS  negative genitourinary   Musculoskeletal negative musculoskeletal ROS (+)   Abdominal   Peds  Hematology  (+) anemia ,   Anesthesia Other Findings Day of surgery medications reviewed with patient.  Reproductive/Obstetrics negative OB ROS                            Anesthesia Physical Anesthesia Plan  ASA: III  Anesthesia Plan: MAC   Post-op Pain Management:    Induction:   PONV Risk Score and Plan: Treatment may vary due to age or medical condition and Propofol infusion  Airway Management Planned: Natural Airway and Nasal Cannula  Additional Equipment:   Intra-op Plan:   Post-operative Plan:   Informed Consent: I have reviewed the patients History and Physical, chart, labs and discussed the procedure including the risks, benefits and alternatives for the proposed anesthesia with the patient or authorized representative who has indicated his/her understanding and acceptance.       Plan Discussed with: CRNA  Anesthesia Plan Comments:          Anesthesia Quick Evaluation

## 2020-11-13 NOTE — Progress Notes (Signed)
Progress Note  Patient Name: Jason Carter Date of Encounter: 11/13/2020  [facility] Cardiologist: New; Dr Stanford Breed  Subjective   No complaints NPO for TEE/DCC today rates still high   Inpatient Medications    Scheduled Meds: . fluticasone  1 spray Each Nare Daily  . multivitamin with minerals  1 tablet Oral Daily  . rivaroxaban  20 mg Oral Q supper   Continuous Infusions: . amiodarone 30 mg/hr (11/12/20 1900)   PRN Meds: acetaminophen, guaiFENesin, ramelteon   Vital Signs    Vitals:   11/12/20 1654 11/12/20 1951 11/13/20 0051 11/13/20 0754  BP: 116/75 112/79 131/84 114/87  Pulse: (!) 125 (!) 127 (!) 124 (!) 126  Resp: 18 20 20 20   Temp:  98.4 F (36.9 C) 98.5 F (36.9 C) 98.9 F (37.2 C)  TempSrc: Oral Oral Oral Oral  SpO2: 94% 95% 93% 96%  Weight:      Height:        Intake/Output Summary (Last 24 hours) at 11/13/2020 0831 Last data filed at 11/13/2020 0545 Gross per 24 hour  Intake 179 ml  Output 500 ml  Net -321 ml   Last 3 Weights 11/12/2020 11/11/2020 11/10/2020  Weight (lbs) 156 lb 1.6 oz 156 lb 12.8 oz 157 lb 3.2 oz  Weight (kg) 70.806 kg 71.124 kg 71.305 kg      Telemetry    Atrial flutter- rates 115-120 bpm 11/13/2020   Physical Exam   Affect appropriate Healthy:  appears stated age 75: normal Neck supple with no adenopathy JVP normal no bruits no thyromegaly Lungs clear with no wheezing and good diaphragmatic motion Heart:  S1/S2 no murmur, no rub, gallop or click PMI normal Abdomen: benighn, BS positve, no tenderness, no AAA no bruit.  No HSM or HJR Distal pulses intact with no bruits No edema Neuro non-focal Skin warm and dry No muscular weakness   Labs    High Sensitivity Troponin:   Recent Labs  Lab 11/06/20 1242 11/06/20 1544 11/06/20 1712  TROPONINIHS 8 9 10       Chemistry Recent Labs  Lab 11/07/20 0056 11/08/20 0417 11/11/20 0331 11/12/20 0138 11/13/20 0250  NA 141   < > 137 139 139  K 4.4   < > 4.2  4.3 4.1  CL 104   < > 99 101 100  CO2 30   < > 32 32 33*  GLUCOSE 96   < > 92 87 81  BUN 8   < > 11 14 11   CREATININE 0.68   < > 0.69 0.75 0.59*  CALCIUM 8.9   < > 8.7* 8.6* 8.6*  PROT 6.5  --   --   --   --   ALBUMIN 3.4*  --   --   --   --   AST 18  --   --   --   --   ALT 20  --   --   --   --   ALKPHOS 52  --   --   --   --   BILITOT 1.0  --   --   --   --   GFRNONAA >60   < > >60 >60 >60  ANIONGAP 7   < > 6 6 6    < > = values in this interval not displayed.     Hematology Recent Labs  Lab 11/11/20 0331 11/12/20 0138 11/13/20 0250  WBC 6.3 6.8 6.2  RBC 3.84* 3.86* 3.89*  HGB 10.9* 11.0*  10.9*  HCT 35.5* 36.0* 36.4*  MCV 92.4 93.3 93.6  MCH 28.4 28.5 28.0  MCHC 30.7 30.6 29.9*  RDW 14.2 14.1 14.1  PLT 146* 159 127*    BNP Recent Labs  Lab 11/06/20 1540  BNP 248.6*     DDimer  Recent Labs  Lab 11/06/20 1242  DDIMER 1.50*     Patient Profile     75 y.o. lung cancer Rx XRT HTN and flutter. EF 50-55% mild MR severe RAE for TEE/DCC 11/13/2020   Assessment & Plan    1. Typical atrial flutter-On xarelto for TEE/DCC BP soft on iv amiodarone will not go home on it due to lung disease consider outpatient evaluation for ablation  2. History of lung cancer status post radiation CT this admission no PE and stable LUL apical nodule    For questions or updates, please contact [place] Please consult [email or web address] for contact info under        Signed, Jenkins Rouge, MD  11/13/2020, 8:31 AM

## 2020-11-13 NOTE — Interval H&P Note (Signed)
History and Physical Interval Note:  11/13/2020 9:13 AM  Jason Carter  has presented today for surgery, with the diagnosis of afib.  The various methods of treatment have been discussed with the patient and family. After consideration of risks, benefits and other options for treatment, the patient has consented to  Procedure(s): TRANSESOPHAGEAL ECHOCARDIOGRAM (TEE) (N/A) CARDIOVERSION (N/A) as a surgical intervention.  The patient's history has been reviewed, patient examined, no change in status, stable for surgery.  I have reviewed the patient's chart and labs.  Questions were answered to the patient's satisfaction.     Fransico Him

## 2020-11-13 NOTE — Evaluation (Signed)
Physical Therapy Evaluation Patient Details Name: Jason Carter MRN: [id number] DOB: [birth year] Today's Date: 11/13/2020   History of Present Illness  Pt is a 75 y.o. M who presents with SOB and tachycardia. Admitted for management of tachyarrhythmia. S/p TEE guided cardioversion 4/25. Significant PMH: stage I non small cell lung CA, gout, retinal edema, cataract surgery.  Clinical Impression  PTA, pt lives alone and is independent. Pt presents with decreased cardiopulmonary endurance. Ambulating x 120 feet with no assistive device at a supervision level. Desaturation to 84% on RA (pt asymptomatic); rebounded to 97% on 2L O2 after 1-2 minutes and cues for pursed lip breathing. Do not anticipate need for PT follow up. Will continue to follow acutely to promote mobility as tolerated.     Follow Up Recommendations No PT follow up    Equipment Recommendations  None recommended by PT    Recommendations for Other Services       Precautions / Restrictions Precautions Precautions: Other (comment) Precaution Comments: watch O2 Restrictions Weight Bearing Restrictions: No      Mobility  Bed Mobility Overal bed mobility: Independent                  Transfers Overall transfer level: Independent Equipment used: None                Ambulation/Gait Ambulation/Gait assistance: Supervision Gait Distance (Feet): 120 Feet Assistive device: None Gait Pattern/deviations: Step-through pattern;Decreased stride length     General Gait Details: Steady pace, cues for activity pacing and pursed lip breathing  Stairs            Wheelchair Mobility    Modified Rankin (Stroke Patients Only)       Balance Overall balance assessment: Mild deficits observed, not formally tested                                           Pertinent Vitals/Pain Pain Assessment: No/denies pain    Home Living Family/patient expects to be discharged to:: Private  residence Living Arrangements: Alone Available Help at Discharge: Family (son, granddaughters) Type of Home: House Home Access: Stairs to enter   Technical brewer of Steps: 2 Home Layout: One level        Prior Function Level of Independence: Independent         Comments: Has 3 stray cats, like to feed birds in his yard     Hand Dominance        Extremity/Trunk Assessment   Upper Extremity Assessment Upper Extremity Assessment: Overall WFL for tasks assessed    Lower Extremity Assessment Lower Extremity Assessment: Overall WFL for tasks assessed    Cervical / Trunk Assessment Cervical / Trunk Assessment: Normal  Communication   Communication: No difficulties  Cognition Arousal/Alertness: Awake/alert Behavior During Therapy: WFL for tasks assessed/performed Overall Cognitive Status: Within Functional Limits for tasks assessed                                        General Comments      Exercises     Assessment/Plan    PT Assessment Patient needs continued PT services  PT Problem List Decreased activity tolerance;Cardiopulmonary status limiting activity       PT Treatment Interventions Gait training;Stair training;Functional mobility training;Therapeutic activities;Therapeutic exercise;Balance training;Patient/family education  PT Goals (Current goals can be found in the Care Plan section)  Acute Rehab PT Goals Patient Stated Goal: return home PT Goal Formulation: With patient Time For Goal Achievement: 11/27/20 Potential to Achieve Goals: Good    Frequency Min 3X/week   Barriers to discharge        Co-evaluation               AM-PAC PT "6 Clicks" Mobility  Outcome Measure Help needed turning from your back to your side while in a flat bed without using bedrails?: None Help needed moving from lying on your back to sitting on the side of a flat bed without using bedrails?: None Help needed moving to and from a  bed to a chair (including a wheelchair)?: None Help needed standing up from a chair using your arms (e.g., wheelchair or bedside chair)?: None Help needed to walk in hospital room?: A Little   6 Click Score: 19    End of Session   Activity Tolerance: Patient tolerated treatment well Patient left: in chair;with call bell/phone within reach Nurse Communication: Mobility status PT Visit Diagnosis: Difficulty in walking, not elsewhere classified (R26.2)    Time: 0998-3382 PT Time Calculation (min) (ACUTE ONLY): 23 min   Charges:   PT Evaluation $PT Eval Moderate Complexity: 1 Mod PT Treatments $Therapeutic Activity: 8-22 mins $Neuromuscular Re-education: 8-22 mins        Jason Carter, PT, DPT Acute Rehabilitation Services Pager [phone] Office [phone]   Jason Carter 11/13/2020, 5:17 PM

## 2020-11-13 NOTE — Anesthesia Procedure Notes (Signed)
Procedure Name: MAC Date/Time: 11/13/2020 9:37 AM Performed by: Amadeo Garnet, CRNA Pre-anesthesia Checklist: Patient identified, Emergency Drugs available, Suction available and Patient being monitored Patient Re-evaluated:Patient Re-evaluated prior to induction Oxygen Delivery Method: Nasal cannula Preoxygenation: Pre-oxygenation with 100% oxygen Induction Type: IV induction Placement Confirmation: positive ETCO2 Dental Injury: Teeth and Oropharynx as per pre-operative assessment

## 2020-11-13 NOTE — Plan of Care (Signed)

## 2020-11-13 NOTE — Anesthesia Postprocedure Evaluation (Signed)
Anesthesia Post Note  Patient: Jason Carter  Procedure(s) Performed: TRANSESOPHAGEAL ECHOCARDIOGRAM (TEE) (N/A ) CARDIOVERSION (N/A )     Patient location during evaluation: PACU Anesthesia Type: MAC Level of consciousness: awake and alert and oriented Pain management: pain level controlled Vital Signs Assessment: post-procedure vital signs reviewed and stable Respiratory status: spontaneous breathing, nonlabored ventilation and respiratory function stable Cardiovascular status: blood pressure returned to baseline Postop Assessment: no apparent nausea or vomiting Anesthetic complications: no   No complications documented.  Last Vitals:  Vitals:   11/13/20 1015 11/13/20 1031  BP: (!) 99/54 111/72  Pulse: 84 88  Resp: 19 18  Temp:  37.2 C  SpO2: 98% 96%    Last Pain:  Vitals:   11/13/20 1031  TempSrc: Oral  PainSc: 0-No pain                 Brennan Bailey

## 2020-11-13 NOTE — Progress Notes (Signed)
  Echocardiogram Echocardiogram Transesophageal has been performed.  Jason Carter 11/13/2020, 10:04 AM
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
## 2020-11-13 NOTE — Progress Notes (Signed)
HD#7 Subjective:   Overnight, HR sustained 120s.   During evaluation this morning, Jason Carter states he is feeling "good." Denies chest pain or shortness of breath. States he slept well last night. Feels ready for his procedure this morning.  Objective:   Vital signs in last 24 hours: Vitals:   11/12/20 1600 11/12/20 1654 11/12/20 1951 11/13/20 0051  BP: 106/70 116/75 112/79 131/84  Pulse:  (!) 125 (!) 127 (!) 124  Resp:  18 20 20   Temp:   98.4 F (36.9 C) 98.5 F (36.9 C)  TempSrc:  Oral Oral Oral  SpO2:  94% 95% 93%  Weight:      Height:       Physical Exam Constitutional: thin, elderly man lying in bed, in no acute distress HENT: normocephalic atraumatic, mucous membranes moist Cardiovascular: tachycardic, no m/r/g, no lower extremity edema Pulmonary/Chest: normal work of breathing on 2 L [place], scattered faint rhonchi, periodic productive cough  Filed Weights   11/10/20 0631 11/11/20 0353 11/12/20 0430  Weight: 71.3 kg 71.1 kg 70.8 kg    Intake/Output Summary (Last 24 hours) at 11/13/2020 0629 Last data filed at 11/13/2020 0545 Gross per 24 hour  Intake 179 ml  Output 1050 ml  Net -871 ml   Net IO Since Admission: -2,006.77 mL [11/13/20 0629]  Pertinent Labs: CBC Latest Ref Rng & Units 11/13/2020 11/12/2020 11/11/2020  WBC 4.0 - 10.5 K/uL 6.2 6.8 6.3  Hemoglobin 13.0 - 17.0 g/dL 10.9(L) 11.0(L) 10.9(L)  Hematocrit 39.0 - 52.0 % 36.4(L) 36.0(L) 35.5(L)  Platelets 150 - 400 K/uL 127(L) 159 146(L)    CMP Latest Ref Rng & Units 11/13/2020 11/12/2020 11/11/2020  Glucose 70 - 99 mg/dL 81 87 92  BUN 8 - 23 mg/dL 11 14 11   Creatinine 0.61 - 1.24 mg/dL 0.59(L) 0.75 0.69  Sodium 135 - 145 mmol/L 139 139 137  Potassium 3.5 - 5.1 mmol/L 4.1 4.3 4.2  Chloride 98 - 111 mmol/L 100 101 99  CO2 22 - 32 mmol/L 33(H) 32 32  Calcium 8.9 - 10.3 mg/dL 8.6(L) 8.6(L) 8.7(L)  Total Protein 6.5 - 8.1 g/dL - - -  Total Bilirubin 0.3 - 1.2 mg/dL - - -  Alkaline Phos 38 - 126 U/L - - -   AST 15 - 41 U/L - - -  ALT 0 - 44 U/L - - -    Imaging: no new imaging  Assessment/Plan:   Active Problems:   Atrial flutter with rapid ventricular response (HCC)   Aortic atherosclerosis (HCC)   Emphysema lung (HCC)   Normocytic anemia   Patient Summary:  Jason Carter is a 75 y.o. 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  This is hospital day 7.  Atrial flutter with RVR Mr. Kauer remains in atrial flutter, and his heart rate is elevated, though BP improved after initiation of amiodarone by cardiology yesterday and discontinuation of metoprolol and cardizem. Plan for TEE-guided cardioversion later this morning. Patient would then follow-up with EP as an outpatient for possible atrial flutter ablation.  - cardiology consulted, appreciate recs  - TEE-guided cardioversion this morning  - amiodarone per cardiology, plan to stop after DCCV given patient's underlying lung  disease  - metoprolol & cardizem discontinued 4/24  - outpatient EP follow-up for possible atrial flutter ablation - rivaroxaban (Xarelto) 20 mg daily - daily weights, strict I/O - Daily BMP, Mg  Acute on chronic cough Stable. Continue guaifenesin PRN Anxiety, Insomnia Stable. Ramelteon 8 mg QHS PRN available. Consider outpatient referral to [facility] behavioral health counselor. Normocytic anemia Stable. History of stage Ia NSCLC of the left upper lobe lung s/p radiation treatment Follow-up with oncology outpatient  Diet: NPO since midnight IVF: none VTE: Xarelto Code: Full   Please contact the on call pager after 5 pm and on weekends at [phone].  Alexandria Lodge,  MD PGY-1 Internal Medicine Teaching Service Pager: [phone] 11/13/2020

## 2020-11-13 NOTE — Progress Notes (Signed)
SATURATION QUALIFICATIONS: (This note is used to comply with regulatory documentation for home oxygen)  Patient Saturations on Room Air at Rest = 92%  Patient Saturations on Room Air while Ambulating = 84%  Patient Saturations on 2 Liters of oxygen while Ambulating = 97%  Please briefly explain why patient needs home oxygen: To maintain oxygen saturations > 90% when ambulating.  Wyona Almas, PT, DPT Acute Rehabilitation Services Pager [phone] Office [phone]

## 2020-11-14 ENCOUNTER — Other Ambulatory Visit (HOSPITAL_COMMUNITY): Payer: Self-pay

## 2020-11-14 ENCOUNTER — Encounter (HOSPITAL_COMMUNITY): Payer: Self-pay | Admitting: Cardiology

## 2020-11-14 DIAGNOSIS — I700 Atherosclerosis of aorta: Secondary | ICD-10-CM

## 2020-11-14 DIAGNOSIS — J439 Emphysema, unspecified: Secondary | ICD-10-CM

## 2020-11-14 DIAGNOSIS — D649 Anemia, unspecified: Secondary | ICD-10-CM

## 2020-11-14 LAB — CBC WITH DIFFERENTIAL/PLATELET
Abs Immature Granulocytes: 0.01 10*3/uL (ref 0.00–0.07)
Basophils Absolute: 0 10*3/uL (ref 0.0–0.1)
Basophils Relative: 0 %
Eosinophils Absolute: 0.2 10*3/uL (ref 0.0–0.5)
Eosinophils Relative: 4 %
HCT: 34.5 % — ABNORMAL LOW (ref 39.0–52.0)
Hemoglobin: 10.4 g/dL — ABNORMAL LOW (ref 13.0–17.0)
Immature Granulocytes: 0 %
Lymphocytes Relative: 28 %
Lymphs Abs: 1.3 10*3/uL (ref 0.7–4.0)
MCH: 28.2 pg (ref 26.0–34.0)
MCHC: 30.1 g/dL (ref 30.0–36.0)
MCV: 93.5 fL (ref 80.0–100.0)
Monocytes Absolute: 0.5 10*3/uL (ref 0.1–1.0)
Monocytes Relative: 10 %
Neutro Abs: 2.8 10*3/uL (ref 1.7–7.7)
Neutrophils Relative %: 58 %
Platelets: 123 10*3/uL — ABNORMAL LOW (ref 150–400)
RBC: 3.69 MIL/uL — ABNORMAL LOW (ref 4.22–5.81)
RDW: 13.9 % (ref 11.5–15.5)
WBC: 4.8 10*3/uL (ref 4.0–10.5)
nRBC: 0 % (ref 0.0–0.2)

## 2020-11-14 LAB — BASIC METABOLIC PANEL
Anion gap: 8 (ref 5–15)
BUN: 13 mg/dL (ref 8–23)
CO2: 33 mmol/L — ABNORMAL HIGH (ref 22–32)
Calcium: 8.5 mg/dL — ABNORMAL LOW (ref 8.9–10.3)
Chloride: 96 mmol/L — ABNORMAL LOW (ref 98–111)
Creatinine, Ser: 0.6 mg/dL — ABNORMAL LOW (ref 0.61–1.24)
GFR, Estimated: >60 mL/min (ref >60)
Glucose, Bld: 81 mg/dL (ref 70–99)
Potassium: 3.9 mmol/L (ref 3.5–5.1)
Sodium: 137 mmol/L (ref 135–145)

## 2020-11-14 LAB — MAGNESIUM: Magnesium: 1.9 mg/dL (ref 1.7–2.4)

## 2020-11-14 MED ORDER — RIVAROXABAN 20 MG PO TABS
20.0000 mg | ORAL_TABLET | Freq: Every day | ORAL | 0 refills | Status: DC
  Filled 2020-11-14: qty 30, 30d supply, fill #0

## 2020-11-14 NOTE — Progress Notes (Addendum)
Progress Note  Patient Name: Jason Carter Date of Encounter: 11/14/2020  [facility] Cardiologist: New; Dr Stanford Breed  Subjective   Tolerated procedure well, no CP or SOB, maint SR  Inpatient Medications    Scheduled Meds:  fluticasone  1 spray Each Nare Daily   multivitamin with minerals  1 tablet Oral Daily   rivaroxaban  20 mg Oral Q supper   Continuous Infusions:  amiodarone 30 mg/hr (11/14/20 0444)   PRN Meds: acetaminophen, guaiFENesin, ramelteon   Vital Signs    Vitals:   11/13/20 1840 11/13/20 2013 11/14/20 0433 11/14/20 0751  BP: 127/70 114/68 109/62 108/70  Pulse: 97 93 80 80  Resp:  20 17 18   Temp:  98.3 F (36.8 C) 97.7 F (36.5 C) 98 F (36.7 C)  TempSrc:  Oral Oral Oral  SpO2: 97% 99% 95% 96%  Weight:   70.2 kg   Height:        Intake/Output Summary (Last 24 hours) at 11/14/2020 0836 Last data filed at 11/14/2020 0800 Gross per 24 hour  Intake 1936.42 ml  Output 750 ml  Net 1186.42 ml   Last 3 Weights 11/14/2020 11/13/2020 11/12/2020  Weight (lbs) 154 lb 12.8 oz 156 lb 1.4 oz 156 lb 1.6 oz  Weight (kg) 70.217 kg 70.8 kg 70.806 kg      Telemetry    A flutter >> SR after TEE/DCCV - 11/14/2020   Physical Exam   GEN: No acute distress.   Neck: mild JVD Cardiac: RRR, no murmur, no rubs, or gallops.  Respiratory: diminished to auscultation bilaterally with some rales in the bases. Coarse cough GI: Soft, nontender, non-distended  MS: No edema; No deformity. Neuro:  Nonfocal  Psych: Normal affect  Skin: no lesions from DCCV   Labs    High Sensitivity Troponin:   Recent Labs  Lab 11/06/20 1242 11/06/20 1544 11/06/20 1712  TROPONINIHS 8 9 10       Chemistry Recent Labs  Lab 11/12/20 0138 11/13/20 0250 11/14/20 0245  NA 139 139 137  K 4.3 4.1 3.9  CL 101 100 96*  CO2 32 33* 33*  GLUCOSE 87 81 81  BUN 14 11 13   CREATININE 0.75 0.59* 0.60*  CALCIUM 8.6* 8.6* 8.5*  GFRNONAA >60 >60 >60  ANIONGAP 6 6 8       Hematology Recent Labs  Lab 11/12/20 0138 11/13/20 0250 11/14/20 0245  WBC 6.8 6.2 4.8  RBC 3.86* 3.89* 3.69*  HGB 11.0* 10.9* 10.4*  HCT 36.0* 36.4* 34.5*  MCV 93.3 93.6 93.5  MCH 28.5 28.0 28.2  MCHC 30.6 29.9* 30.1  RDW 14.1 14.1 13.9  PLT 159 127* 123*   Lab Results  Component Value Date   TSH 2.687 11/06/2020   Lab Results  Component Value Date   HGBA1C 5.6 04/06/2020   Lab Results  Component Value Date   CHOL 157 04/06/2020   HDL 51 04/06/2020   LDLCALC 85 04/06/2020   TRIG 115 04/06/2020   CHOLHDL 3.1 04/06/2020    BNP No results for input(s): BNP, PROBNP in the last 168 hours.   DDimer  No results for input(s): DDIMER in the last 168 hours.   Patient Profile     75 y.o. lung cancer Rx XRT HTN and flutter. EF 50-55% mild MR severe RAE for TEE/DCC 11/14/2020   Assessment & Plan    Typical atrial flutter - S/P TEE/DCCV >> SR - EF nl but R atrium severely dilated on echo, RV function mildly reduced -  Xarelto for anticoag - no BB 2nd lung dz and borderline BP - pt will ck HR w/ pulse ox and BP cuff, report if abnl - consider ablation if has recurrence  History of lung cancer status post radiation  - LUL nodule stable, no PE on CT  3. Aortic dilatation - Ao root 41 mm, follow   Plan: continue IV amio till MD sees, then d/c Keep appt 04/27 w/ HrtVasc F/u with regular cardiology as well, will arrange MD advise if any further cardiac workup indicated  For questions or updates, please contact [place] Please consult [email or web address] for contact info under        Signed, Rosaria Ferries, PA-C  11/14/2020, 8:36 AM    Patient examined chart reviewed discussed care with patient and PA Maintaining NSR post TEE/DCC 11/13/20 Amiodarone d/c this am no oral Rx due to lung disease cancer with XRT and likely already low DLCO Continue xarelto  ok to d/c home outpatient f/u arranged with Dr Stanford Breed  Jenkins Rouge MD [facility]

## 2020-11-14 NOTE — Evaluation (Signed)
Occupational Therapy Evaluation Patient Details Name: Jason Carter MRN: [id number] DOB: 1945/12/01 Today's Date: 11/14/2020    History of Present Illness Pt is a 75 y.o. male admitted 11/06/20 with SOB, tachycardia. Admitted for management of tachyarrhythmia. S/p TEE guided cardioversion 4/25. PMH includes stage I non small cell lung CA, gout, retinal edema, cataract surgery.   Clinical Impression   Pt admitted to ED due to concerns listed above. PTA pt reported living alone, independent in all ADL's and IADL's, still completing his and his neighbors yard work, and working on Gaff[employer] when able. During the evaluation, pt was able to complete grooming, dressing, and toileting with independence and functional mobility using no DME. Pt spO2% were monitored on RA, with ambulation pt dropped to 78%, at rest pt remained around 89-94%. Pt does not need OT services at this time and will be discharged from acute OT.    Follow Up Recommendations  No OT follow up    Equipment Recommendations  Tub/shower bench    Recommendations for Other Services       Precautions / Restrictions Precautions Precautions: Fall;Other (comment) Precaution Comments: Watch SpO2 Restrictions Weight Bearing Restrictions: No      Mobility Bed Mobility Overal bed mobility: Independent             General bed mobility comments: Pt able to manage flat bed with no difficulty    Transfers Overall transfer level: Independent Equipment used: None             General transfer comment: Pt complete transfers on and off recliner, bed, and toilet in bathroom with no concerns    Balance Overall balance assessment: No apparent balance deficits (not formally assessed)   Sitting balance-Leahy Scale: Good       Standing balance-Leahy Scale: Good                             ADL either performed or assessed with clinical judgement   ADL Overall ADL's : Modified independent                                        General ADL Comments: Pt completed toileting, grooming standing at the sink, and functional mobility with no DME. Pt required no assistance and did not experience and LOB     Vision Baseline Vision/History: No visual deficits Patient Visual Report: No change from baseline Vision Assessment?: No apparent visual deficits     Perception Perception Perception Tested?: No   Praxis Praxis Praxis tested?: Not tested    Pertinent Vitals/Pain Pain Assessment: No/denies pain     Hand Dominance Right   Extremity/Trunk Assessment Upper Extremity Assessment Upper Extremity Assessment: Overall WFL for tasks assessed   Lower Extremity Assessment Lower Extremity Assessment: Defer to PT evaluation   Cervical / Trunk Assessment Cervical / Trunk Assessment: Normal   Communication Communication Communication: No difficulties   Cognition Arousal/Alertness: Awake/alert Behavior During Therapy: WFL for tasks assessed/performed Overall Cognitive Status: Within Functional Limits for tasks assessed                                     General Comments  Sp O2 89-96% on RA at rest. With ambulation, pt O2 desatted to 78% after walking for 3 mins. (reliable pleth with  reading from ear probe). After 3 mins of rest pt O2 recovered to 89-91% sitting EOB.    Exercises     Shoulder Instructions      Home Living Family/patient expects to be discharged to:: Private residence Living Arrangements: Alone Available Help at Discharge: Family Type of Home: House Home Access: Stairs to enter Technical brewer of Steps: 2 Entrance Stairs-Rails: Right Home Layout: One level     Bathroom Shower/Tub: Teacher, early years/pre: Standard Bathroom Accessibility: Yes How Accessible: Accessible via walker Home Equipment: Beemer - 2 wheels;Cane - single point          Prior Functioning/Environment Level of Independence: Independent         Comments: Has 3 stray cats, like to feed birds in his yard        OT Problem List: Decreased strength;Decreased activity tolerance;Cardiopulmonary status limiting activity      OT Treatment/Interventions:      OT Goals(Current goals can be found in the care plan section) Acute Rehab OT Goals Patient Stated Goal: return home OT Goal Formulation: All assessment and education complete, DC therapy  OT Frequency:     Barriers to D/C:            Co-evaluation              AM-PAC OT "6 Clicks" Daily Activity     Outcome Measure Help from another person eating meals?: None Help from another person taking care of personal grooming?: None Help from another person toileting, which includes using toliet, bedpan, or urinal?: None Help from another person bathing (including washing, rinsing, drying)?: None Help from another person to put on and taking off regular upper body clothing?: None Help from another person to put on and taking off regular lower body clothing?: None 6 Click Score: 24   End of Session Equipment Utilized During Treatment: Gait belt Nurse Communication: Mobility status  Activity Tolerance: Patient tolerated treatment well Patient left: in chair;with call bell/phone within reach  OT Visit Diagnosis: Muscle weakness (generalized) (M62.81)                Time: 1357-1440 OT Time Calculation (min): 43 min Charges:  OT General Charges $OT Visit: 1 Visit OT Evaluation $OT Eval Low Complexity: 1 Low OT Treatments $Self Care/Home Management : 8-22 mins $Therapeutic Activity: 8-22 mins  Juwann Sherk H., OTR/L Acute Rehabilitation  Arshi Duarte Elane Yolanda Bonine 11/14/2020, 4:37 PM

## 2020-11-14 NOTE — Progress Notes (Signed)
Heart Failure Nurse Navigator Progress Note  Visited with patient to change HV TOC appt date as pt is still hospitalized today. Amio gtt turned off this AM.    TEE showed EF 40-45%, prior ECHO 50-55%.   Education Assessment and Provision:  Detailed education and instructions provided on heart failure disease management including the following:  Signs and symptoms of Heart Failure When to call the physician Importance of daily weights Low sodium diet Fluid restriction Medication management Anticipated future follow-up appointments  Patient education given on each of the above topics.  Patient acknowledges understanding via teach back method and acceptance of all instructions.  Education Materials:  "Living Better With Heart Failure" Booklet, HF zone tool, & Daily Weight Tracker Tool.  Patient has scale at home: yes, per pt.  Patient has pill box at home: yes, per pt.   Items for Follow-up on DC/TOC: -medication optimization -medication compliance -medication cost -continue HF education -encourage alcohol cessation  HV TOC appt: Thursday 5/5 @ 2pm, pt confirmed he has transportation. Gave directions and appt card with map.   Pricilla Holm, RN, BSN Heart Failure Nurse Navigator [phone]

## 2020-11-14 NOTE — Progress Notes (Signed)
Physical Therapy Treatment Patient Details Name: Jason Carter MRN: [id number] DOB: June 30, 1946 Today's Date: 11/14/2020    History of Present Illness Pt is a 75 y.o. male admitted 11/06/20 with SOB, tachycardia. Admitted for management of tachyarrhythmia. S/p TEE guided cardioversion 4/25. PMH includes stage I non small cell lung CA, gout, retinal edema, cataract surgery.   PT Comments    Pt progressing well with mobility. Tolerated increased ambulation distance with gait training. Pt still with DOE, but notes improvement from prior session. SpO2 down to 72% on RA with ambulation (unsure accurate reading based on pt's presentation), but quick rebound to 96% on 2L O2 Jason Carter. RN to check ambulatory saturations again later today with a different pulse ox probe to verify O2 needs.   Follow Up Recommendations  No PT follow up     Equipment Recommendations  None recommended by PT    Recommendations for Other Services       Precautions / Restrictions Precautions Precautions: Fall;Other (comment) Precaution Comments: Watch SpO2 Restrictions Weight Bearing Restrictions: No    Mobility  Bed Mobility Overal bed mobility: Independent                  Transfers Overall transfer level: Independent Equipment used: None                Ambulation/Gait Ambulation/Gait assistance: Supervision Gait Distance (Feet): 340 Feet Assistive device: None Gait Pattern/deviations: Step-through pattern;Decreased stride length Gait velocity: Decreased   General Gait Details: Slow, steady pace with supervision for safety/SpO2 monitoring; verbal cues for activity pacing and pursed lip breathing   Stairs             Wheelchair Mobility    Modified Rankin (Stroke Patients Only)       Balance Overall balance assessment: Needs assistance   Sitting balance-Leahy Scale: Good       Standing balance-Leahy Scale: Good                              Cognition  Arousal/Alertness: Awake/alert Behavior During Therapy: WFL for tasks assessed/performed Overall Cognitive Status: Within Functional Limits for tasks assessed                                        Exercises      General Comments General comments (skin integrity, edema, etc.): SpO2 88-94% on RA at rest; reading down to 72% on RA with ambulation (reliable pleth, but unsure accurate reading via finger probe), up to 96% on 2L with ambulation -- RN to check again later today with ear probe      Pertinent Vitals/Pain Pain Assessment: No/denies pain    Home Living                      Prior Function            PT Goals (current goals can now be found in the care plan section) Progress towards PT goals: Progressing toward goals    Frequency    Min 3X/week      PT Plan Current plan remains appropriate    Co-evaluation              AM-PAC PT "6 Clicks" Mobility   Outcome Measure  Help needed turning from your back to your side while in a flat bed without using  bedrails?: None Help needed moving from lying on your back to sitting on the side of a flat bed without using bedrails?: None Help needed moving to and from a bed to a chair (including a wheelchair)?: None Help needed standing up from a chair using your arms (e.g., wheelchair or bedside chair)?: None Help needed to walk in hospital room?: A Little Help needed climbing 3-5 steps with a railing? : A Little 6 Click Score: 22    End of Session Equipment Utilized During Treatment: Gait belt;Oxygen Activity Tolerance: Patient tolerated treatment well Patient left: in chair;with call bell/phone within reach Nurse Communication: Mobility status PT Visit Diagnosis: Difficulty in walking, not elsewhere classified (R26.2)     Time: 2706-2376 PT Time Calculation (min) (ACUTE ONLY): 21 min  Charges:  $Gait Training: 8-22 mins                     Mabeline Caras, PT, DPT Acute Rehabilitation  Services  Pager [phone] Office [place] 11/14/2020, 12:56 PM

## 2020-11-14 NOTE — Social Work (Addendum)
CSW received consult to assess if patient needs transportation and safe dc plan. CSW spoke with patient at bedside. Patient confirmed his grandaughter is going to come pick him up. Patient is going to stay with his Grandaughter. Patient initially had some concerns about his AC working at his home. Patient plans to turn it on to get it going to see if it will work. Patient has handyman that can fix it if it doesn't work when he returns home. No further questions reported at this time. CSW to continue to follow and assist with discharge planning needs.

## 2020-11-14 NOTE — Care Management Important Message (Signed)
Important Message  Patient Details  Name: Jason Carter MRN: [id number] Date of Birth: 1946-01-31   Medicare Important Message Given:  Yes     Shelda Altes 11/14/2020, 8:22 AM

## 2020-11-14 NOTE — TOC Initial Note (Signed)
Transition of Care (TOC) - Initial/Assessment Note  Marvetta Gibbons RN, BSN Transitions of Care Unit 4E- RN Case Manager See Treatment Team for direct phone # Cross Coverage for [place]   Patient Details  Name: Jason Carter MRN: [id number] Date of Birth: 01-Aug-1945  Transition of Care [facility]) CM/SW Contact:    Dawayne Patricia, RN Phone Number: 11/14/2020, 3:05 PM  Clinical Narrative:                 Pt from home, noted orders for [facility], CM spoke with pt at bedside to discuss [facility] needs and offer choice for [facility] agency- list provided Per CMS guidelines from medicare.gov website with star ratings (copy placed in shadow chart), after review pt has selected [facility] for his [place] needs.  Pt states he is hopeful for discharge home today,  Pt states he has family near by but they are not able to come assist very often.   Call has been made to [facility] for [facility] referral- per Meg with [facility] referral has been accepted and they will see pt in the home either Tomorrow or Thursday.   1500- noted pt has discharge order in for home- also note that order has been placed for home 02. Xarelto to be filled by [facility] pharmacy and delivered to room prior to discharge.    Call made to [employer] for home 02 needs- once processed for insurance approval- transport tank to be delivered to room for pt to use to transition home.   Expected Discharge Plan: [place] Barriers to Discharge: No Barriers Identified   Patient Goals and CMS Choice Patient states their goals for this hospitalization and ongoing recovery are:: return home CMS Medicare.gov Compare Post Acute Care list provided to:: Patient Choice offered to / list presented to : Patient  Expected Discharge Plan and Services Expected Discharge Plan: [place]   Discharge Planning Services: CM Consult Post Acute Care Choice: [place] Living arrangements for the past 2 months: Single Family  Home Expected Discharge Date: 11/14/20               DME Arranged: Oxygen DME Agency: [employer] Date DME Agency Contacted: 11/14/20 Time DME Agency Contacted: [phone] Representative spoke with at DME Agency: Thedore Mins HH Arranged: RN,Disease Management [place] Agency: [facility] Date [place]: 11/14/20 Time [place]: 1330 Representative spoke with at [place]: Meg  Prior Living Arrangements/Services Living arrangements for the past 2 months: [place] with:: Self Patient language and need for interpreter reviewed:: Yes Do you feel safe going back to the place where you live?: Yes      Need for Family Participation in Patient Care: Yes (Comment) Care giver support system in place?: Yes (comment) Current home services: DME Criminal Activity/Legal Involvement Pertinent to Current Situation/Hospitalization: No - Comment as needed  Activities of Daily Living Home Assistive Devices/Equipment: None ADL Screening (condition at time of admission) Patient's cognitive ability adequate to safely complete daily activities?: Yes Is the patient deaf or have difficulty hearing?: Yes Does the patient have difficulty seeing, even when wearing glasses/contacts?: Yes Does the patient have difficulty concentrating, remembering, or making decisions?: Yes (At times) Patient able to express need for assistance with ADLs?: Yes Does the patient have difficulty dressing or bathing?: No Independently performs ADLs?: Yes (appropriate for developmental age) Does the patient have difficulty walking or climbing stairs?: Yes (Due to shortness of breath) Weakness of Legs: Left Weakness  of Arms/Hands: Both  Permission Sought/Granted Permission sought to share information with : Chartered certified accountant granted to share information with : Yes, Verbal Permission Granted     Permission granted to share info w AGENCY: HH        Emotional  Assessment Appearance:: Appears stated age Attitude/Demeanor/Rapport: Engaged Affect (typically observed): Appropriate Orientation: : Oriented to Self,Oriented to Place,Oriented to  Time,Oriented to Situation Alcohol / Substance Use: Not Applicable Psych Involvement: No (comment)  Admission diagnosis:  Atrial flutter with rapid ventricular response [facility]) [I48.92] Patient Active Problem List   Diagnosis Date Noted  . Aortic atherosclerosis ([place]) 11/09/2020  . Emphysema lung ([place]) 11/09/2020  . Normocytic anemia 11/09/2020  . Tachycardia 11/06/2020  . Atrial flutter with rapid ventricular response ([place]) 11/06/2020  . Wrist pain, right 08/10/2020  . Allergic rhinitis 05/25/2020  . Malignant neoplasm of upper lobe of left lung ([place]) 05/25/2020  . History of tobacco abuse 04/06/2020  . Hypertension 04/06/2020  . Chronic gout 02/14/2017  . Prediabetes 02/11/2017  . History of appendicitis 04/26/2011   PCP:  Cato Mulligan, MD Pharmacy:   [id number] [place], [place] [address] Phone: [phone] Fax: [facility] [id number] [place] [address] Phone: [phone] Fax: [phone]  [facility] [address] [place] [address] Phone: [phone] Fax: [phone]     Social Determinants of Health (SDOH) Interventions Food Insecurity Interventions: Intervention Not Indicated Financial Strain Interventions: Intervention Not Indicated Housing Interventions: Intervention Not Indicated Physical Activity Interventions: Patient Refused Social Connections Interventions: Patient Refused Transportation Interventions: Intervention Not Indicated  Readmission Risk Interventions No flowsheet data found.

## 2020-11-14 NOTE — Progress Notes (Addendum)
SATURATION QUALIFICATIONS: (This note is used to comply with regulatory documentation for home oxygen)  Patient Saturations on Room Air at Rest = 93%  Patient Saturations on Room Air while Ambulating = 72%  Patient Saturations on 2 Liters of oxygen while Ambulating = 96%  Please briefly explain why patient needs home oxygen: Patient required supplemental oxygen to maintain SpO2 >/88% with activity (question if accurate pulse ox reading based on pt's presentation, although good waveform/pleth)  Mabeline Caras, PT, DPT Acute Rehabilitation Services  Pager [phone] Office [phone]

## 2020-11-14 NOTE — Progress Notes (Addendum)
   Spoke w/ pt re: HHRN  Described services.  He is agreeable, will order.  Rosaria Ferries, PA-C 11/14/2020 9:55 AM

## 2020-11-15 ENCOUNTER — Telehealth: Payer: Self-pay | Admitting: Student

## 2020-11-15 ENCOUNTER — Encounter (HOSPITAL_COMMUNITY): Payer: Medicare Other

## 2020-11-15 NOTE — Telephone Encounter (Signed)
TOC HFU APPT [facility] FOR 11/20/2020 @ 2:15 PM WITH DR Jerl Mina

## 2020-11-16 ENCOUNTER — Telehealth (HOSPITAL_COMMUNITY): Payer: Self-pay

## 2020-11-16 ENCOUNTER — Other Ambulatory Visit (HOSPITAL_COMMUNITY): Payer: Self-pay

## 2020-11-16 NOTE — Telephone Encounter (Signed)
Pharmacy Transitions of Care Follow-up Telephone Call  Date of discharge: 11/14/20 Discharge Diagnosis: Aflutter  How have you been since you were released from the hospital? Patient has been doing well. Has been watching his diet and sodium intake. Has a lot of mucus and congestion.  Medication changes made at discharge:  - START: Xarelto  Medication changes verified by the patient? Yes    Medication Accessibility:  Home Pharmacy: [facility]  Was the patient provided with refills on discharged medications? No  Have all prescriptions been transferred from [facility] to home pharmacy? NA  . Is the patient able to afford medications? Yes    Medication Review:  RIVAROXABAN (XARELTO)  Taking 20mg  daily with food. - Discussed importance of taking medication with food and around the same time everyday  - Reviewed potential DDIs with patient  - Advised patient of medications to avoid (NSAIDs, ASA)  - Educated that Tylenol (acetaminophen) will be the preferred analgesic to prevent risk of bleeding  - Emphasized importance of monitoring for signs and symptoms of bleeding (abnormal bruising, prolonged bleeding, nose bleeds, bleeding from gums, discolored urine, black tarry stools)  - Advised patient to alert all providers of anticoagulation therapy prior to starting a new medication or having a procedure   Follow-up Appointments:  PCP Hospital f/u appt confirmed? Dr. Sherry Ruffing in IM on 11/27/20  Specialist Hospital f/u appt confirmed? Seeing Cardiology on 11/23/20   If their condition worsens, is the pt aware to call PCP or go to the Emergency Dept.? yes  Final Patient Assessment: Patient doing well. Has follow ups and knows to get maintenance refills at follow ups.

## 2020-11-17 ENCOUNTER — Other Ambulatory Visit: Payer: Self-pay

## 2020-11-17 ENCOUNTER — Emergency Department (HOSPITAL_COMMUNITY)
Admission: EM | Admit: 2020-11-17 | Discharge: 2020-11-17 | Disposition: A | Discharge status: Home / Self Care | Payer: Medicare Other | Source: Home / Self Care | Attending: Emergency Medicine | Admitting: Emergency Medicine

## 2020-11-17 ENCOUNTER — Encounter (HOSPITAL_COMMUNITY): Payer: Self-pay

## 2020-11-17 ENCOUNTER — Emergency Department (HOSPITAL_COMMUNITY): Payer: Medicare Other

## 2020-11-17 ENCOUNTER — Other Ambulatory Visit: Payer: Self-pay | Admitting: Internal Medicine

## 2020-11-17 DIAGNOSIS — Z859 Personal history of malignant neoplasm, unspecified: Secondary | ICD-10-CM | POA: Insufficient documentation

## 2020-11-17 DIAGNOSIS — J439 Emphysema, unspecified: Secondary | ICD-10-CM | POA: Diagnosis not present

## 2020-11-17 DIAGNOSIS — U071 COVID-19: Secondary | ICD-10-CM | POA: Diagnosis not present

## 2020-11-17 DIAGNOSIS — Z87891 Personal history of nicotine dependence: Secondary | ICD-10-CM | POA: Insufficient documentation

## 2020-11-17 DIAGNOSIS — M25531 Pain in right wrist: Secondary | ICD-10-CM

## 2020-11-17 DIAGNOSIS — R7303 Prediabetes: Secondary | ICD-10-CM | POA: Diagnosis not present

## 2020-11-17 DIAGNOSIS — Z85118 Personal history of other malignant neoplasm of bronchus and lung: Secondary | ICD-10-CM | POA: Insufficient documentation

## 2020-11-17 DIAGNOSIS — H35033 Hypertensive retinopathy, bilateral: Secondary | ICD-10-CM | POA: Insufficient documentation

## 2020-11-17 DIAGNOSIS — R000 Tachycardia, unspecified: Secondary | ICD-10-CM | POA: Diagnosis not present

## 2020-11-17 DIAGNOSIS — I483 Typical atrial flutter: Secondary | ICD-10-CM | POA: Diagnosis not present

## 2020-11-17 DIAGNOSIS — I4892 Unspecified atrial flutter: Secondary | ICD-10-CM

## 2020-11-17 DIAGNOSIS — Z4502 Encounter for adjustment and management of automatic implantable cardiac defibrillator: Secondary | ICD-10-CM | POA: Insufficient documentation

## 2020-11-17 DIAGNOSIS — R079 Chest pain, unspecified: Secondary | ICD-10-CM | POA: Diagnosis not present

## 2020-11-17 DIAGNOSIS — I952 Hypotension due to drugs: Secondary | ICD-10-CM | POA: Diagnosis not present

## 2020-11-17 DIAGNOSIS — Z923 Personal history of irradiation: Secondary | ICD-10-CM | POA: Diagnosis not present

## 2020-11-17 LAB — BASIC METABOLIC PANEL
Anion gap: 8 (ref 5–15)
BUN: 11 mg/dL (ref 8–23)
CO2: 32 mmol/L (ref 22–32)
Calcium: 9.1 mg/dL (ref 8.9–10.3)
Chloride: 95 mmol/L — ABNORMAL LOW (ref 98–111)
Creatinine, Ser: 0.6 mg/dL — ABNORMAL LOW (ref 0.61–1.24)
GFR, Estimated: >60 mL/min (ref >60)
Glucose, Bld: 90 mg/dL (ref 70–99)
Potassium: 4.1 mmol/L (ref 3.5–5.1)
Sodium: 135 mmol/L (ref 135–145)

## 2020-11-17 LAB — CBC WITH DIFFERENTIAL/PLATELET
Abs Immature Granulocytes: 0.01 10*3/uL (ref 0.00–0.07)
Basophils Absolute: 0 10*3/uL (ref 0.0–0.1)
Basophils Relative: 0 %
Eosinophils Absolute: 0.2 10*3/uL (ref 0.0–0.5)
Eosinophils Relative: 3 %
HCT: 41.2 % (ref 39.0–52.0)
Hemoglobin: 12.5 g/dL — ABNORMAL LOW (ref 13.0–17.0)
Immature Granulocytes: 0 %
Lymphocytes Relative: 22 %
Lymphs Abs: 1.3 10*3/uL (ref 0.7–4.0)
MCH: 28.2 pg (ref 26.0–34.0)
MCHC: 30.3 g/dL (ref 30.0–36.0)
MCV: 92.8 fL (ref 80.0–100.0)
Monocytes Absolute: 0.6 10*3/uL (ref 0.1–1.0)
Monocytes Relative: 10 %
Neutro Abs: 3.9 10*3/uL (ref 1.7–7.7)
Neutrophils Relative %: 65 %
Platelets: 165 10*3/uL (ref 150–400)
RBC: 4.44 MIL/uL (ref 4.22–5.81)
RDW: 13.4 % (ref 11.5–15.5)
WBC: 6 10*3/uL (ref 4.0–10.5)
nRBC: 0 % (ref 0.0–0.2)

## 2020-11-17 LAB — TROPONIN I (HIGH SENSITIVITY)
Troponin I (High Sensitivity): 7 ng/L (ref <18)
Troponin I (High Sensitivity): 8 ng/L (ref <18)

## 2020-11-17 MED ORDER — SODIUM CHLORIDE 0.9 % IV BOLUS
500.0000 mL | Freq: Once | INTRAVENOUS | Status: AC
  Administered 2020-11-17: 500 mL via INTRAVENOUS

## 2020-11-17 MED ORDER — PROPOFOL 10 MG/ML IV BOLUS
0.5000 mg/kg | Freq: Once | INTRAVENOUS | Status: DC
  Filled 2020-11-17: qty 20

## 2020-11-17 MED ORDER — PROPOFOL 10 MG/ML IV BOLUS
INTRAVENOUS | Status: AC | PRN
  Administered 2020-11-17: 35.1 mg via INTRAVENOUS
  Administered 2020-11-17: 20 mg via INTRAVENOUS

## 2020-11-17 NOTE — ED Triage Notes (Signed)
Pt states his heart rate was in 120s while not doing anything. Pt states he tried to sit down and lay down to see if it would decrease w/o success.  Pt on home 02 machine 2LNC.  Pt denies chest pain, SOB, nausea, vomiting, weakness or dizziness.

## 2020-11-17 NOTE — ED Triage Notes (Signed)
Emergency Medicine Provider Triage Evaluation Note  Fields Jason Carter , a 75 y.o. male  was evaluated in triage.  Pt complains of palpitations. No cp however tightness. Hx of afib. Feels similar. On Xarelto. Recent dc  for similar complaints  Review of Systems  Positive: Chest tightness, palpitations Negative: Sob, leg swelling  Physical Exam  There were no vitals taken for this visit. Gen:   Awake, no distress   HEENT:  Atraumatic  Resp:  Normal effort , on home 02 Cardiac:  Normal rat Abd:   Nondistended, nontender  MSK:   Moves extremities without difficulty  Neuro:  Speech clear   Medical Decision Making  Medically screening exam initiated at 5:43 PM.  Appropriate orders placed.  [name] was informed that the remainder of the evaluation will be completed by another provider, this initial triage assessment does not replace that evaluation, and the importance of remaining in the ED until their evaluation is complete.  Clinical Impression  Tachycardia, afib   [name] A, PA-C 11/17/20 1744
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
## 2020-11-17 NOTE — Telephone Encounter (Signed)
Transition Care Management Follow-up Telephone Call  Date of discharge and from where: Discharged 4/26 from the hospital.  How have you been since you were released from the hospital? Voice no c/o's.  Any questions or concerns? No. Items Reviewed:  Did the pt receive and understand the discharge instructions provided? Yes   Medications obtained and verified? Yes . States he's on a blood thinner and was instructed to watch for bruising.  Any new allergies since your discharge? No.  Dietary orders reviewed? Yes,states he 's on a sodium and fluid restrictions.  Do you have support at home? Yes, states his son checks on him.  Home Care and Equipment/Supplies: Were home health services ordered? Yes. Pt states nurse from [facility] has been to see him and will come once a week. And he has oxygen from [employer].  Functional Questionnaire: (I = Independent and D = Dependent) ADLs:self. States he prepares his own meals and his son has purchased some frozen meals; aware to watch sodium. Follow up appointments reviewed:   PCP Hospital f/u appt confirmed? Yes  Scheduled to see Dr Sherry Ruffing on 5/9 @ 1415PM.   Are transportation arrangements needed? States no.  If their condition worsens, is the pt aware to call PCP or go to the Emergency Dept.? Yes.  Was the patient provided with contact information for the PCP's office or ED? Yes.  Was to pt encouraged to call back with questions or concerns? Yes.

## 2020-11-17 NOTE — Telephone Encounter (Signed)
Transition Care Management Unsuccessful Follow-up Telephone Call  Date of discharge and from where:  Discharged 11/14/20 from the hospital.  Attempts:  1st Attempt  Reason for unsuccessful TCM follow-up call:  No answer; left message why I was calling and to call the office.
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
## 2020-11-18 ENCOUNTER — Telehealth: Payer: Self-pay | Admitting: Home Health

## 2020-11-18 ENCOUNTER — Other Ambulatory Visit: Payer: Self-pay

## 2020-11-18 ENCOUNTER — Inpatient Hospital Stay (HOSPITAL_COMMUNITY)
Admission: EM | Admit: 2020-11-18 | Discharge: 2020-11-20 | DRG: 308 | Disposition: A | Discharge status: Home / Self Care | Payer: Medicare Other | Attending: Internal Medicine | Admitting: Internal Medicine

## 2020-11-18 ENCOUNTER — Encounter (HOSPITAL_COMMUNITY): Payer: Self-pay | Admitting: Emergency Medicine

## 2020-11-18 DIAGNOSIS — M109 Gout, unspecified: Secondary | ICD-10-CM | POA: Diagnosis present

## 2020-11-18 DIAGNOSIS — F419 Anxiety disorder, unspecified: Secondary | ICD-10-CM | POA: Diagnosis present

## 2020-11-18 DIAGNOSIS — Z7901 Long term (current) use of anticoagulants: Secondary | ICD-10-CM

## 2020-11-18 DIAGNOSIS — I2781 Cor pulmonale (chronic): Secondary | ICD-10-CM | POA: Diagnosis not present

## 2020-11-18 DIAGNOSIS — Z8679 Personal history of other diseases of the circulatory system: Secondary | ICD-10-CM | POA: Diagnosis present

## 2020-11-18 DIAGNOSIS — Z9841 Cataract extraction status, right eye: Secondary | ICD-10-CM

## 2020-11-18 DIAGNOSIS — I4891 Unspecified atrial fibrillation: Secondary | ICD-10-CM | POA: Diagnosis present

## 2020-11-18 DIAGNOSIS — H35039 Hypertensive retinopathy, unspecified eye: Secondary | ICD-10-CM | POA: Diagnosis present

## 2020-11-18 DIAGNOSIS — Z923 Personal history of irradiation: Secondary | ICD-10-CM

## 2020-11-18 DIAGNOSIS — Z9842 Cataract extraction status, left eye: Secondary | ICD-10-CM

## 2020-11-18 DIAGNOSIS — U071 COVID-19: Secondary | ICD-10-CM | POA: Diagnosis present

## 2020-11-18 DIAGNOSIS — I483 Typical atrial flutter: Principal | ICD-10-CM | POA: Diagnosis present

## 2020-11-18 DIAGNOSIS — Z9049 Acquired absence of other specified parts of digestive tract: Secondary | ICD-10-CM

## 2020-11-18 DIAGNOSIS — Z87891 Personal history of nicotine dependence: Secondary | ICD-10-CM

## 2020-11-18 DIAGNOSIS — I4892 Unspecified atrial flutter: Secondary | ICD-10-CM | POA: Diagnosis not present

## 2020-11-18 DIAGNOSIS — Z79899 Other long term (current) drug therapy: Secondary | ICD-10-CM

## 2020-11-18 DIAGNOSIS — R7303 Prediabetes: Secondary | ICD-10-CM | POA: Diagnosis present

## 2020-11-18 DIAGNOSIS — I952 Hypotension due to drugs: Secondary | ICD-10-CM | POA: Diagnosis not present

## 2020-11-18 DIAGNOSIS — Z85118 Personal history of other malignant neoplasm of bronchus and lung: Secondary | ICD-10-CM

## 2020-11-18 DIAGNOSIS — Z8249 Family history of ischemic heart disease and other diseases of the circulatory system: Secondary | ICD-10-CM

## 2020-11-18 DIAGNOSIS — J439 Emphysema, unspecified: Secondary | ICD-10-CM | POA: Diagnosis present

## 2020-11-18 DIAGNOSIS — J449 Chronic obstructive pulmonary disease, unspecified: Secondary | ICD-10-CM | POA: Diagnosis not present

## 2020-11-18 DIAGNOSIS — I071 Rheumatic tricuspid insufficiency: Secondary | ICD-10-CM | POA: Diagnosis present

## 2020-11-18 HISTORY — DX: Unspecified atrial fibrillation: I48.91

## 2020-11-18 LAB — BASIC METABOLIC PANEL
Anion gap: 9 (ref 5–15)
BUN: 15 mg/dL (ref 8–23)
CO2: 31 mmol/L (ref 22–32)
Calcium: 9.1 mg/dL (ref 8.9–10.3)
Chloride: 99 mmol/L (ref 98–111)
Creatinine, Ser: 0.71 mg/dL (ref 0.61–1.24)
GFR, Estimated: >60 mL/min (ref >60)
Glucose, Bld: 90 mg/dL (ref 70–99)
Potassium: 4.5 mmol/L (ref 3.5–5.1)
Sodium: 139 mmol/L (ref 135–145)

## 2020-11-18 LAB — CBC
HCT: 42.9 % (ref 39.0–52.0)
Hemoglobin: 13 g/dL (ref 13.0–17.0)
MCH: 28.1 pg (ref 26.0–34.0)
MCHC: 30.3 g/dL (ref 30.0–36.0)
MCV: 92.7 fL (ref 80.0–100.0)
Platelets: 169 10*3/uL (ref 150–400)
RBC: 4.63 MIL/uL (ref 4.22–5.81)
RDW: 13.4 % (ref 11.5–15.5)
WBC: 6.8 10*3/uL (ref 4.0–10.5)
nRBC: 0 % (ref 0.0–0.2)

## 2020-11-18 LAB — MAGNESIUM: Magnesium: 2 mg/dL (ref 1.7–2.4)

## 2020-11-18 MED ORDER — DILTIAZEM LOAD VIA INFUSION
20.0000 mg | Freq: Once | INTRAVENOUS | Status: AC
  Administered 2020-11-18: 20 mg via INTRAVENOUS
  Filled 2020-11-18: qty 20

## 2020-11-18 MED ORDER — ONDANSETRON HCL 4 MG/2ML IJ SOLN: 4.0000 mg | Freq: Four times a day (QID) | INTRAMUSCULAR | Status: DC | PRN

## 2020-11-18 MED ORDER — DILTIAZEM HCL-DEXTROSE 125-5 MG/125ML-% IV SOLN (PREMIX)
5.0000 mg/h | INTRAVENOUS | Status: DC
  Administered 2020-11-18: 5 mg/h via INTRAVENOUS
  Filled 2020-11-18: qty 125

## 2020-11-18 MED ORDER — AMIODARONE HCL IN DEXTROSE 360-4.14 MG/200ML-% IV SOLN
30.0000 mg/h | INTRAVENOUS | Status: DC
  Administered 2020-11-19 – 2020-11-20 (×3): 30 mg/h via INTRAVENOUS
  Filled 2020-11-18 (×3): qty 200

## 2020-11-18 MED ORDER — GUAIFENESIN-DM 100-10 MG/5ML PO SYRP
5.0000 mL | ORAL_SOLUTION | ORAL | Status: DC | PRN
  Administered 2020-11-19: 5 mL via ORAL
  Filled 2020-11-18: qty 5

## 2020-11-18 MED ORDER — AMIODARONE LOAD VIA INFUSION
150.0000 mg | Freq: Once | INTRAVENOUS | Status: AC
  Administered 2020-11-18: 150 mg via INTRAVENOUS
  Filled 2020-11-18: qty 83.34

## 2020-11-18 MED ORDER — RIVAROXABAN 20 MG PO TABS
20.0000 mg | ORAL_TABLET | Freq: Every day | ORAL | Status: DC
  Administered 2020-11-18 – 2020-11-19 (×2): 20 mg via ORAL
  Filled 2020-11-18 (×3): qty 1

## 2020-11-18 MED ORDER — AMIODARONE HCL IN DEXTROSE 360-4.14 MG/200ML-% IV SOLN
60.0000 mg/h | INTRAVENOUS | Status: AC
  Administered 2020-11-18 (×2): 60 mg/h via INTRAVENOUS
  Filled 2020-11-18 (×2): qty 200

## 2020-11-18 MED ORDER — ALBUTEROL SULFATE HFA 108 (90 BASE) MCG/ACT IN AERS
1.0000 | INHALATION_SPRAY | Freq: Four times a day (QID) | RESPIRATORY_TRACT | Status: DC | PRN
  Filled 2020-11-18: qty 6.7

## 2020-11-18 MED ORDER — ACETAMINOPHEN 325 MG PO TABS
650.0000 mg | ORAL_TABLET | ORAL | Status: DC | PRN
  Filled 2020-11-18: qty 2

## 2020-11-18 MED ORDER — SODIUM CHLORIDE 0.9 % IV BOLUS
1000.0000 mL | Freq: Once | INTRAVENOUS | Status: AC
  Administered 2020-11-18: 1000 mL via INTRAVENOUS

## 2020-11-18 NOTE — ED Notes (Addendum)
dilt bolus stopped d/t hypotension. MD to bedside. NS bolus up and ready. Bed in trendelenburg.

## 2020-11-18 NOTE — Progress Notes (Signed)
   11/18/20 1925  Assess: MEWS Score  Temp 98 F (36.7 C)  BP 116/87  Pulse Rate (!) 128  ECG Heart Rate (!) 127  Resp (!) 24  Level of Consciousness Alert  SpO2 91 %  O2 Device Nasal Cannula  O2 Flow Rate (L/min) 2 L/min  Assess: MEWS Score  MEWS Temp 0  MEWS Systolic 0  MEWS Pulse 2  MEWS RR 1  MEWS LOC 0  MEWS Score 3  MEWS Score Color Yellow  Assess: if the MEWS score is Yellow or Red  Were vital signs taken at a resting state? No  Focused Assessment No change from prior assessment  Early Detection of Sepsis Score *See Row Information* Low  MEWS guidelines implemented *See Row Information* Yes  Treat  Pain Scale 0-10  Pain Score 0  Patients Stated Pain Goal 0  Take Vital Signs  Increase Vital Sign Frequency  Yellow: Q 2hr X 2 then Q 4hr X 2, if remains yellow, continue Q 4hrs  Escalate  MEWS: Escalate Yellow: discuss with charge nurse/RN and consider discussing with provider and RRT  Notify: Charge Nurse/RN  Name of Charge Nurse/RN Notified Heather, RN  Date Charge Nurse/RN Notified 11/18/20  Time Charge Nurse/RN Notified 1935

## 2020-11-18 NOTE — ED Triage Notes (Signed)
Pt states he was seen in ED for Afib yesterday.  States he felt fine when he got home last night and this morning.  Took his vitals and states HR was 120s.  Denies chest pain or increased SOB.  Wears home O2.
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
## 2020-11-18 NOTE — ED Notes (Signed)
Patient repositioned to position of comfort. Warm blanket given. Patient is alert and calm, resting on stretcher and on all monitors as appropriate at this time. Call bell within reach. Side rails up x 2. Pulses +2 radial and skin is warm w brisk cap refill
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
## 2020-11-18 NOTE — ED Notes (Signed)
NS bolus started.

## 2020-11-18 NOTE — ED Notes (Signed)
Patient care started by this RN. Patient placed in gown, on monitors, 2 PIV's placed.

## 2020-11-18 NOTE — Progress Notes (Signed)
Received to 6E10 via stretcher from ED. O2 @ 2 L [place]. Denies CP, SOB or dizziness. C/O generalized weakness and fatigue. HR = 130's in Aflutter. Order received for Amio gtt with bolus. Oriented to room/ unit. Call bell and phone placed in reach. Encouraged to call for needs. Verbalized understanding.

## 2020-11-18 NOTE — ED Notes (Signed)
patient on monitors and with RN transported to 6E10 via stretcher.

## 2020-11-18 NOTE — Telephone Encounter (Signed)
   Patient called after hours line reporting HR 127-133 bpm at home. BP 117/87. He is not having any dizziness, chest pain, heart palpitation or flutter. He has chronic SOB with exertion and on [place] oxygen. He is wondering if he should come to ER for evaluation for rapid HR. Chart reviewed, typical flutter required TEE with DCCV on 4/25, discharged 4/26, returned 4/29 for recurrence of a flutter, s/p another DCCV at ED yesterday with conversion to NSR. Concern recurrence of A flutter, advised patient return to ER. Patient agreeable.    Margie Billet, ANP-BC  11/18/20 1305

## 2020-11-19 DIAGNOSIS — Z87891 Personal history of nicotine dependence: Secondary | ICD-10-CM | POA: Diagnosis not present

## 2020-11-19 DIAGNOSIS — Z923 Personal history of irradiation: Secondary | ICD-10-CM | POA: Diagnosis not present

## 2020-11-19 DIAGNOSIS — I071 Rheumatic tricuspid insufficiency: Secondary | ICD-10-CM | POA: Diagnosis present

## 2020-11-19 DIAGNOSIS — Z85118 Personal history of other malignant neoplasm of bronchus and lung: Secondary | ICD-10-CM | POA: Diagnosis not present

## 2020-11-19 DIAGNOSIS — I483 Typical atrial flutter: Secondary | ICD-10-CM | POA: Diagnosis present

## 2020-11-19 DIAGNOSIS — R000 Tachycardia, unspecified: Secondary | ICD-10-CM | POA: Diagnosis not present

## 2020-11-19 DIAGNOSIS — U071 COVID-19: Secondary | ICD-10-CM

## 2020-11-19 DIAGNOSIS — Z9841 Cataract extraction status, right eye: Secondary | ICD-10-CM | POA: Diagnosis not present

## 2020-11-19 DIAGNOSIS — J439 Emphysema, unspecified: Secondary | ICD-10-CM | POA: Diagnosis present

## 2020-11-19 DIAGNOSIS — M109 Gout, unspecified: Secondary | ICD-10-CM | POA: Diagnosis present

## 2020-11-19 DIAGNOSIS — Z79899 Other long term (current) drug therapy: Secondary | ICD-10-CM | POA: Diagnosis not present

## 2020-11-19 DIAGNOSIS — I4892 Unspecified atrial flutter: Secondary | ICD-10-CM | POA: Diagnosis not present

## 2020-11-19 DIAGNOSIS — Z9842 Cataract extraction status, left eye: Secondary | ICD-10-CM | POA: Diagnosis not present

## 2020-11-19 DIAGNOSIS — H35039 Hypertensive retinopathy, unspecified eye: Secondary | ICD-10-CM | POA: Diagnosis present

## 2020-11-19 DIAGNOSIS — I4891 Unspecified atrial fibrillation: Secondary | ICD-10-CM | POA: Diagnosis present

## 2020-11-19 DIAGNOSIS — Z8249 Family history of ischemic heart disease and other diseases of the circulatory system: Secondary | ICD-10-CM | POA: Diagnosis not present

## 2020-11-19 DIAGNOSIS — Z7901 Long term (current) use of anticoagulants: Secondary | ICD-10-CM | POA: Diagnosis not present

## 2020-11-19 DIAGNOSIS — R7303 Prediabetes: Secondary | ICD-10-CM | POA: Diagnosis present

## 2020-11-19 DIAGNOSIS — Z9049 Acquired absence of other specified parts of digestive tract: Secondary | ICD-10-CM | POA: Diagnosis not present

## 2020-11-19 DIAGNOSIS — F419 Anxiety disorder, unspecified: Secondary | ICD-10-CM | POA: Diagnosis present

## 2020-11-19 HISTORY — DX: COVID-19: U07.1

## 2020-11-19 LAB — BASIC METABOLIC PANEL
Anion gap: 5 (ref 5–15)
BUN: 12 mg/dL (ref 8–23)
CO2: 35 mmol/L — ABNORMAL HIGH (ref 22–32)
Calcium: 8.7 mg/dL — ABNORMAL LOW (ref 8.9–10.3)
Chloride: 99 mmol/L (ref 98–111)
Creatinine, Ser: 0.66 mg/dL (ref 0.61–1.24)
GFR, Estimated: >60 mL/min (ref >60)
Glucose, Bld: 90 mg/dL (ref 70–99)
Potassium: 4.3 mmol/L (ref 3.5–5.1)
Sodium: 139 mmol/L (ref 135–145)

## 2020-11-19 LAB — CBC
HCT: 38.4 % — ABNORMAL LOW (ref 39.0–52.0)
Hemoglobin: 11.8 g/dL — ABNORMAL LOW (ref 13.0–17.0)
MCH: 28.4 pg (ref 26.0–34.0)
MCHC: 30.7 g/dL (ref 30.0–36.0)
MCV: 92.3 fL (ref 80.0–100.0)
Platelets: 144 10*3/uL — ABNORMAL LOW (ref 150–400)
RBC: 4.16 MIL/uL — ABNORMAL LOW (ref 4.22–5.81)
RDW: 13.6 % (ref 11.5–15.5)
WBC: 6.4 10*3/uL (ref 4.0–10.5)
nRBC: 0 % (ref 0.0–0.2)

## 2020-11-19 LAB — MAGNESIUM: Magnesium: 2 mg/dL (ref 1.7–2.4)

## 2020-11-19 MED ORDER — OFF THE BEAT BOOK
Freq: Once | Status: AC
  Filled 2020-11-19: qty 1

## 2020-11-19 MED ORDER — ADULT MULTIVITAMIN W/MINERALS CH
1.0000 | ORAL_TABLET | Freq: Every day | ORAL | Status: DC
  Administered 2020-11-19 – 2020-11-20 (×2): 1 via ORAL
  Filled 2020-11-19 (×2): qty 1
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
## 2020-11-20 DIAGNOSIS — I483 Typical atrial flutter: Principal | ICD-10-CM

## 2020-11-20 LAB — SARS CORONAVIRUS 2 BY RT PCR (HOSPITAL ORDER, PERFORMED IN ~~LOC~~ HOSPITAL LAB): SARS Coronavirus 2: POSITIVE — AB

## 2020-11-20 SURGERY — A-FLUTTER ABLATION

## 2020-11-20 MED ORDER — AMIODARONE HCL 200 MG PO TABS
200.0000 mg | ORAL_TABLET | Freq: Two times a day (BID) | ORAL | Status: DC
  Administered 2020-11-20: 200 mg via ORAL
  Filled 2020-11-20: qty 1

## 2020-11-20 MED ORDER — AMIODARONE HCL 200 MG PO TABS: 200.0000 mg | ORAL_TABLET | Freq: Two times a day (BID) | ORAL | 6 refills | Status: DC

## 2020-11-20 MED ORDER — BISOPROLOL FUMARATE 5 MG PO TABS: 2.5000 mg | ORAL_TABLET | Freq: Every day | ORAL | 6 refills | Status: DC

## 2020-11-20 MED ORDER — BISOPROLOL FUMARATE 5 MG PO TABS
2.5000 mg | ORAL_TABLET | Freq: Every day | ORAL | Status: DC
  Administered 2020-11-20: 2.5 mg via ORAL
  Filled 2020-11-20 (×2): qty 1

## 2020-11-20 NOTE — Progress Notes (Signed)
Lab notified patient tested Covid + . Patient notified and placed on Isolation.  PA [place] notified.

## 2020-11-20 NOTE — Progress Notes (Addendum)
Electrophysiology Rounding Note  Patient Name: Jason Carter Date of Encounter: 11/20/2020  Primary Cardiologist: Kirk Ruths, MD Electrophysiologist: New to Dr. Lovena Le   Subjective   No new complaints this am  Inpatient Medications    Scheduled Meds:  multivitamin with minerals  1 tablet Oral Daily   rivaroxaban  20 mg Oral Q supper   Continuous Infusions:  amiodarone 30 mg/hr (11/20/20 0820)   PRN Meds: acetaminophen, albuterol, guaiFENesin-dextromethorphan, ondansetron (ZOFRAN) IV   Vital Signs    Vitals:   11/19/20 2008 11/20/20 0008 11/20/20 0500 11/20/20 0819  BP: 117/76 121/85 124/79 119/75  Pulse: (!) 125 (!) 119  (!) 122  Resp: 20 16  18   Temp: 98.2 F (36.8 C) 98.3 F (36.8 C) 97.7 F (36.5 C) 98.7 F (37.1 C)  TempSrc: Oral Oral Oral Oral  SpO2: 98% 97%  99%  Weight:   67.9 kg   Height:   5\' 7"  (1.702 m)     Intake/Output Summary (Last 24 hours) at 11/20/2020 0821 Last data filed at 11/20/2020 0820 Gross per 24 hour  Intake 286.3 ml  Output 700 ml  Net -413.7 ml   Filed Weights   11/18/20 1925 11/20/20 0500  Weight: 68.8 kg 67.9 kg    Physical Exam    GEN- The patient is well appearing, alert and oriented x 3 today.   Head- normocephalic, atraumatic Eyes-  Sclera clear, conjunctiva pink Ears- hearing intact Oropharynx- clear Neck- supple Lungs- Clear to ausculation bilaterally, normal work of breathing Heart- Regular and tachy rate and rhythm, no murmurs, rubs or gallops GI- soft, NT, ND, + BS Extremities- no clubbing or cyanosis. No edema Skin- no rash or lesion Psych- euthymic mood, full affect Neuro- strength and sensation are intact  Labs    CBC Recent Labs    11/17/20 1752 11/18/20 1459 11/19/20 0150  WBC 6.0 6.8 6.4  NEUTROABS 3.9  --   --   HGB 12.5* 13.0 11.8*  HCT 41.2 42.9 38.4*  MCV 92.8 92.7 92.3  PLT 165 169 790*   Basic Metabolic Panel Recent Labs    11/18/20 1459 11/18/20 1910 11/19/20 0150  NA 139   --  139  K 4.5  --  4.3  CL 99  --  99  CO2 31  --  35*  GLUCOSE 90  --  90  BUN 15  --  12  CREATININE 0.71  --  0.66  CALCIUM 9.1  --  8.7*  MG  --  2.0 2.0   Liver Function Tests No results for input(s): AST, ALT, ALKPHOS, BILITOT, PROT, ALBUMIN in the last 72 hours. No results for input(s): LIPASE, AMYLASE in the last 72 hours. Cardiac Enzymes No results for input(s): CKTOTAL, CKMB, CKMBINDEX, TROPONINI in the last 72 hours.   Telemetry    Atrial flutter 100-120s (personally reviewed)  Radiology    No results found.  Patient Profile     Jason Carter is a 75 y.o. male with a hx of atrial flutter, emphysema, anxiety, anemia, stage I non-small cell lung cancer of the left upper lobe status post XRT, prediabetes who is being seen today for the evaluation of atrial flutter at the request of Mihai Coritrou.  Assessment & Plan    1. Typical appearing atrial flutter Appears to have both clockwise and counterclockwise isthmus dependent flutter.  He is NPO.  Dr. Lovena Le has seen and discussed.  He on schedule for today, timing pending other cases.  Continue Xarelto.  Hold IV amio on call.    For questions or updates, please contact [place] Please consult [email or web address] for contact info under Cardiology/STEMI.  Signed, Shirley Friar, PA-C  11/20/2020, 8:21 AM   EP Attending  Patient seen and examined. Agree with the findings as noted above. The patient presents with recurrent symptomatic atrial flutter with a RVR. He has mild/modest CHF symptoms. He would be a good candidate for catheter ablation but has been found to be Covid positive despite a lack of symptoms. I would recommend he be discharged home on rate control with amio and beta blocker and return in 3-4 weeks for ablation. Continue systemic anti-coagulation.    Carleene Overlie Jalesa Thien,MD

## 2020-11-20 NOTE — Care Management (Signed)
11-20-20 Patient presented for recurrence of atrial flutter with RVR. Patient was previously set up with [facility] for [facility] RN services. Patient is currently active with [facility] and will need [facility] RN orders and F2F once stable to transition home.  Case Manager will continue to follow for additional needs. Bethena Roys, RN,BSN Case Manager

## 2020-11-20 NOTE — Progress Notes (Signed)
Heart Failure Nurse Navigator Progress Note  [place], [place] EP reached out to state pt now COVID-19 positive. Per policy woud have to move clinic appointment back 14 days after positive test. This admission has been largely related to Atrial Flutter. Pt DC 4/26, returned to ED 4/29-cardioverted in ED-->DC. 4/30 back in ED for HR 130s--> admitted, EP consulted, planned DC 5/2 today.   Canceled HV TOC appt as pt has follow up with EP 5/13.   Pricilla Holm, RN, BSN Heart Failure Nurse Navigator [phone]

## 2020-11-20 NOTE — Discharge Summary (Addendum)
ELECTROPHYSIOLOGY DISCHARGE SUMMARY    Patient ID: Jason Carter,  MRN: [id number], DOB/AGE: 75-Jul-1947 75 y.o.  Admit date: 11/18/2020 Discharge date: 11/20/2020  Primary Care Physician: Cato Mulligan, MD  Primary Cardiologist: Kirk Ruths, MD  Electrophysiologist: Dr. Lovena Le  Primary Discharge Diagnosis:  Atrial flutter, typical appearing (both clockwise and counterclockwise)  Secondary Discharge Diagnosis:  COVID infection (incidental)  Brief HPI: Jason Carter is a 75 y.o. male with a history of atrial flutter, emphysema, anxiety, anemia, stage I non-small cell lung cancer of the left upper lobe status post XRT, and prediabetes admitted for Atrial flutter.   Hospital Course:  The patient was admitted from ED 11/17/2020 with elevated atrial rates. Overall he was feeling OK, observed on amiodarone gtt. EP consulted with multiple recent paroxysms and was tentatively scheduled for flutter ablation. Unfortunately, pre-operative testing showed COVID infection. Ablation was deferred to outpatient setting. He was compensated so no plan for immediate [facility]. Will let load on amiodarone and follow up in clinic next week. Will also go ahead and scheduled Atrial flutter ablation in the coming weeks. hey were monitored on telemetry overnight which demonstrated atrial flutter with HRs 110-120s. The patient was examined by Dr. Lovena Le and considered to be stable for discharge with plan above. The patient will be seen back by  EP APP in 1-2 weeks for post hospital care.   Physical Exam: Vitals:   11/20/20 0008 11/20/20 0500 11/20/20 0819 11/20/20 1149  BP: 121/85 124/79 119/75 118/70  Pulse: (!) 119  (!) 122 (!) 124  Resp: 16  18 16   Temp: 98.3 F (36.8 C) 97.7 F (36.5 C) 98.7 F (37.1 C) 97.9 F (36.6 C)  TempSrc: Oral Oral Oral Oral  SpO2: 97%  99% 97%  Weight:  67.9 kg    Height:  5\' 7"  (1.702 m)      GEN- The patient is well appearing, alert and oriented x 3 today.   HEENT:  normocephalic, atraumatic; sclera clear, conjunctiva pink; hearing intact; oropharynx clear; neck supple  Lungs- Clear to ausculation bilaterally, normal work of breathing.  No wheezes, rales, rhonchi Heart- Regular rate and rhythm, no murmurs, rubs or gallops  GI- soft, non-tender, non-distended, bowel sounds present  Extremities- no clubbing, cyanosis, or edema; DP/PT/radial pulses 2+ bilaterally, groin without hematoma/bruit MS- no significant deformity or atrophy Skin- warm and dry, no rash or lesion Psych- euthymic mood, full affect Neuro- strength and sensation are intact   Labs:   Lab Results  Component Value Date   WBC 6.4 11/19/2020   HGB 11.8 (L) 11/19/2020   HCT 38.4 (L) 11/19/2020   MCV 92.3 11/19/2020   PLT 144 (L) 11/19/2020    Recent Labs  Lab 11/19/20 0150  NA 139  K 4.3  CL 99  CO2 35*  BUN 12  CREATININE 0.66  CALCIUM 8.7*  GLUCOSE 90     Discharge Medications:  Allergies as of 11/20/2020   No Known Allergies      Medication List     TAKE these medications    albuterol 108 (90 Base) MCG/ACT inhaler Commonly known as: VENTOLIN HFA Inhale 1-2 puffs into the lungs every 6 (six) hours as needed for wheezing or shortness of breath.   amiodarone 200 MG tablet Commonly known as: PACERONE Take 1 tablet (200 mg total) by mouth 2 (two) times daily.   ARTHRITIS PAIN RELIEF EX Apply 1 application topically daily as needed (knee pain).   bisoprolol 5 MG tablet Commonly known as: ZEBETA  Take 0.5 tablets (2.5 mg total) by mouth daily. Start taking on: Nov 21, 2020   Centrum Silver 50+Men Tabs Take 1 tablet by mouth daily.   diclofenac Sodium 1 % Gel Commonly known as: VOLTAREN Apply 2 g topically 4 (four) times daily. What changed:   when to take this  reasons to take this   fluticasone 50 MCG/ACT nasal spray Commonly known as: Flonase Place 1 spray into both nostrils daily. What changed:   when to take this  reasons to take this    hydrocortisone cream 1 % Apply 1 application topically daily as needed for itching.   ibuprofen 200 MG tablet Commonly known as: ADVIL Take 400-600 mg by mouth every 6 (six) hours as needed for mild pain or headache.   multivitamin with minerals Tabs tablet Take 1 tablet by mouth daily.   neomycin-bacitracin-polymyxin ointment Commonly known as: NEOSPORIN Apply 1 application topically as needed (to affected sites- for wound care).   OXYGEN Inhale into the lungs as directed.   Xarelto 20 MG Tabs tablet Generic drug: rivaroxaban Take 1 tablet (20 mg total) by mouth daily with supper. What changed: when to take this        Disposition:     Follow-up Information     Shirley Friar, PA-C Follow up.   Specialty: Physician Assistant Why: 12/01/2020 at 1235 for post hospital follow up and discuss flutter ablation.  Contact information: [address] [place] [address] [phone]                 Duration of Discharge Encounter: Greater than 30 minutes including physician time.  Jacalyn Lefevre, PA-C  11/20/2020 12:01 PM  EP Attending  Patient seen and examined. He has had persistent atrial flutter but has been found to be covid positive. He will be discharged home to return in 3 weeks for catheter ablation. See my rounding note as well.  Including time spent earlier this morning, I spent 36 minutes reviewing the ablation procedure, discussing with Oda Kilts MD and planning to undergo outpatient ablation procedure.  Carleene Overlie Jestin Burbach,MD

## 2020-11-20 NOTE — Discharge Instructions (Signed)
Atrial Flutter  Atrial flutter is a type of abnormal heart rhythm (arrhythmia). The heart has an electrical system that tells it how to beat. In atrial flutter, the signals move rapidly in the top chambers of the heart (the atria). This makes your heart beat very fast. Atrial flutter can come and go, or it can be permanent. The goal of treatment is to prevent blood clots from forming, control your heart rate, or restore your heartbeat to a normal rhythm. If this condition is not treated, it can cause serious problems, such as a weakened heart muscle (cardiomyopathy) or a stroke. What are the causes? This condition is often caused by conditions that damage the heart's electrical system. These include:  Heart conditions and heart surgery. These include heart attacks and open-heart surgery.  Lung problems, such as COPD or a blood clot in the lung (pulmonary embolism, or PE).  Poorly controlled high blood pressure (hypertension).  Overactive thyroid (hyperthyroidism).  Diabetes. In some cases, the cause of this condition is not known. What increases the risk? You are more likely to develop this condition if:  You are an elderly adult.  You are a man.  You are overweight (obese).  You have obstructive sleep apnea.  You have a family history of atrial flutter.  You have diabetes.  You drink a lot of alcohol, especially binge drinking.  You use drugs, including cannabis.  You smoke. What are the signs or symptoms? Symptoms of this condition include:  A feeling that your heart is pounding or racing (palpitations).  Shortness of breath.  Chest pain.  Feeling dizzy or light-headed.  Fainting.  Low blood pressure (hypotension).  Fatigue.  Tiring easily during exercise or activity. In some cases, there are no symptoms. How is this diagnosed? This condition may be diagnosed with:  An electrocardiogram (ECG) to check electrical signals of the heart.  An ambulatory  cardiac monitor to record your heart's activity for a few days.  An echocardiogram to create pictures of your heart.  A transesophageal echocardiogram (TEE) to create even better pictures of your heart.  A stress test to check your blood supply while you exercise.  Imaging tests, such as a CT scan or chest X-ray.  Blood tests. How is this treated? Treatment depends on underlying conditions and how you feel when you experience atrial flutter. This condition may be treated with:  Medicines to prevent blood clots or to treat heart rate or heart rhythm problems.  Electrical cardioversion to reset the heart's rhythm.  Ablation to remove the heart tissue that sends abnormal signals.  Left atrial appendage closure to seal the area where blood clots can form. In some cases, underlying conditions will be treated. Follow these instructions at home: Medicines  Take over-the-counter and prescription medicines only as told by your health care provider.  Do not take any new medicines without talking to your health care provider.  If you are taking blood thinners: ? Talk with your health care provider before you take any medicines that contain aspirin or NSAIDs, such as ibuprofen. These medicines increase your risk for dangerous bleeding. ? Take your medicine exactly as told, at the same time every day. ? Avoid activities that could cause injury or bruising, and follow instructions about how to prevent falls. ? Wear a medical alert bracelet or carry a card that lists what medicines you take. Lifestyle  Eat heart-healthy foods. Talk with a dietitian to make an eating plan that is right for you.  Do  not use any products that contain nicotine or tobacco, such as cigarettes, e-cigarettes, and chewing tobacco. If you need help quitting, ask your health care provider.  Do not drink alcohol.  Do not use drugs, including cannabis.  Lose weight if you are overweight or obese.  Exercise  regularly as instructed by your health care provider. General instructions  Do not use diet pills unless your health care provider approves. Diet pills may make heart problems worse.  If you have obstructive sleep apnea, manage your condition as told by your health care provider.  Keep all follow-up visits as told by your health care provider. This is important. Contact a health care provider if you:  Notice a change in the rate, rhythm, or strength of your heartbeat.  Are taking a blood thinner and you notice more bruising.  Have a sudden change in weight.  Tire more easily when you exercise or do heavy work. Get help right away if you have:  Pain or pressure in your chest.  Shortness of breath.  Fainting.  Increasing sweating with no known cause.  Side effects of blood thinners, such as blood in your vomit, stool, or urine, or bleeding that cannot stop.  Any symptoms of a stroke. "BE FAST" is an easy way to remember the main warning signs of a stroke: ? B - Balance. Signs are dizziness, sudden trouble walking, or loss of balance. ? E - Eyes. Signs are trouble seeing or a sudden change in vision. ? F - Face. Signs are sudden weakness or numbness of the face, or the face or eyelid drooping on one side. ? A - Arms. Signs are weakness or numbness in an arm. This happens suddenly and usually on one side of the body. ? S - Speech. Signs are sudden trouble speaking, slurred speech, or trouble understanding what people say. ? T - Time. Time to call emergency services. Write down what time symptoms started.  Other signs of a stroke, such as: ? A sudden, severe headache with no known cause. ? Nausea or vomiting. ? Seizure.  These symptoms may represent a serious problem that is an emergency. Do not wait to see if the symptoms will go away. Get medical help right away. Call your local emergency services (911 in the [place] Do not drive yourself to the hospital. Summary  Atrial  flutter is an abnormal heart rhythm that can give you symptoms of palpitations, shortness of breath, or fatigue.  Atrial flutter is often treated with medicines to keep your heart in a normal rhythm and to prevent a stroke.  Get help right away if you cannot catch your breath, or have chest pain or pressure.  Get help right away if you have signs or symptoms of a stroke. This information is not intended to replace advice given to you by your health care provider. Make sure you discuss any questions you have with your health care provider. Document Revised: 12/30/2018 Document Reviewed: 12/30/2018 Elsevier Patient Education  2021 [place]. COVID-19: What Your Test Results Mean If you test positive for COVID-19 Take steps to protect others regardless of your COVID-19 vaccination status Stay home.  Isolate at home for at least 10 days. Stay in a specific room and away from other people in your home. Get rest and stay hydrated. If you develop symptoms, continue to isolate for at least 10 days after symptoms began and until you do not have a fever without using medications to reduce fever. Stay in touch  with your doctor. Contact your doctor as soon as possible if you are an older adult or have underlying medical conditions. Contact your doctor or health department about isolation if you  Are severely ill or have a weakened immune system.  Had a positive test result followed by a negative result.  Test positive for many weeks. If you test negative for COVID-19:  The virus was not detected. If you have symptoms of COVID-19:  You may have received a false negative test result and still might have COVID-19.  Isolate from others. If you do not have symptoms of COVID-19 and you were exposed to a person with COVID-19:  You are likely not infected, but you still may get sick.  Contact your doctor about your symptoms, about follow-up testing, and how long to isolate.  Self-quarantine  for 14 days at home after your exposure.  If you are fully vaccinated, you do not need to self quarantine.  Contact your doctor or local health department regarding options to reduce the length of your quarantine. A negative test result does not mean you won't get sick later. [email or web address] 04/18/2020 This information is not intended to replace advice given to you by your health care provider. Make sure you discuss any questions you have with your health care provider. Document Revised: 05/22/2020 Document Reviewed: 05/22/2020 Elsevier Patient Education  2021 [place] vs. Isolation QUARANTINE keeps someone who was in close contact with someone who has COVID-19 away from others. Quarantine if you have been in close contact with someone who has COVID-19, unless you have been fully vaccinated. If you are fully vaccinated  You do NOT need to quarantine unless they have symptoms  Get tested 3-5 days after your exposure, even if you don't have symptoms  Wear a mask indoors in public for 14 days following exposure or until your test result is negative If you are not fully vaccinated  Stay home for 14 days after your last contact with a person who has COVID-19  Watch for fever (100.37F), cough, shortness of breath, or other symptoms of COVID-19  If possible, stay away from people you live with, especially people who are at higher risk for getting very sick from COVID-19  Contact your local public health department for options in your area to possibly shorten your quarantine ISOLATION keeps someone who is sick or tested positive for COVID-19 without symptoms away from others, even in their own home. People who are in isolation should stay home and stay in a specific "sick room" or area and use a separate bathroom (if available). If you are sick and think or know you have COVID-19 Stay home until after  At least 10 days since symptoms first appeared and  At  least 24 hours with no fever without the use of fever-reducing medications and  Symptoms have improved If you tested positive for COVID-19 but do not have symptoms  Stay home until after 10 days have passed since your positive viral test  If you develop symptoms after testing positive, follow the steps above for those who are sick [email or web address] 04/17/2020 This information is not intended to replace advice given to you by your health care provider. Make sure you discuss any questions you have with your health care provider. Document Revised: 05/22/2020 Document Reviewed: 05/22/2020 Elsevier Patient Education  2021 [place].  Please call our [place] hotline at [phone] if you develop worsening COVID symptoms such as worsening SOB, fever, fatigue, aches, or chills.

## 2020-11-21 ENCOUNTER — Telehealth: Payer: Self-pay

## 2020-11-21 NOTE — Telephone Encounter (Signed)
Received TC from [place], nurse with [facility] who states patient is currently being seen 1X/week for skilled nursing and 1X/week for care calls.  She states patient went to ED this past weekend for increased pulse rate and tested positive for Covid.  VS 108/72, P98, O2 Sats are 100% on 2L of O2.  She will do another visit before Friday.   Patient has a f/u on 11/27/20 with Dr. Sherry Ruffing, he won't be out of quarantine until 12/01/20.  This RN changed the appt to a telehealth appt.  [facility] nurse will inform patient, will also ask [facility] front desk to reach out to patient.  Will forward to red team to make them aware pt has Covid. Thank you, SChaplin, RN,BSN

## 2020-11-22 ENCOUNTER — Telehealth: Payer: Self-pay | Admitting: Hematology and Oncology

## 2020-11-22 NOTE — Telephone Encounter (Signed)
Received a new hem referral from Dr. Jimmye Norman for pe. [name] returned my call and has been scheduled to see Dr. Chryl Heck on 5/16 at 11:20am. Pt aware to arrive 20 minutes early.

## 2020-11-22 NOTE — Telephone Encounter (Signed)
TC to patient, he was made aware that the 5/9 appointment with Dr. Sherry Ruffing was now telehealth d/t +Covid and quarantine guidelines.  He states he is feeling better, denies any needs.  States [place] contacted him and will visit today at 1630.  He was instructed to call clinic for any questions or concerns, he verbalized understanding. SChaplin, RN,BSN

## 2020-11-23 ENCOUNTER — Encounter (HOSPITAL_COMMUNITY): Payer: Medicare Other

## 2020-11-27 ENCOUNTER — Ambulatory Visit (INDEPENDENT_AMBULATORY_CARE_PROVIDER_SITE_OTHER): Payer: Medicare Other | Admitting: Internal Medicine

## 2020-11-27 ENCOUNTER — Other Ambulatory Visit: Payer: Self-pay

## 2020-11-27 ENCOUNTER — Telehealth: Payer: Self-pay

## 2020-11-27 DIAGNOSIS — D649 Anemia, unspecified: Secondary | ICD-10-CM

## 2020-11-27 DIAGNOSIS — R0902 Hypoxemia: Secondary | ICD-10-CM

## 2020-11-27 DIAGNOSIS — I483 Typical atrial flutter: Secondary | ICD-10-CM | POA: Diagnosis not present

## 2020-11-27 NOTE — Telephone Encounter (Signed)
-----   Message from Shirley Friar, PA-C sent at 11/20/2020 10:52 AM EDT ----- In patient who was on for Dr. Lovena Le today but tested positive for COVID. He would like to do Atrial flutter ablation as outpatient in approx 3 weeks.   Legrand Como [address] [place], PA-C  11/20/2020 10:52 AM
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
## 2020-11-28 ENCOUNTER — Telehealth: Payer: Self-pay

## 2020-11-28 DIAGNOSIS — R0902 Hypoxemia: Secondary | ICD-10-CM | POA: Insufficient documentation

## 2020-11-28 NOTE — Telephone Encounter (Signed)
RN for [facility] requesting a call back  With updates about pt

## 2020-11-28 NOTE — Progress Notes (Signed)
Internal Medicine Clinic Attending ° °Case discussed with Dr. Krienke  At the time of the visit.  We reviewed the resident’s history and exam and pertinent patient test results.  I agree with the assessment, diagnosis, and plan of care documented in the resident’s note.  °

## 2020-11-28 NOTE — Assessment & Plan Note (Signed)
Patient has a history of normocytic anemia, hemoglobin on discharge was 10.4.  Does not appear that he has had colon cancer screening done recently.  We will need to discuss this on his follow-up visit. -RTC in 1-2 weeks -Repeat CBC at that time -Discuss colon cancer screening

## 2020-11-28 NOTE — Telephone Encounter (Signed)
Spoke with the patient. Appt has been sch for 12/05/2020 with Dr. Sherry Ruffing.

## 2020-11-28 NOTE — Telephone Encounter (Signed)
RTC, [facility] nurse, Jacob Moores, asking if MD is aware that patient was placed on 2 new meds from ED, bisoprolol and amiodarone.  RN informed [facility] nurse that patient had a ED fu telehealth visit yesterday w/ Dr. Sherry Ruffing and both meds were on the med list, so MD was aware. SChaplin, RN,BSN

## 2020-11-28 NOTE — Assessment & Plan Note (Signed)
Patient is currently on 2 L nasal cannula which he was discharged on when he left the hospital after admission for new onset atrial flutter.  Tested positive for COVID when he was seen on 4/29 for recurrence atrial flutter, unlikely that this is causing his hypoxia.  He denies any shortness of breath, chest pain, cough, wheezing, or palpitations today.  He has a 45-pack-year smoking history. 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 This could be related to his cancer history, recent new onset atrial flutter, or his COPD. Will have patient RTC to repeat O2 saturation off oxygen to see if he still requires supplemental O2.   -RTC in 1-2 weeks for O2 check -Continue 2L [place]

## 2020-11-28 NOTE — Assessment & Plan Note (Signed)
Today he reports that he is feeling okay today. He reports that his breathing okay. He is still on the 2 L [place], he reports that he some times does not use it. He reports that his oxygen saturation is around 98-99% when he checks it. Denies any chest pain, palpitations, fevers, chills, nausea, vomiting, or other symptoms. Hasn't been using albuterol. He has been able to do yard work. [facility] nurse has been coming around to help. He is currently on his Alen Blew and his amiodarone. He checked his vitals today, BP was 123/80, with a pulse of 81.   -Continue amiodarone and Xeralto -F/u with cardiology

## 2020-12-01 ENCOUNTER — Other Ambulatory Visit: Payer: Self-pay

## 2020-12-01 ENCOUNTER — Ambulatory Visit (INDEPENDENT_AMBULATORY_CARE_PROVIDER_SITE_OTHER): Payer: Medicare Other | Admitting: Student

## 2020-12-01 ENCOUNTER — Encounter: Payer: Self-pay | Admitting: Student

## 2020-12-01 VITALS — BP 120/72 | HR 107 | Ht 67.0 in | Wt 151.4 lb

## 2020-12-01 DIAGNOSIS — I483 Typical atrial flutter: Secondary | ICD-10-CM | POA: Diagnosis not present
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
## 2020-12-01 NOTE — Patient Instructions (Addendum)
Medication Instructions:  Your physician has recommended you make the following change in your medication:   STOP: Amiodarone  *If you need a refill on your cardiac medications before your next appointment, please call your pharmacy*   Lab Work: None If you have labs (blood work) drawn today and your tests are completely normal, you will receive your results only by: Marland Kitchen MyChart Message (if you have MyChart) OR . A paper copy in the mail If you have any lab test that is abnormal or we need to change your treatment, we will call you to review the results.   Follow-Up: At [facility], you and your health needs are our priority.  As part of our continuing mission to provide you with exceptional heart care, we have created designated Provider Care Teams.  These Care Teams include your primary Cardiologist (physician) and Advanced Practice Providers (APPs -  Physician Assistants and Nurse Practitioners) who all work together to provide you with the care you need, when you need it.  We recommend signing up for the patient portal called "MyChart".  Sign up information is provided on this After Visit Summary.  MyChart is used to connect with patients for Virtual Visits (Telemedicine).  Patients are able to view lab/test results, encounter notes, upcoming appointments, etc.  Non-urgent messages can be sent to your provider as well.   To learn more about what you can do with MyChart, go to [email or web address].    Your next appointment:   As scheduled   Other Instructions                      Electrophysiology/Ablation Procedure Instructions   You are scheduled for a(n) A-Flutter ablation on December 22, 2020 with Dr. Lovena Le.   1.   Pre procedure testing-             A.    Labs- You will come to the [facility] office on ___5/31/2022_____ any time between 8:00 am and 4:30 pm.  You do NOT need to be fasting.    2.  PROCEDURE DAY:  On the day of your procedure 12/22/2020 at 5:30am you will go to  [facility] ([address]).  You will go to the main entrance A [employer]) and enter where the [employer] parking staff are.  You will check in at ADMITTING.  You may have one support person come into the hospital with you.  They will be asked to wait in the waiting room.  It is OK to have someone drop you off and come back when you are ready to be discharged.     3.   Do not eat or drink after midnight prior to your procedure.     4.   On the morning of your procedure do NOT take any medication.    Do not miss any doses of your blood thinner prior to the morning of your procedure or your procedure will need to be rescheduled.   5.  Plan for an overnight stay but you may be discharged after your procedure.  If you are discharged after your procedure you will need someone to drive you home and be with your for 24 hours after your procedure.   6.  You will follow up with the Oda Kilts, PA-C 4 weeks after your procedure. You will follow up with Dr.Taylor 3 months after your procedure.   * If you have ANY questions after you get home, please call  the office [phone] and ask for Bing Neighbors or send a [employer].

## 2020-12-03 ENCOUNTER — Encounter: Payer: Self-pay | Admitting: *Deleted

## 2020-12-03 NOTE — Progress Notes (Signed)

## 2020-12-04 ENCOUNTER — Other Ambulatory Visit: Payer: Self-pay

## 2020-12-04 ENCOUNTER — Inpatient Hospital Stay: Payer: Medicare Other | Attending: Hematology and Oncology | Admitting: Hematology and Oncology

## 2020-12-04 ENCOUNTER — Encounter: Payer: Self-pay | Admitting: *Deleted

## 2020-12-04 ENCOUNTER — Encounter: Payer: Self-pay | Admitting: Hematology and Oncology

## 2020-12-04 ENCOUNTER — Inpatient Hospital Stay: Payer: Medicare Other

## 2020-12-04 VITALS — BP 115/73 | HR 91 | Temp 97.4°F | Resp 17 | Wt 150.4 lb

## 2020-12-04 DIAGNOSIS — Z7901 Long term (current) use of anticoagulants: Secondary | ICD-10-CM | POA: Diagnosis not present

## 2020-12-04 DIAGNOSIS — Z87891 Personal history of nicotine dependence: Secondary | ICD-10-CM | POA: Diagnosis not present

## 2020-12-04 DIAGNOSIS — Z791 Long term (current) use of non-steroidal anti-inflammatories (NSAID): Secondary | ICD-10-CM | POA: Diagnosis not present

## 2020-12-04 DIAGNOSIS — I4892 Unspecified atrial flutter: Secondary | ICD-10-CM | POA: Insufficient documentation

## 2020-12-04 DIAGNOSIS — C3412 Malignant neoplasm of upper lobe, left bronchus or lung: Secondary | ICD-10-CM | POA: Diagnosis not present

## 2020-12-04 DIAGNOSIS — Z8616 Personal history of COVID-19: Secondary | ICD-10-CM | POA: Insufficient documentation

## 2020-12-04 DIAGNOSIS — C3490 Malignant neoplasm of unspecified part of unspecified bronchus or lung: Secondary | ICD-10-CM | POA: Diagnosis not present

## 2020-12-04 DIAGNOSIS — H35039 Hypertensive retinopathy, unspecified eye: Secondary | ICD-10-CM | POA: Insufficient documentation

## 2020-12-04 DIAGNOSIS — Z79899 Other long term (current) drug therapy: Secondary | ICD-10-CM | POA: Insufficient documentation

## 2020-12-04 DIAGNOSIS — Z923 Personal history of irradiation: Secondary | ICD-10-CM | POA: Diagnosis not present

## 2020-12-04 DIAGNOSIS — I2699 Other pulmonary embolism without acute cor pulmonale: Secondary | ICD-10-CM | POA: Diagnosis not present

## 2020-12-04 NOTE — Progress Notes (Signed)
Per Dr. Chryl Heck, patient does not need heme oncology at this time.  Patient does have a follow up with rad onc due to hx lung cancer.

## 2020-12-04 NOTE — Progress Notes (Signed)
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 Date/Time: 11/13/2020/11:49:02 AM    Final     All questions were answered. The patient knows to call the clinic with any problems, questions or concerns. I spent 45 minutes in the care of this patient including H and P, review of records, counseling and coordination of care. I have reviewed his images for CT PE protocol, pertinent records including his previous cytology from left upper lobe, discussed recommendations.    Benay Pike, MD 12/04/2020 11:53 AM

## 2020-12-05 ENCOUNTER — Ambulatory Visit (INDEPENDENT_AMBULATORY_CARE_PROVIDER_SITE_OTHER): Payer: Medicare Other | Admitting: Internal Medicine

## 2020-12-05 ENCOUNTER — Encounter: Payer: Self-pay | Admitting: Internal Medicine

## 2020-12-05 ENCOUNTER — Encounter: Payer: Self-pay | Admitting: General Practice

## 2020-12-05 ENCOUNTER — Telehealth (HOSPITAL_COMMUNITY): Payer: Self-pay | Admitting: General Practice

## 2020-12-05 DIAGNOSIS — M1A9XX0 Chronic gout, unspecified, without tophus (tophi): Secondary | ICD-10-CM

## 2020-12-05 DIAGNOSIS — R0902 Hypoxemia: Secondary | ICD-10-CM

## 2020-12-05 MED ORDER — DICLOFENAC SODIUM 1 % EX GEL: 2.0000 g | Freq: Four times a day (QID) | CUTANEOUS | 1 refills | Status: DC

## 2020-12-05 NOTE — Patient Instructions (Addendum)
Jason Carter,  It was a pleasure to see you today. Thank you for coming in.   Today we discussed your oxygen requirements and recent hospitalization. I am glad that you are feeling well right now. We checked your oxygen level and it looks like you still need oxygen when you are active. Please follow up with [place] after you have the ablation for your atrial fibrillation.   We also discussed hand pain. Please continue using the volteran gel on the area. I have sent in a new refill for this medication.   Please return to clinic in 2 months or sooner if needed.   Thank you again for coming in.   Asencion Noble.D.

## 2020-12-05 NOTE — Progress Notes (Signed)
SATURATION QUALIFICATIONS: (This note is used to comply with regulatory documentation for home oxygen)  Patient Saturations on Room Air at Rest = 94%  Patient Saturations on Room Air while Ambulating = 86%  Patient Saturations on 2 Liters of oxygen while Ambulating =96 %  Please briefly explain why patient needs home oxygen: See MD note for explaination

## 2020-12-05 NOTE — Progress Notes (Signed)
Things That May Be Affecting Your Health:  Alcohol  Hearing loss  Pain    Depression  Home Safety  Sexual Health   Diabetes  Lack of physical activity  Stress   Difficulty with daily activities  Loneliness x Tiredness   Drug use x Medicines x Tobacco use   Falls  Motor Vehicle Safety  Weight   Food choices  Oral Health  Other    YOUR PERSONALIZED HEALTH PLAN : 1. Schedule your next subsequent Medicare Wellness visit in one year 2. Attend all of your regular appointments to address your medical issues 3. Complete the preventative screenings and services   Annual Wellness Visit   Medicare Covered Preventative Screenings and [place] Men and Women Who How Often Need? Date of Last Service Action  Abdominal Aortic Aneurysm Adults with AAA risk factors Once      Alcohol Misuse and Counseling All Adults Screening once a year if no alcohol misuse. Counseling up to 4 face to face sessions.     Bone Density Measurement  Adults at risk for osteoporosis Once every 2 yrs      Lipid Panel Z13.6 All adults without CV disease Once every 5 yrs       Colorectal Cancer   Stool sample or  Colonoscopy All adults 16 and older   Once every year  Every 10 years x       Depression All Adults Once a year  Today   Diabetes Screening Blood glucose, post glucose load, or GTT Z13.1  All adults at risk  Pre-diabetics  Once per year  Twice per year      Diabetes  Self-Management Training All adults Diabetics 10 hrs first year; 2 hours subsequent years. Requires Copay     Glaucoma  Diabetics  Family history of glaucoma  African Americans 63 yrs +  Hispanic Americans 18 yrs + Annually - requires coppay      Hepatitis C Z72.89 or F19.20  High Risk for HCV  Born between 1945 and 1965  Annually  Once      HIV Z11.4 All adults based on risk  Annually btw ages 101 & 63 regardless of risk  Annually > 65 yrs if at increased risk      Lung Cancer Screening  Asymptomatic adults aged 82-77 with 30 pack yr history and current smoker OR quit within the last 15 yrs Annually Must have counseling and shared decision making documentation before first screen      Medical Nutrition Therapy Adults with   Diabetes  Renal disease  Kidney transplant within past 3 yrs 3 hours first year; 2 hours subsequent years     Obesity and Counseling All adults Screening once a year Counseling if BMI 30 or higher  Today   Tobacco Use Counseling Adults who use tobacco  Up to 8 visits in one year x    Vaccines Z23  Hepatitis B  Influenza   Pneumonia  Adults   Once  Once every flu season  Two different vaccines separated by one year     Next Annual Wellness Visit People with Medicare Every year  Today     Services & Screenings Women Who How Often Need  Date of Last Service Action  Mammogram  Z12.31 Women over 64 One baseline ages 60-39. Annually ager 40 yrs+      Pap tests All women Annually if high risk. Every 2 yrs for normal risk women  Screening for cervical cancer with   Pap (Z01.419 nl or Z01.411abnl) &  HPV Z11.51 Women aged 42 to 46 Once every 5 yrs     Screening pelvic and breast exams All women Annually if high risk. Every 2 yrs for normal risk women     Sexually Transmitted Diseases  Chlamydia  Gonorrhea  Syphilis All at risk adults Annually for non pregnant females at increased risk         [place] Men Who How Ofter Need  Date of Last Service Action  Prostate Cancer - DRE & PSA Men over 50 Annually.  DRE might require a copay.        Sexually Transmitted Diseases  Syphilis All at risk adults Annually for men at increased risk      Health Maintenance List Health Maintenance  Topic Date Due  . COLONOSCOPY (Pts 45-44yrs Insurance coverage will need to be confirmed)  Never done  . COVID-19 Vaccine (4 - Booster for [place] series) 08/12/2020  . INFLUENZA VACCINE  02/19/2021  . TETANUS/TDAP  02/12/2027   . Hepatitis C Screening  Completed  . PNA vac Low Risk Adult  Completed  . HPV VACCINES  Aged Out

## 2020-12-05 NOTE — Telephone Encounter (Signed)
[place] CSW Progress Notes  Call to patient per referral from Dr Chryl Heck.  Patient on way to another appointment, does not have time to speak, will need to follow up w him at another time.  Edwyna Shell, LCSW Clinical Social Worker Phone:  [phone]
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
## 2020-12-06 ENCOUNTER — Encounter: Payer: Self-pay | Admitting: General Practice

## 2020-12-06 NOTE — Assessment & Plan Note (Signed)
Today he reports that he is feeling okay, he denies any chest pain, shortness of breath, palpitations, lightheadedness, dizziness, fatigue, weakness.  He has been using his 2 L nasal cannula consistently, except when he gets up to go to the bathroom or takes a shower.  He denies any issues with this.  He has a follow-up with cardiology later this month and has a scheduled ablation in early June.  Initially presented saturating 100% on 2 L nasal cannula, patient was ambulated in had a drop down to 86% improved to 96% on 2 L.  Patient has multiple things that could be contributing to his hypoxia including atrial flutter, emphysema, malignant neoplasm of left upper lobe, and history of tobacco use.  Patient does still require oxygen, could consider rechecking oxygen requirements after ablation. -Continue supplemental 2 L oxygen -Follow-up with cardiology

## 2020-12-06 NOTE — Progress Notes (Signed)
[place] Psychosocial Distress Screening Clinical Social Work  Clinical Social Work was referred by distress screening protocol.  The patient scored a 4 on the Psychosocial Distress Thermometer which indicates mild distress. Clinical Social Worker contacted patient by phone to assess for distress and other psychosocial needs. Second call to patient to address distress screen, left detailed VM w our contact information and brief summary of services available through [facility].  Encouraged him to call back if he desires.  ONCBCN DISTRESS SCREENING 12/04/2020  Screening Type Initial Screening  Distress experienced in past week (1-10) 4  Practical problem type Food  Emotional problem type Nervousness/Anxiety  Spiritual/Religous concerns type Facing my mortality  Information Concerns Type Lack of info about maintaining fitness  Physical Problem type Getting around  Physician notified of physical symptoms Yes  Referral to clinical social work Yes    Clinical Social Worker follow up needed: No.  If yes, follow up plan:  Beverely Pace, [place], LCSW Clinical Social Worker Phone:  [phone]

## 2020-12-06 NOTE — Assessment & Plan Note (Signed)
Patient reports that his right wrist has been proving, he started having right wrist pain in January of this year, had x-rays done that showed a small lunacy in the distal ulnar aspect that was concerning for metastasis, we spoke with his oncologist who did not feel that this imaging was consistent with metastasis.  He reports that his symptoms had resolved however a few days ago he started having some worsening of his pain, started having some swelling on the dorsal aspect.  He has been using Voltaren gel on the area and yesterday he states that the pain and swelling has gotten better.  On exam he does have some warmth and swelling noted on the dorsal aspect of his right wrist, as well as a large nodule on the dorsal ulnar area, strength and sensation intact.  Patient does have a history of chronic gout and had previously been on allopurinol, his uric acid about 6 months ago and was 5 so this was held.  Since this has been improving with Voltaren gel discussed that we can continue this, if his symptoms worsen or do not improve could consider arthrocentesis, would need to refer her to orthopedics to have this done.  -Continue Voltaren gel

## 2020-12-08 NOTE — Progress Notes (Signed)
Internal Medicine Clinic Attending ° °Case discussed with Dr. Krienke  At the time of the visit.  We reviewed the resident’s history and exam and pertinent patient test results.  I agree with the assessment, diagnosis, and plan of care documented in the resident’s note.  °

## 2020-12-11 ENCOUNTER — Encounter: Payer: Self-pay | Admitting: *Deleted

## 2020-12-11 ENCOUNTER — Telehealth: Payer: Self-pay | Admitting: Internal Medicine

## 2020-12-11 MED ORDER — RIVAROXABAN 20 MG PO TABS: 20.0000 mg | ORAL_TABLET | Freq: Every day | ORAL | 5 refills | Status: DC

## 2020-12-11 NOTE — Telephone Encounter (Signed)
*  STAT* If patient is at the pharmacy, call can be transferred to refill team.   1. Which medications need to be refilled? (please list name of each medication and dose if known) XARELTO  2. Which pharmacy/location (including street and city if local pharmacy) is medication to be sent to? [id number] [address] [place]  3. Do they need a 30 day or 90 day supply? [place] THAT HE WILL BE OUT OF HIS RX XARELTO BY Friday NEEDS REFILL

## 2020-12-11 NOTE — Telephone Encounter (Signed)
Xarelto 20mg  refill request received. Pt is 75 years old, weight-68.8kg, Crea-0.66 on 5/1/222, last seen by Salley PA on 11/19/20, Diagnosis-Aflutter, CrCl-94.51ml/min; Dose is appropriate based on dosing criteria. Will send in refill to requested pharmacy.

## 2020-12-11 NOTE — Progress Notes (Signed)
[place] Work  Clinical Social Work received return call from patient.  CSW had attempted to contact patient after a referral form food insecurities.  Patient returned call to clarify his food concerns were diet and not access to food.  Patient stated he is now on a "heart health diet", "low sodium".  Patient stated he was interested in information and resources to help with heart health meal planning.  Patient stated he has spoken with his home health nurse and she plans to bring more resources at her next visit.  CSW also encouraged patient to request information at his next appointment with cardiology.  Patient did not express any additional concerns and was appreciative of contact.  Johnnye Lana, MSW, LCSW, OSW-C Clinical Social Worker [place] [phone]

## 2020-12-14 ENCOUNTER — Other Ambulatory Visit (HOSPITAL_COMMUNITY): Payer: Self-pay

## 2020-12-15 ENCOUNTER — Other Ambulatory Visit: Payer: Self-pay

## 2020-12-15 ENCOUNTER — Other Ambulatory Visit: Payer: Medicare Other | Admitting: *Deleted

## 2020-12-15 ENCOUNTER — Telehealth: Payer: Self-pay | Admitting: Cardiology

## 2020-12-15 DIAGNOSIS — I483 Typical atrial flutter: Secondary | ICD-10-CM | POA: Diagnosis not present

## 2020-12-15 NOTE — Telephone Encounter (Signed)
Follow up:   Patient returning a call back.

## 2020-12-15 NOTE — Telephone Encounter (Signed)
Returned call to Pt.  Advised to come to [facility] office to get pre procedure labs and pick up Xarelto samples.  Pt states he is on his way.

## 2020-12-15 NOTE — Telephone Encounter (Signed)
This RN returned patient's call, call went to voice mail, this RN left non-specific message for patient to return call.

## 2020-12-15 NOTE — Telephone Encounter (Signed)
Patient call in with question about the rivaroxaban (XARELTO) 20 MG TABS tablet and the cost of it being that he has procedure that is being done on the June 3rd. He saying the cost of the prescription is lot being that he has to stop taking the blood thinner for the procedure. Please advise

## 2020-12-15 NOTE — Telephone Encounter (Signed)
This RN contacted patient , patient reports he is almost out of Xarelto and has a refill waiting at the pharmacy but that the 30 day medication is about $400.  The patient is concerned that he will get the 30 day supply and then be told to discontinue the medication following his a-flutter ablation on June 3, wasting very expensive medication. This RN did not see any specific instructions regarding what will be done with the patient's Xarelto dose following the procedure.Other than the prescription at the pharmacy, the patient only has 1 remaining dose of Xarelto, which he will take tonight.  This RN told patient Dr. Tanna Furry nurse would be contacted to advise about the Xarelto for assistance.

## 2020-12-16 LAB — BASIC METABOLIC PANEL
BUN/Creatinine Ratio: 29 — ABNORMAL HIGH (ref 10–24)
BUN: 22 mg/dL (ref 8–27)
CO2: 27 mmol/L (ref 20–29)
Calcium: 9.1 mg/dL (ref 8.6–10.2)
Chloride: 101 mmol/L (ref 96–106)
Creatinine, Ser: 0.76 mg/dL (ref 0.76–1.27)
Glucose: 96 mg/dL (ref 65–99)
Potassium: 4.6 mmol/L (ref 3.5–5.2)
Sodium: 142 mmol/L (ref 134–144)
eGFR: 94 mL/min/{1.73_m2} (ref >59)

## 2020-12-16 LAB — CBC
Hematocrit: 37.3 % — ABNORMAL LOW (ref 37.5–51.0)
Hemoglobin: 11.6 g/dL — ABNORMAL LOW (ref 13.0–17.7)
MCH: 27.8 pg (ref 26.6–33.0)
MCHC: 31.1 g/dL — ABNORMAL LOW (ref 31.5–35.7)
MCV: 89 fL (ref 79–97)
Platelets: 158 10*3/uL (ref 150–450)
RBC: 4.18 x10E6/uL (ref 4.14–5.80)
RDW: 12.9 % (ref 11.6–15.4)
WBC: 7.5 10*3/uL (ref 3.4–10.8)

## 2020-12-19 ENCOUNTER — Other Ambulatory Visit: Payer: Medicare Other

## 2020-12-21 NOTE — Pre-Procedure Instructions (Signed)
Attempted to call patient regarding procedure instructions for tomorrow.  Left voice mail on the following items: Arrival time 0530 Nothing to eat or drink after midnight No meds AM of procedure Responsible person to drive you home and stay with you for 24 hrs  Have you missed any doses of anti-coagulant Eliquis- take today's doses, none in the morning

## 2020-12-22 ENCOUNTER — Encounter (HOSPITAL_COMMUNITY)
Admission: RE | Disposition: A | Discharge status: Home / Self Care | Payer: Self-pay | Source: Home / Self Care | Attending: Internal Medicine

## 2020-12-22 ENCOUNTER — Ambulatory Visit (HOSPITAL_COMMUNITY)
Admission: RE | Admit: 2020-12-22 | Discharge: 2020-12-22 | Disposition: A | Discharge status: Home / Self Care | Payer: Medicare Other | Attending: Internal Medicine | Admitting: Internal Medicine

## 2020-12-22 ENCOUNTER — Other Ambulatory Visit: Payer: Self-pay

## 2020-12-22 DIAGNOSIS — Z8639 Personal history of other endocrine, nutritional and metabolic disease: Secondary | ICD-10-CM | POA: Insufficient documentation

## 2020-12-22 DIAGNOSIS — I483 Typical atrial flutter: Secondary | ICD-10-CM | POA: Diagnosis not present

## 2020-12-22 DIAGNOSIS — Z79899 Other long term (current) drug therapy: Secondary | ICD-10-CM | POA: Insufficient documentation

## 2020-12-22 DIAGNOSIS — Z8249 Family history of ischemic heart disease and other diseases of the circulatory system: Secondary | ICD-10-CM | POA: Diagnosis not present

## 2020-12-22 DIAGNOSIS — Z85118 Personal history of other malignant neoplasm of bronchus and lung: Secondary | ICD-10-CM | POA: Insufficient documentation

## 2020-12-22 DIAGNOSIS — I4892 Unspecified atrial flutter: Secondary | ICD-10-CM | POA: Diagnosis present

## 2020-12-22 DIAGNOSIS — Z9981 Dependence on supplemental oxygen: Secondary | ICD-10-CM | POA: Insufficient documentation

## 2020-12-22 DIAGNOSIS — Z7901 Long term (current) use of anticoagulants: Secondary | ICD-10-CM | POA: Insufficient documentation

## 2020-12-22 DIAGNOSIS — Z87891 Personal history of nicotine dependence: Secondary | ICD-10-CM | POA: Diagnosis not present

## 2020-12-22 DIAGNOSIS — H35033 Hypertensive retinopathy, bilateral: Secondary | ICD-10-CM | POA: Insufficient documentation

## 2020-12-22 DIAGNOSIS — I4891 Unspecified atrial fibrillation: Secondary | ICD-10-CM | POA: Diagnosis not present

## 2020-12-22 HISTORY — PX: A-FLUTTER ABLATION: EP1230

## 2020-12-22 SURGERY — A-FLUTTER ABLATION

## 2020-12-22 MED ORDER — HEPARIN SODIUM (PORCINE) 1000 UNIT/ML IJ SOLN
INTRAMUSCULAR | Status: AC
  Filled 2020-12-22: qty 1

## 2020-12-22 MED ORDER — HEPARIN SODIUM (PORCINE) 1000 UNIT/ML IJ SOLN
INTRAMUSCULAR | Status: DC | PRN
  Administered 2020-12-22: 1000 [IU] via INTRAVENOUS

## 2020-12-22 MED ORDER — MIDAZOLAM HCL 5 MG/5ML IJ SOLN
INTRAMUSCULAR | Status: AC
  Filled 2020-12-22: qty 5

## 2020-12-22 MED ORDER — HEPARIN (PORCINE) IN NACL 1000-0.9 UT/500ML-% IV SOLN
INTRAVENOUS | Status: AC
  Filled 2020-12-22: qty 1000

## 2020-12-22 MED ORDER — ONDANSETRON HCL 4 MG/2ML IJ SOLN: 4.0000 mg | Freq: Four times a day (QID) | INTRAMUSCULAR | Status: DC | PRN

## 2020-12-22 MED ORDER — FENTANYL CITRATE (PF) 100 MCG/2ML IJ SOLN
INTRAMUSCULAR | Status: AC
  Filled 2020-12-22: qty 2

## 2020-12-22 MED ORDER — SODIUM CHLORIDE 0.9% FLUSH: 3.0000 mL | INTRAVENOUS | Status: DC | PRN

## 2020-12-22 MED ORDER — LIDOCAINE HCL (PF) 1 % IJ SOLN: INTRAMUSCULAR | Status: DC | PRN

## 2020-12-22 MED ORDER — ACETAMINOPHEN 325 MG PO TABS: 650.0000 mg | ORAL_TABLET | ORAL | Status: DC | PRN

## 2020-12-22 MED ORDER — BUPIVACAINE HCL (PF) 0.25 % IJ SOLN
INTRAMUSCULAR | Status: DC | PRN
  Administered 2020-12-22: 90 mL

## 2020-12-22 MED ORDER — MIDAZOLAM HCL 5 MG/5ML IJ SOLN
INTRAMUSCULAR | Status: DC | PRN
  Administered 2020-12-22 (×2): 2 mg via INTRAVENOUS
  Administered 2020-12-22 (×2): 1 mg via INTRAVENOUS

## 2020-12-22 MED ORDER — BUPIVACAINE HCL (PF) 0.25 % IJ SOLN
INTRAMUSCULAR | Status: AC
  Filled 2020-12-22: qty 30

## 2020-12-22 MED ORDER — FENTANYL CITRATE (PF) 100 MCG/2ML IJ SOLN
INTRAMUSCULAR | Status: DC | PRN
  Administered 2020-12-22 (×4): 25 ug via INTRAVENOUS

## 2020-12-22 MED ORDER — SODIUM CHLORIDE 0.9 % IV SOLN: INTRAVENOUS | Status: DC

## 2020-12-22 MED ORDER — HEPARIN (PORCINE) IN NACL 1000-0.9 UT/500ML-% IV SOLN
INTRAVENOUS | Status: DC | PRN
  Administered 2020-12-22 (×2): 500 mL

## 2020-12-22 MED ORDER — SODIUM CHLORIDE 0.9 % IV SOLN: 250.0000 mL | INTRAVENOUS | Status: DC | PRN

## 2020-12-22 SURGICAL SUPPLY — 11 items
CATH JOSEPH QUAD ALLRED 6F REP (CATHETERS) ×1 IMPLANT
CATH SMTCH THERMOCOOL SF FJ (CATHETERS) ×1 IMPLANT
CATH WEBSTER BI DIR CS D-F CRV (CATHETERS) ×1 IMPLANT
MAT PREVALON FULL STRYKER (MISCELLANEOUS) ×1 IMPLANT
PACK EP LATEX FREE (CUSTOM PROCEDURE TRAY) ×2
PACK EP LF (CUSTOM PROCEDURE TRAY) ×1 IMPLANT
PAD PRO RADIOLUCENT 2001M-C (PAD) ×2 IMPLANT
SHEATH PINNACLE 6F 10CM (SHEATH) ×1 IMPLANT
SHEATH PINNACLE 7F 10CM (SHEATH) ×2 IMPLANT
SHEATH PINNACLE 8F 10CM (SHEATH) ×1 IMPLANT
TUBING SMART ABLATE COOLFLOW (TUBING) ×1 IMPLANT

## 2020-12-22 NOTE — Discharge Instructions (Signed)

## 2020-12-22 NOTE — Progress Notes (Signed)
Site area: Right groin a 6,7,and 8 french venous sheaths were removed  Site Prior to Removal:  Level 0  Pressure Applied For 20 MINUTES    Bedrest Beginning at 1030am X 6 hours  Manual:   Yes.    Patient Status During Pull:  stable  Post Pull Groin Site:  Level 0  Post Pull Instructions Given:  Yes.    Post Pull Pulses Present:  Yes.    Dressing Applied:  Yes.    Comments:

## 2020-12-22 NOTE — Progress Notes (Signed)
Site area: Right Internal Jugular a 7 french venous sheath was removed  Site Prior to Removal:  Level 0  Pressure Applied For 5 MINUTES    Bedrest Beginning at 1030am  Manual:   Yes.    Patient Status During Pull:  stable  Post Pull Groin Site:  Level 0  Post Pull Instructions Given:  Yes.    Post Pull Pulses Present:  Yes.    Dressing Applied:  Yes.    Comments:
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
## 2020-12-24 ENCOUNTER — Encounter: Payer: Self-pay | Admitting: *Deleted

## 2020-12-25 ENCOUNTER — Encounter (HOSPITAL_COMMUNITY): Payer: Self-pay | Admitting: Internal Medicine

## 2021-01-23 ENCOUNTER — Encounter: Payer: Self-pay | Admitting: *Deleted

## 2021-01-23 ENCOUNTER — Ambulatory Visit: Payer: Medicare Other | Admitting: Student

## 2021-01-25 ENCOUNTER — Encounter: Payer: Self-pay | Admitting: Internal Medicine

## 2021-01-25 ENCOUNTER — Other Ambulatory Visit: Payer: Self-pay

## 2021-01-25 ENCOUNTER — Ambulatory Visit (INDEPENDENT_AMBULATORY_CARE_PROVIDER_SITE_OTHER): Payer: Medicare Other | Admitting: Internal Medicine

## 2021-01-25 VITALS — BP 98/54 | HR 67 | Ht 67.0 in | Wt 134.2 lb

## 2021-01-25 DIAGNOSIS — I483 Typical atrial flutter: Secondary | ICD-10-CM | POA: Diagnosis not present

## 2021-01-25 MED ORDER — ASPIRIN EC 81 MG PO TBEC: 81.0000 mg | DELAYED_RELEASE_TABLET | Freq: Every day | ORAL | 3 refills | Status: AC
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
## 2021-01-25 NOTE — Patient Instructions (Signed)
Medication Instructions:  Your physician has recommended you make the following change in your medication:    STOP Xarelto  2.    START taking aspirin 81 mg-  Take one tablet by mouth daily  Labwork: None ordered.  Testing/Procedures: None ordered.  Follow-Up: Your physician wants you to follow-up in: as needed with Cristopher Peru, MD   Any Other Special Instructions Will Be Listed Below (If Applicable).  If you need a refill on your cardiac medications before your next appointment, please call your pharmacy.

## 2021-02-07 ENCOUNTER — Other Ambulatory Visit: Payer: Self-pay

## 2021-02-07 ENCOUNTER — Encounter: Payer: Self-pay | Admitting: Internal Medicine

## 2021-02-07 ENCOUNTER — Ambulatory Visit (INDEPENDENT_AMBULATORY_CARE_PROVIDER_SITE_OTHER): Payer: Medicare Other | Admitting: Internal Medicine

## 2021-02-07 VITALS — BP 116/65 | HR 80 | Temp 97.9°F | Ht 67.0 in | Wt 139.9 lb

## 2021-02-07 DIAGNOSIS — J9611 Chronic respiratory failure with hypoxia: Secondary | ICD-10-CM

## 2021-02-07 DIAGNOSIS — I483 Typical atrial flutter: Secondary | ICD-10-CM

## 2021-02-07 DIAGNOSIS — M25531 Pain in right wrist: Secondary | ICD-10-CM | POA: Diagnosis not present

## 2021-02-07 DIAGNOSIS — R0902 Hypoxemia: Secondary | ICD-10-CM

## 2021-02-07 DIAGNOSIS — Z8679 Personal history of other diseases of the circulatory system: Secondary | ICD-10-CM | POA: Diagnosis not present

## 2021-02-07 DIAGNOSIS — C3412 Malignant neoplasm of upper lobe, left bronchus or lung: Secondary | ICD-10-CM

## 2021-02-07 MED ORDER — BISOPROLOL FUMARATE 5 MG PO TABS: 2.5000 mg | ORAL_TABLET | Freq: Every day | ORAL | 6 refills | Status: DC

## 2021-02-07 NOTE — Patient Instructions (Addendum)
[name], it was a pleasure seeing you today!  Today we discussed: Hypoxia- continue with your albuterol as needed. Sounds like you are doing a great job of monitoring your oxygen with a pulse, continue with that as you can tolerate more exercise.  I will also see if we can get an O2 compressor for you so that mobility will be easier, will update you on the status of that.  Would advise getting covid booster as discussed.   I have ordered the following labs today:  Lab Orders  No laboratory test(s) ordered today     Tests ordered today:  none  Referrals ordered today:   Referral Orders  No referral(s) requested today     I have ordered the following medication/changed the following medications:   Stop the following medications: There are no discontinued medications.   Start the following medications: No orders of the defined types were placed in this encounter.    Follow-up: 3 months   Please make sure to arrive 15 minutes prior to your next appointment. If you arrive late, you may be asked to reschedule.   We look forward to seeing you next time. Please call our clinic at [phone] if you have any questions or concerns. The best time to call is Monday-Friday from 9am-4pm, but there is someone available 24/7. If after hours or the weekend, call the main hospital number and ask for the Internal Medicine Resident On-Call. If you need medication refills, please notify your pharmacy one week in advance and they will send [place] a request.  Thank you for letting us take part in your care. Wishing you the best!  Thank you, Dr. Howie Ill
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
## 2021-02-08 NOTE — Assessment & Plan Note (Signed)
Patient with history of atrial flutter s/p ablation 06/22.  He seems to be in sinus rhythm today and states that the last EKG at his cardiologist reflected that.  He is not currently taking Xarelto due to cardiology recommendations in the setting of his lung cancer, they think the risks outweight the benefits for long-term anticoagulation.

## 2021-02-08 NOTE — Assessment & Plan Note (Signed)
Patient diagnosed in 2021 with stage 1 non-small cell lung cancer.  Underwent 2 rounds of radiation in 01/22.  Patient continues to follow with radiation oncology, no further treatment planned for now.  Next CT scan in 08/22.

## 2021-02-08 NOTE — Assessment & Plan Note (Addendum)
Patient states that his shortness of breath has improved greatly following his ablation in 06/22.  He monitors he oxygen saturation with a pulse ox and wear the pulse ox when he goes to the mailbox or does chores around the house.  He expressed increasing frustrations with lack of mobility due to oxygen tanks and having to worry about if his oxygen will run out when he runs errands.  He asked about an oxygen compressor to help him increase his mobility and freedom to go on longer outings.  -placed DME order for o2 compressor -Follow up in 3 months to see if shortness of breath continues to improve

## 2021-02-08 NOTE — Assessment & Plan Note (Signed)
The pain in his right wrist continues intermittenly.  He said that he uses the voltaren frequently, especially when it swells.  Wrist Xr on 04/22 showed an area possibly concerning for metastasis, however his oncologist did not agree with this finding.

## 2021-02-08 NOTE — Addendum Note (Signed)
Addended by: Edwyna Perfect on: 02/08/2021 09:06 AM   Modules accepted: Orders

## 2021-02-10 NOTE — Progress Notes (Signed)
Internal Medicine Clinic Attending  I saw and evaluated the patient.  I personally confirmed the key portions of the history and exam documented by Dr. Masters and I reviewed pertinent patient test results.  The assessment, diagnosis, and plan were formulated together and I agree with the documentation in the resident's note.  

## 2021-02-19 ENCOUNTER — Telehealth: Payer: Self-pay

## 2021-02-19 DIAGNOSIS — Z23 Encounter for immunization: Secondary | ICD-10-CM | POA: Diagnosis not present

## 2021-02-19 NOTE — Telephone Encounter (Signed)
Carola Rhine, RN; Eugenia Pancoast; Miquel Dunn; 1 other  Received! Thanks!

## 2021-02-19 NOTE — Telephone Encounter (Signed)
CM sent to Stephannie Peters at [facility] for POC. F2F was 7/20.

## 2021-02-19 NOTE — Telephone Encounter (Signed)
Requesting to speak with a nurse about getting portable oxygen. Please call back.

## 2021-02-27 ENCOUNTER — Telehealth: Payer: Self-pay | Admitting: *Deleted

## 2021-02-27 NOTE — Telephone Encounter (Signed)
CALLED PATIENT TO INFORM OF STAT LABS ON 03-12-21 @ 11:30 AM @ [place] AND HIS CT TO FOLLOW- PATIENT TO ARRIVE @ 12:15 PM @ [facility], PATIENT TO HAVE WATER ONLY - 4 HRS. PRIOR TO TEST, PATIENT TO RECEIVE RESULTS FROM ALISON PERKINS ON 03-13-21 VIA TELEPHONE @ 8:30 AM , LVM FOR A RETURN CALL

## 2021-03-09 ENCOUNTER — Encounter: Payer: Self-pay | Admitting: Radiation Oncology

## 2021-03-09 NOTE — Progress Notes (Signed)
Patient reports mild fatigue, cough, roughly 10lbs weight loss, but a good appetite. No pain, painful swallowing, choking, or shortness of breath. Meaningful use questions complete and patient notified of 8:30am telephone appointment and expressed understanding.

## 2021-03-10 ENCOUNTER — Other Ambulatory Visit: Payer: Self-pay | Admitting: Internal Medicine

## 2021-03-12 ENCOUNTER — Other Ambulatory Visit: Payer: Self-pay

## 2021-03-12 ENCOUNTER — Ambulatory Visit
Admission: RE | Admit: 2021-03-12 | Discharge: 2021-03-12 | Disposition: A | Discharge status: Home / Self Care | Payer: Medicare Other | Source: Ambulatory Visit | Attending: Radiation Oncology | Admitting: Radiation Oncology

## 2021-03-12 ENCOUNTER — Ambulatory Visit (HOSPITAL_COMMUNITY)
Admission: RE | Admit: 2021-03-12 | Discharge: 2021-03-12 | Disposition: A | Discharge status: Home / Self Care | Payer: Medicare Other | Source: Ambulatory Visit | Attending: Radiation Oncology | Admitting: Radiation Oncology

## 2021-03-12 DIAGNOSIS — Z510 Encounter for antineoplastic radiation therapy: Secondary | ICD-10-CM | POA: Diagnosis not present

## 2021-03-12 DIAGNOSIS — C3490 Malignant neoplasm of unspecified part of unspecified bronchus or lung: Secondary | ICD-10-CM | POA: Diagnosis not present

## 2021-03-12 DIAGNOSIS — R911 Solitary pulmonary nodule: Secondary | ICD-10-CM | POA: Diagnosis not present

## 2021-03-12 DIAGNOSIS — C3412 Malignant neoplasm of upper lobe, left bronchus or lung: Secondary | ICD-10-CM | POA: Insufficient documentation

## 2021-03-12 DIAGNOSIS — R918 Other nonspecific abnormal finding of lung field: Secondary | ICD-10-CM | POA: Diagnosis not present

## 2021-03-12 DIAGNOSIS — I700 Atherosclerosis of aorta: Secondary | ICD-10-CM | POA: Diagnosis not present

## 2021-03-12 DIAGNOSIS — J439 Emphysema, unspecified: Secondary | ICD-10-CM | POA: Diagnosis not present

## 2021-03-12 LAB — BUN & CREATININE (CHCC)
BUN: 17 mg/dL (ref 8–23)
Creatinine: 0.72 mg/dL (ref 0.61–1.24)
GFR, Estimated: >60 mL/min (ref >60)

## 2021-03-12 MED ORDER — IOHEXOL 350 MG/ML SOLN
100.0000 mL | Freq: Once | INTRAVENOUS | Status: AC | PRN
  Administered 2021-03-12: 60 mL via INTRAVENOUS

## 2021-03-12 MED ORDER — IOHEXOL 300 MG/ML  SOLN: 100.0000 mL | Freq: Once | INTRAMUSCULAR | Status: DC | PRN

## 2021-03-13 ENCOUNTER — Ambulatory Visit
Admission: RE | Admit: 2021-03-13 | Discharge: 2021-03-13 | Disposition: A | Discharge status: Home / Self Care | Payer: Medicare Other | Source: Ambulatory Visit | Attending: Radiation Oncology | Admitting: Radiation Oncology

## 2021-03-13 ENCOUNTER — Encounter (HOSPITAL_COMMUNITY): Payer: Self-pay | Admitting: Radiology

## 2021-03-13 DIAGNOSIS — M25531 Pain in right wrist: Secondary | ICD-10-CM

## 2021-03-13 DIAGNOSIS — C3412 Malignant neoplasm of upper lobe, left bronchus or lung: Secondary | ICD-10-CM | POA: Diagnosis not present

## 2021-03-13 DIAGNOSIS — Z08 Encounter for follow-up examination after completed treatment for malignant neoplasm: Secondary | ICD-10-CM | POA: Diagnosis not present
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
## 2021-03-13 NOTE — Progress Notes (Signed)
Patient Name  Jason Carter, Jason Carter Legal Sex  Male DOB  05/07/1946 SSN  [id number] Address  [address]  [place] [address] Phone  [phone] (Home) *Preferred*  [phone] (Mobile)    RE: [place] AXILLARY NODE CORE BIOPSY RIGHT Received: Today Criselda Peaches, MD  Garth Bigness D; P Ir Procedure Requests Approved for US guided bx of RIGHT axillary LNs. Hx LEFT lung cancer.  Atypical metastatic pattern vs lymphoma?    HKM        Previous Messages   ----- Message -----  From: Garth Bigness D  Sent: 03/13/2021  12:11 PM EDT  To: Ir Procedure Requests  Subject: [place] AXILLARY NODE CORE BIOPSY RIGHT             Procedure:  [place] AXILLARY NODE CORE BIOPSY RIGHT   Reason:  Malignant neoplasm of upper lobe of left lung, eval for Right axillary nodes seen on CT chest   History:  CT in computer   Provider:  Hayden Pedro   Provider Contact:  [phone]

## 2021-03-21 ENCOUNTER — Other Ambulatory Visit: Payer: Self-pay | Admitting: Radiology

## 2021-03-22 ENCOUNTER — Ambulatory Visit (HOSPITAL_COMMUNITY)
Admission: RE | Admit: 2021-03-22 | Discharge: 2021-03-22 | Disposition: A | Discharge status: Home / Self Care | Payer: Medicare Other | Source: Ambulatory Visit | Attending: Radiation Oncology | Admitting: Radiation Oncology

## 2021-03-22 ENCOUNTER — Encounter (HOSPITAL_COMMUNITY): Payer: Self-pay

## 2021-03-22 ENCOUNTER — Other Ambulatory Visit: Payer: Self-pay

## 2021-03-22 DIAGNOSIS — R590 Localized enlarged lymph nodes: Secondary | ICD-10-CM | POA: Diagnosis not present

## 2021-03-22 DIAGNOSIS — C3412 Malignant neoplasm of upper lobe, left bronchus or lung: Secondary | ICD-10-CM | POA: Diagnosis not present

## 2021-03-22 DIAGNOSIS — R599 Enlarged lymph nodes, unspecified: Secondary | ICD-10-CM | POA: Diagnosis not present

## 2021-03-22 DIAGNOSIS — C3492 Malignant neoplasm of unspecified part of left bronchus or lung: Secondary | ICD-10-CM | POA: Diagnosis not present

## 2021-03-22 MED ORDER — LIDOCAINE HCL (PF) 1 % IJ SOLN
INTRAMUSCULAR | Status: AC
  Filled 2021-03-22: qty 30

## 2021-03-22 MED ORDER — FENTANYL CITRATE (PF) 100 MCG/2ML IJ SOLN
INTRAMUSCULAR | Status: AC
  Filled 2021-03-22: qty 2

## 2021-03-22 MED ORDER — SODIUM CHLORIDE 0.9 % IV SOLN: INTRAVENOUS | Status: DC

## 2021-03-22 MED ORDER — MIDAZOLAM HCL 2 MG/2ML IJ SOLN
INTRAMUSCULAR | Status: AC | PRN
  Administered 2021-03-22: 1 mg via INTRAVENOUS

## 2021-03-22 MED ORDER — MIDAZOLAM HCL 2 MG/2ML IJ SOLN
INTRAMUSCULAR | Status: AC
  Filled 2021-03-22: qty 2

## 2021-03-22 MED ORDER — FENTANYL CITRATE (PF) 100 MCG/2ML IJ SOLN
INTRAMUSCULAR | Status: AC | PRN
  Administered 2021-03-22: 25 ug via INTRAVENOUS

## 2021-03-22 NOTE — Procedures (Signed)
Interventional Radiology Procedure Note  Procedure: [place] RT AX ADENOPATHY CORE BX    Complications: None  Estimated Blood Loss:  MIN  Findings: 18 G CORES IN SALINE    Tamera Punt, MD
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
## 2021-03-29 LAB — SURGICAL PATHOLOGY

## 2021-04-03 ENCOUNTER — Telehealth: Payer: Self-pay | Admitting: Radiation Oncology

## 2021-04-03 DIAGNOSIS — C3412 Malignant neoplasm of upper lobe, left bronchus or lung: Secondary | ICD-10-CM

## 2021-04-03 NOTE — Telephone Encounter (Signed)
I called the patient to let him know that his axillary biopsy did not show definitive concerns for cancer but was inconclusive with staining patterns and the recommendations from Dr. Lisbeth Renshaw was to proceed with referral to general surgery for further assessment of this with an excisional biopsy. I had to leave a voicemail however asking him to call me back so we could discuss his biopsy and next steps.

## 2021-04-04 ENCOUNTER — Telehealth: Payer: Self-pay | Admitting: Radiation Oncology

## 2021-04-04 NOTE — Telephone Encounter (Signed)
The patient called back and he is still having welling in his right wrist and pain from prior injury when changing tires. He does have some low back pain and has had some left sided groin discomfort and fullness. He also describes neck discomfort and range of motion difficulties. He has still been unsuccessful at weight gain. He is on the schedule for CT surgery which is incorrect. I will work with our scheduling dept to refer to general surgery.

## 2021-04-10 ENCOUNTER — Encounter: Payer: Medicare Other | Admitting: Thoracic Surgery (Cardiothoracic Vascular Surgery)

## 2021-04-11 ENCOUNTER — Telehealth: Payer: Self-pay | Admitting: Radiation Oncology

## 2021-04-11 NOTE — Telephone Encounter (Signed)
Called patient to inform him of his consultation appointment with Dr. Brantley Stage at [facility] on 04/17/21 at 3:00 pm - 2:30 pm arrival time. Patient voiced understanding.

## 2021-04-17 ENCOUNTER — Ambulatory Visit: Payer: Self-pay | Admitting: Surgery

## 2021-04-17 DIAGNOSIS — R590 Localized enlarged lymph nodes: Secondary | ICD-10-CM | POA: Diagnosis not present

## 2021-04-19 DIAGNOSIS — R7309 Other abnormal glucose: Secondary | ICD-10-CM | POA: Diagnosis not present

## 2021-04-19 DIAGNOSIS — H40052 Ocular hypertension, left eye: Secondary | ICD-10-CM | POA: Diagnosis not present

## 2021-04-19 DIAGNOSIS — H35352 Cystoid macular degeneration, left eye: Secondary | ICD-10-CM | POA: Diagnosis not present

## 2021-04-19 DIAGNOSIS — H40013 Open angle with borderline findings, low risk, bilateral: Secondary | ICD-10-CM | POA: Diagnosis not present

## 2021-04-26 DIAGNOSIS — Z23 Encounter for immunization: Secondary | ICD-10-CM | POA: Diagnosis not present

## 2021-05-07 NOTE — Pre-Procedure Instructions (Signed)
Surgical Instructions    Your procedure is scheduled on Thursday, October 27th.  Report to [facility] Main Entrance "A" at 8:30 A.M., then check in with the Admitting office.  Call this number if you have problems the morning of surgery:  [phone]   If you have any questions prior to your surgery date call [phone]: Open Monday-Friday 8am-4pm    Remember:  Do not eat after midnight the night before your surgery  You may drink clear liquids until 7:30 a.m. the morning of your surgery.   Clear liquids allowed are: Water, Non-Citrus Juices (without pulp), Carbonated Beverages, Clear Tea, Black Coffee Only, and Gatorade.    Take these medicines the morning of surgery with A SIP OF WATER  bisoprolol (ZEBETA)  fluticasone (FLONASE)   As needed: Albuterol inhaler-please bring to the hospital Eye drops  Follow your surgeon's instructions on when to stop Aspirin.  If no instructions were given by your surgeon then you will need to call the office to get those instructions.    As of today, STOP taking any Aspirin (unless otherwise instructed by your surgeon) Aleve, Naproxen, Ibuprofen, Motrin, Advil, Goody's, BC's, all herbal medications, fish oil, and all vitamins. This includes: diclofenac Sodium (VOLTAREN) 1 % GEL.                     Do NOT Smoke (Tobacco/Vaping) or drink Alcohol 24 hours prior to your procedure.  If you use a CPAP at night, you may bring all equipment for your overnight stay.   Contacts, glasses, piercing's, hearing aid's, dentures or partials may not be worn into surgery, please bring cases for these belongings.    For patients admitted to the hospital, discharge time will be determined by your treatment team.   Patients discharged the day of surgery will not be allowed to drive home, and someone needs to stay with them for 24 hours.  NO VISITORS WILL BE ALLOWED IN PRE-OP WHERE PATIENTS GET READY FOR SURGERY.  ONLY 1 SUPPORT PERSON MAY BE PRESENT IN THE  WAITING ROOM WHILE YOU ARE IN SURGERY.  IF YOU ARE TO BE ADMITTED, ONCE YOU ARE IN YOUR ROOM YOU WILL BE ALLOWED TWO (2) VISITORS.  Minor children may have two parents present. Special consideration for safety and communication needs will be reviewed on a case by case basis.   Special instructions:   [facility]- Preparing For Surgery  Before surgery, you can play an important role. Because skin is not sterile, your skin needs to be as free of germs as possible. You can reduce the number of germs on your skin by washing with CHG (chlorahexidine gluconate) Soap before surgery.  CHG is an antiseptic cleaner which kills germs and bonds with the skin to continue killing germs even after washing.    Oral Hygiene is also important to reduce your risk of infection.  Remember - BRUSH YOUR TEETH THE MORNING OF SURGERY WITH YOUR REGULAR TOOTHPASTE  Please do not use if you have an allergy to CHG or antibacterial soaps. If your skin becomes reddened/irritated stop using the CHG.  Do not shave (including legs and underarms) for at least 48 hours prior to first CHG shower. It is OK to shave your face.  Please follow these instructions carefully.   Shower the NIGHT BEFORE SURGERY and the MORNING OF SURGERY  If you chose to wash your hair, wash your hair first as usual with your normal shampoo.  After you shampoo, rinse your  hair and body thoroughly to remove the shampoo.  Use CHG Soap as you would any other liquid soap. You can apply CHG directly to the skin and wash gently with a scrungie or a clean washcloth.   Apply the CHG Soap to your body ONLY FROM THE NECK DOWN.  Do not use on open wounds or open sores. Avoid contact with your eyes, ears, mouth and genitals (private parts). Wash Face and genitals (private parts)  with your normal soap.   Wash thoroughly, paying special attention to the area where your surgery will be performed.  Thoroughly rinse your body with warm water from the neck down.  DO  NOT shower/wash with your normal soap after using and rinsing off the CHG Soap.  Pat yourself dry with a CLEAN TOWEL.  Wear CLEAN PAJAMAS to bed the night before surgery  Place CLEAN SHEETS on your bed the night before your surgery  DO NOT SLEEP WITH PETS.   Day of Surgery: Shower with CHG soap. Do not wear jewelry. Do not wear lotions, powders, colognes, or deodorant. Do not shave 48 hours prior to surgery.  Men may shave face and neck. Do not bring valuables to the hospital. [facility] is not responsible for any belongings or valuables. Wear Clean/Comfortable clothing the morning of surgery Remember to brush your teeth WITH YOUR REGULAR TOOTHPASTE.   Please read over the following fact sheets that you were given.   3 days prior to your procedure or After your COVID test   You are not required to quarantine however you are required to wear a well-fitting mask when you are out and around people not in your household. If your mask becomes wet or soiled, replace with a new one.   Wash your hands often with soap and water for 20 seconds or clean your hands with an alcohol-based hand sanitizer that contains at least 60% alcohol.   Do not share personal items.   Notify your provider:  o if you are in close contact with someone who has COVID  o or if you develop a fever of 100.4 or greater, sneezing, cough, sore throat, shortness of breath or body aches.

## 2021-05-08 ENCOUNTER — Encounter (HOSPITAL_COMMUNITY)
Admission: RE | Admit: 2021-05-08 | Discharge: 2021-05-08 | Disposition: A | Discharge status: Home / Self Care | Payer: Medicare Other | Source: Ambulatory Visit | Attending: Surgery | Admitting: Surgery

## 2021-05-08 ENCOUNTER — Other Ambulatory Visit: Payer: Self-pay

## 2021-05-08 ENCOUNTER — Encounter (HOSPITAL_COMMUNITY): Payer: Self-pay

## 2021-05-08 DIAGNOSIS — J439 Emphysema, unspecified: Secondary | ICD-10-CM | POA: Insufficient documentation

## 2021-05-08 DIAGNOSIS — Z9981 Dependence on supplemental oxygen: Secondary | ICD-10-CM | POA: Diagnosis not present

## 2021-05-08 DIAGNOSIS — Z7982 Long term (current) use of aspirin: Secondary | ICD-10-CM | POA: Insufficient documentation

## 2021-05-08 DIAGNOSIS — R591 Generalized enlarged lymph nodes: Secondary | ICD-10-CM | POA: Insufficient documentation

## 2021-05-08 DIAGNOSIS — Z85118 Personal history of other malignant neoplasm of bronchus and lung: Secondary | ICD-10-CM | POA: Diagnosis not present

## 2021-05-08 DIAGNOSIS — Z79899 Other long term (current) drug therapy: Secondary | ICD-10-CM | POA: Diagnosis not present

## 2021-05-08 DIAGNOSIS — I4891 Unspecified atrial fibrillation: Secondary | ICD-10-CM | POA: Insufficient documentation

## 2021-05-08 DIAGNOSIS — Z87891 Personal history of nicotine dependence: Secondary | ICD-10-CM | POA: Insufficient documentation

## 2021-05-08 DIAGNOSIS — R7303 Prediabetes: Secondary | ICD-10-CM | POA: Diagnosis not present

## 2021-05-08 DIAGNOSIS — Z01812 Encounter for preprocedural laboratory examination: Secondary | ICD-10-CM | POA: Diagnosis not present

## 2021-05-08 DIAGNOSIS — Z8616 Personal history of COVID-19: Secondary | ICD-10-CM | POA: Diagnosis not present

## 2021-05-08 HISTORY — DX: Cardiac arrhythmia, unspecified: I49.9

## 2021-05-08 HISTORY — DX: Prediabetes: R73.03

## 2021-05-08 HISTORY — DX: Heart failure, unspecified: I50.9

## 2021-05-08 HISTORY — DX: Emphysema, unspecified: J43.9

## 2021-05-08 LAB — COMPREHENSIVE METABOLIC PANEL
ALT: 19 U/L (ref 0–44)
AST: 19 U/L (ref 15–41)
Albumin: 3.5 g/dL (ref 3.5–5.0)
Alkaline Phosphatase: 59 U/L (ref 38–126)
Anion gap: 7 (ref 5–15)
BUN: 13 mg/dL (ref 8–23)
CO2: 29 mmol/L (ref 22–32)
Calcium: 9.2 mg/dL (ref 8.9–10.3)
Chloride: 101 mmol/L (ref 98–111)
Creatinine, Ser: 0.56 mg/dL — ABNORMAL LOW (ref 0.61–1.24)
GFR, Estimated: >60 mL/min (ref >60)
Glucose, Bld: 90 mg/dL (ref 70–99)
Potassium: 4.3 mmol/L (ref 3.5–5.1)
Sodium: 137 mmol/L (ref 135–145)
Total Bilirubin: 0.7 mg/dL (ref 0.3–1.2)
Total Protein: 7.7 g/dL (ref 6.5–8.1)

## 2021-05-08 LAB — CBC WITH DIFFERENTIAL/PLATELET
Abs Immature Granulocytes: 0.03 10*3/uL (ref 0.00–0.07)
Basophils Absolute: 0.1 10*3/uL (ref 0.0–0.1)
Basophils Relative: 1 %
Eosinophils Absolute: 0.5 10*3/uL (ref 0.0–0.5)
Eosinophils Relative: 6 %
HCT: 38.1 % — ABNORMAL LOW (ref 39.0–52.0)
Hemoglobin: 11.4 g/dL — ABNORMAL LOW (ref 13.0–17.0)
Immature Granulocytes: 0 %
Lymphocytes Relative: 23 %
Lymphs Abs: 2 10*3/uL (ref 0.7–4.0)
MCH: 27.8 pg (ref 26.0–34.0)
MCHC: 29.9 g/dL — ABNORMAL LOW (ref 30.0–36.0)
MCV: 92.9 fL (ref 80.0–100.0)
Monocytes Absolute: 0.7 10*3/uL (ref 0.1–1.0)
Monocytes Relative: 8 %
Neutro Abs: 5.4 10*3/uL (ref 1.7–7.7)
Neutrophils Relative %: 62 %
Platelets: 230 10*3/uL (ref 150–400)
RBC: 4.1 MIL/uL — ABNORMAL LOW (ref 4.22–5.81)
RDW: 13.2 % (ref 11.5–15.5)
WBC: 8.7 10*3/uL (ref 4.0–10.5)
nRBC: 0 % (ref 0.0–0.2)

## 2021-05-08 LAB — HEMOGLOBIN A1C
Hgb A1c MFr Bld: 5.7 % — ABNORMAL HIGH (ref 4.8–5.6)
Mean Plasma Glucose: 116.89 mg/dL

## 2021-05-08 NOTE — Progress Notes (Signed)
PCP - Christiana Fuchs, DO Cardiologist - Dr. Jerilynn Mages. [place] EP-Dr. Lovena Le (Cardioversion and Ablations)  Chest x-ray - n/a EKG - 01/26/21 Stress Test - denies ECHO - 11/13/20 Cardiac Cath - denies  Sleep Study - denies CPAP - denies  Pre-diabetic. Will collect A1C today.   Blood Thinner Instructions: n/a Aspirin Instructions:No instructions given by surgeon. Advised pt to call surgeons office for instructions.   ERAS Protcol -Clear liquids until 0730 PRE-SURGERY Ensure or G2- none ordered.   COVID TEST- not indicated. Ambulatory surgery.  Anesthesia review: Yes, extensive cardiac history. HF with chronic supplemental oxygen needs. Pt on 2L since 10/2020. Pt sated that he has not been taken off of the oxygen as of yet.  Patient denies shortness of breath, fever, cough and chest pain at PAT appointment   All instructions explained to the patient, with a verbal understanding of the material. Patient agrees to go over the instructions while at home for a better understanding. Patient also instructed to self quarantine after being tested for COVID-19. The opportunity to ask questions was provided.

## 2021-05-09 ENCOUNTER — Telehealth: Payer: Self-pay | Admitting: Internal Medicine

## 2021-05-09 ENCOUNTER — Other Ambulatory Visit (HOSPITAL_COMMUNITY): Payer: Medicare Other

## 2021-05-09 NOTE — Telephone Encounter (Signed)
I s/w the pt and he tells me of the procedure with Dr. Erroll Luna for a Bx of a lymph node. I stated that we had not received a clearance request though I will call Dr. Josetta Huddle office and get the information needed.   I s/w Tonya with Dr. Josetta Huddle office. I did state to her that we never received a clearance. I did tell her that I can see the surgery information in epic though I would like to confirm. Tonya agreeable. I have confirmed procedure and all other pertinent pre op information needed. See clearance below.    Pre-operative Risk Assessment    Patient Name: Jason Carter  DOB: 08-01-1945 MRN: [id number]      Request for Surgical Clearance   Procedure:   RIGHT  AXILLARY LYMPH NODE Bx   Date of Surgery: Clearance 05/17/21                                 Surgeon:  DR. Erroll Luna Surgeon's Group or Practice Name:  [facility] Phone number:  [phone] Fax number:  [phone]   Type of Clearance Requested: - Medical  - Pharmacy:  Hold Aspirin     Type of Anesthesia:   CHOICE   Additional requests/questions:   Jiles Prows   05/09/2021, 3:30 PM

## 2021-05-09 NOTE — Telephone Encounter (Signed)
Left message to call back on 05/09/2021 at 1632.  Patient is call back.

## 2021-05-09 NOTE — Telephone Encounter (Signed)
He is having a lymph node biopsy done on 05/17/21, they told him he needs to hold his aspirin as of 10/20 he wants to know if that is okay to do that.

## 2021-05-09 NOTE — Telephone Encounter (Signed)
Ethyn Schetter 75 year old man is requesting preoperative cardiac evaluation for right axillary lymph node biopsy.  He was last seen in the clinic on 01/25/2021.  He had developed atrial flutter and underwent EP study with catheter ablation.  It was felt that his atrial flutter was related to his lung cancer.  During that time he reported that he did not feel any different with maintaining normal sinus rhythm.  His Xarelto was discontinued and he was started on 81 mg aspirin.  He denied chest pain, shortness of breath, syncope, and palpitations.  His PMH includes atrial fibrillation, atrial flutter, lung cancer, gout, hypertensive retinopathy, and shortness of breath.    May his aspirin be held prior to his procedure?  Thank you for your help.  Please direct your response to CV DIV preop pool.  Jossie Ng. Rahmon Heigl NP-C    05/09/2021, 3:43 PM [address] Office [phone] Fax [phone]

## 2021-05-10 NOTE — Telephone Encounter (Signed)
Jason Carter is returning Jesse's call.

## 2021-05-10 NOTE — Telephone Encounter (Signed)
   Primary Cardiologist: Kirk Ruths, MD  Chart reviewed as part of pre-operative protocol coverage. Given past medical history and time since last visit, based on ACC/AHA guidelines, Jason Carter would be at acceptable risk for the planned procedure without further cardiovascular testing.   His aspirin may be held for 5 to 7 days prior to his biopsy.  Please resume as soon as hemostasis is achieved.  Patient was advised that if he develops new symptoms prior to surgery to contact our office to arrange a follow-up appointment.  He verbalized understanding.  I will route this recommendation to the requesting party via Epic fax function and remove from pre-op pool.  Please call with questions.  Jason Ng. Jason Breed NP-C    05/10/2021, 3:31 PM [address] Office [phone] Fax [phone]

## 2021-05-10 NOTE — Anesthesia Preprocedure Evaluation (Addendum)
Anesthesia Evaluation  Patient identified by MRN, date of birth, ID band Patient awake    Reviewed: Allergy & Precautions, NPO status , Patient's Chart, lab work & pertinent test results, reviewed documented beta blocker date and time   Airway Mallampati: II  TM Distance: >3 FB Neck ROM: Full    Dental  (+) Dental Advisory Given, Edentulous Upper,    Pulmonary COPD,  COPD inhaler and oxygen dependent, former smoker,  Lung cancer   Pulmonary exam normal breath sounds clear to auscultation       Cardiovascular hypertension, Pt. on home beta blockers +CHF  Normal cardiovascular exam+ dysrhythmias Atrial Fibrillation  Rhythm:Regular Rate:Normal     Neuro/Psych negative neurological ROS  negative psych ROS   GI/Hepatic negative GI ROS, Neg liver ROS,   Endo/Other  negative endocrine ROS  Renal/GU negative Renal ROS     Musculoskeletal negative musculoskeletal ROS (+)   Abdominal   Peds  Hematology  (+) Blood dyscrasia, anemia ,   Anesthesia Other Findings Day of surgery medications reviewed with the patient.  LYMPHADENOPATHY  Reproductive/Obstetrics                           Anesthesia Physical Anesthesia Plan  ASA: 4  Anesthesia Plan: General   Post-op Pain Management:    Induction: Intravenous  PONV Risk Score and Plan: 2 and Dexamethasone and Ondansetron  Airway Management Planned: LMA  Additional Equipment:   Intra-op Plan:   Post-operative Plan: Extubation in OR  Informed Consent: I have reviewed the patients History and Physical, chart, labs and discussed the procedure including the risks, benefits and alternatives for the proposed anesthesia with the patient or authorized representative who has indicated his/her understanding and acceptance.     Dental advisory given  Plan Discussed with: CRNA  Anesthesia Plan Comments: (PAT note written 05/10/2021 by Myra Gianotti, PA-C. )       Anesthesia Quick Evaluation
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
## 2021-05-17 ENCOUNTER — Encounter (HOSPITAL_COMMUNITY): Payer: Self-pay | Admitting: Surgery

## 2021-05-17 ENCOUNTER — Encounter (HOSPITAL_COMMUNITY)
Admission: RE | Disposition: A | Discharge status: Home / Self Care | Payer: Self-pay | Source: Home / Self Care | Attending: Surgery

## 2021-05-17 ENCOUNTER — Ambulatory Visit (HOSPITAL_COMMUNITY): Payer: Medicare Other | Admitting: Anesthesiology

## 2021-05-17 ENCOUNTER — Other Ambulatory Visit: Payer: Self-pay

## 2021-05-17 ENCOUNTER — Ambulatory Visit (HOSPITAL_COMMUNITY)
Admission: RE | Admit: 2021-05-17 | Discharge: 2021-05-17 | Disposition: A | Discharge status: Home / Self Care | Payer: Medicare Other | Attending: Surgery | Admitting: Surgery

## 2021-05-17 ENCOUNTER — Ambulatory Visit (HOSPITAL_COMMUNITY): Payer: Medicare Other | Admitting: Vascular Surgery

## 2021-05-17 DIAGNOSIS — D72822 Plasmacytosis: Secondary | ICD-10-CM | POA: Diagnosis not present

## 2021-05-17 DIAGNOSIS — R590 Localized enlarged lymph nodes: Secondary | ICD-10-CM | POA: Insufficient documentation

## 2021-05-17 DIAGNOSIS — J439 Emphysema, unspecified: Secondary | ICD-10-CM | POA: Insufficient documentation

## 2021-05-17 DIAGNOSIS — Z79899 Other long term (current) drug therapy: Secondary | ICD-10-CM | POA: Diagnosis not present

## 2021-05-17 DIAGNOSIS — R634 Abnormal weight loss: Secondary | ICD-10-CM | POA: Insufficient documentation

## 2021-05-17 DIAGNOSIS — Z87891 Personal history of nicotine dependence: Secondary | ICD-10-CM | POA: Diagnosis not present

## 2021-05-17 DIAGNOSIS — Z923 Personal history of irradiation: Secondary | ICD-10-CM | POA: Insufficient documentation

## 2021-05-17 DIAGNOSIS — I509 Heart failure, unspecified: Secondary | ICD-10-CM | POA: Insufficient documentation

## 2021-05-17 DIAGNOSIS — Z85118 Personal history of other malignant neoplasm of bronchus and lung: Secondary | ICD-10-CM | POA: Diagnosis not present

## 2021-05-17 DIAGNOSIS — I2510 Atherosclerotic heart disease of native coronary artery without angina pectoris: Secondary | ICD-10-CM | POA: Diagnosis not present

## 2021-05-17 DIAGNOSIS — R599 Enlarged lymph nodes, unspecified: Secondary | ICD-10-CM | POA: Diagnosis not present

## 2021-05-17 DIAGNOSIS — I700 Atherosclerosis of aorta: Secondary | ICD-10-CM | POA: Insufficient documentation

## 2021-05-17 DIAGNOSIS — Z9981 Dependence on supplemental oxygen: Secondary | ICD-10-CM | POA: Diagnosis not present

## 2021-05-17 DIAGNOSIS — Z7982 Long term (current) use of aspirin: Secondary | ICD-10-CM | POA: Diagnosis not present

## 2021-05-17 DIAGNOSIS — J9611 Chronic respiratory failure with hypoxia: Secondary | ICD-10-CM | POA: Diagnosis not present

## 2021-05-17 DIAGNOSIS — I110 Hypertensive heart disease with heart failure: Secondary | ICD-10-CM | POA: Diagnosis not present

## 2021-05-17 HISTORY — PX: AXILLARY LYMPH NODE BIOPSY: SHX5737

## 2021-05-17 LAB — GLUCOSE, CAPILLARY: Glucose-Capillary: 87 mg/dL (ref 70–99)

## 2021-05-17 SURGERY — AXILLARY LYMPH NODE BIOPSY
Anesthesia: General | Site: Axilla | Laterality: Right

## 2021-05-17 MED ORDER — ONDANSETRON HCL 4 MG/2ML IJ SOLN
INTRAMUSCULAR | Status: AC
  Filled 2021-05-17: qty 2

## 2021-05-17 MED ORDER — FENTANYL CITRATE (PF) 100 MCG/2ML IJ SOLN: 25.0000 ug | INTRAMUSCULAR | Status: DC | PRN

## 2021-05-17 MED ORDER — LIDOCAINE 2% (20 MG/ML) 5 ML SYRINGE
INTRAMUSCULAR | Status: AC
  Filled 2021-05-17: qty 5

## 2021-05-17 MED ORDER — PROPOFOL 10 MG/ML IV BOLUS
INTRAVENOUS | Status: AC
  Filled 2021-05-17: qty 20

## 2021-05-17 MED ORDER — PHENYLEPHRINE 40 MCG/ML (10ML) SYRINGE FOR IV PUSH (FOR BLOOD PRESSURE SUPPORT)
PREFILLED_SYRINGE | INTRAVENOUS | Status: DC | PRN
  Administered 2021-05-17: 120 ug via INTRAVENOUS

## 2021-05-17 MED ORDER — LIDOCAINE 2% (20 MG/ML) 5 ML SYRINGE
INTRAMUSCULAR | Status: DC | PRN
  Administered 2021-05-17: 60 mg via INTRAVENOUS

## 2021-05-17 MED ORDER — HEMOSTATIC AGENTS (NO CHARGE) OPTIME
TOPICAL | Status: DC | PRN
  Administered 2021-05-17: 1 via TOPICAL

## 2021-05-17 MED ORDER — LACTATED RINGERS IV SOLN: INTRAVENOUS | Status: DC

## 2021-05-17 MED ORDER — PROPOFOL 10 MG/ML IV BOLUS
INTRAVENOUS | Status: DC | PRN
  Administered 2021-05-17: 130 mg via INTRAVENOUS

## 2021-05-17 MED ORDER — METHYLENE BLUE 0.5 % INJ SOLN
INTRAVENOUS | Status: AC
  Filled 2021-05-17: qty 10

## 2021-05-17 MED ORDER — ROCURONIUM BROMIDE 10 MG/ML (PF) SYRINGE
PREFILLED_SYRINGE | INTRAVENOUS | Status: AC
  Filled 2021-05-17: qty 10

## 2021-05-17 MED ORDER — ONDANSETRON HCL 4 MG/2ML IJ SOLN
INTRAMUSCULAR | Status: DC | PRN
  Administered 2021-05-17: 4 mg via INTRAVENOUS

## 2021-05-17 MED ORDER — CHLORHEXIDINE GLUCONATE 0.12 % MT SOLN: 15.0000 mL | Freq: Once | OROMUCOSAL | Status: AC

## 2021-05-17 MED ORDER — ACETAMINOPHEN 500 MG PO TABS
ORAL_TABLET | ORAL | Status: AC
  Administered 2021-05-17: 1000 mg via ORAL
  Filled 2021-05-17: qty 2

## 2021-05-17 MED ORDER — ORAL CARE MOUTH RINSE: 15.0000 mL | Freq: Once | OROMUCOSAL | Status: AC

## 2021-05-17 MED ORDER — CEFAZOLIN SODIUM-DEXTROSE 2-4 GM/100ML-% IV SOLN
2.0000 g | INTRAVENOUS | Status: AC
  Administered 2021-05-17: 2 g via INTRAVENOUS

## 2021-05-17 MED ORDER — CHLORHEXIDINE GLUCONATE CLOTH 2 % EX PADS: 6.0000 | MEDICATED_PAD | Freq: Once | CUTANEOUS | Status: DC

## 2021-05-17 MED ORDER — CHLORHEXIDINE GLUCONATE 0.12 % MT SOLN
OROMUCOSAL | Status: AC
  Administered 2021-05-17: 15 mL via OROMUCOSAL
  Filled 2021-05-17: qty 15

## 2021-05-17 MED ORDER — DEXAMETHASONE SODIUM PHOSPHATE 10 MG/ML IJ SOLN
INTRAMUSCULAR | Status: AC
  Filled 2021-05-17: qty 1

## 2021-05-17 MED ORDER — BUPIVACAINE-EPINEPHRINE 0.25% -1:200000 IJ SOLN
INTRAMUSCULAR | Status: DC | PRN
  Administered 2021-05-17: 3 mL
  Administered 2021-05-17: 10 mL

## 2021-05-17 MED ORDER — ONDANSETRON HCL 4 MG/2ML IJ SOLN: 4.0000 mg | Freq: Once | INTRAMUSCULAR | Status: DC | PRN

## 2021-05-17 MED ORDER — DEXAMETHASONE SODIUM PHOSPHATE 10 MG/ML IJ SOLN
INTRAMUSCULAR | Status: DC | PRN
  Administered 2021-05-17: 4 mg via INTRAVENOUS

## 2021-05-17 MED ORDER — CEFAZOLIN SODIUM-DEXTROSE 2-4 GM/100ML-% IV SOLN
INTRAVENOUS | Status: AC
  Filled 2021-05-17: qty 100

## 2021-05-17 MED ORDER — ACETAMINOPHEN 500 MG PO TABS: 1000.0000 mg | ORAL_TABLET | ORAL | Status: AC

## 2021-05-17 MED ORDER — HYDROCODONE-ACETAMINOPHEN 5-325 MG PO TABS: 1.0000 | ORAL_TABLET | Freq: Four times a day (QID) | ORAL | 0 refills | Status: DC | PRN

## 2021-05-17 MED ORDER — PHENYLEPHRINE HCL (PRESSORS) 10 MG/ML IV SOLN
INTRAVENOUS | Status: DC | PRN
  Administered 2021-05-17 (×2): 100 ug via INTRAVENOUS

## 2021-05-17 MED ORDER — BUPIVACAINE-EPINEPHRINE (PF) 0.25% -1:200000 IJ SOLN
INTRAMUSCULAR | Status: AC
  Filled 2021-05-17: qty 30

## 2021-05-17 SURGICAL SUPPLY — 40 items
ADH SKN CLS APL DERMABOND .7 (GAUZE/BANDAGES/DRESSINGS) ×1
BAG COUNTER SPONGE SURGICOUNT (BAG) ×2 IMPLANT
BAG SPNG CNTER NS LX DISP (BAG) ×1
BAG SURGICOUNT SPONGE COUNTING (BAG) ×1
CANISTER SUCT 3000ML PPV (MISCELLANEOUS) IMPLANT
CHLORAPREP W/TINT 10.5 ML (MISCELLANEOUS) ×3 IMPLANT
CNTNR URN SCR LID CUP LEK RST (MISCELLANEOUS) ×1 IMPLANT
CONT SPEC 4OZ STRL OR WHT (MISCELLANEOUS) ×3
COVER SURGICAL LIGHT HANDLE (MISCELLANEOUS) ×3 IMPLANT
COVER TRANSDUCER ULTRASND GEL (DISPOSABLE) ×3 IMPLANT
DECANTER SPIKE VIAL GLASS SM (MISCELLANEOUS) ×3 IMPLANT
DERMABOND ADVANCED (GAUZE/BANDAGES/DRESSINGS) ×2
DERMABOND ADVANCED .7 DNX12 (GAUZE/BANDAGES/DRESSINGS) ×1 IMPLANT
DRAPE LAPAROTOMY 100X72 PEDS (DRAPES) ×3 IMPLANT
ELECT COATED BLADE 2.86 ST (ELECTRODE) ×3 IMPLANT
ELECT REM PT RETURN 9FT ADLT (ELECTROSURGICAL) ×3
ELECTRODE REM PT RTRN 9FT ADLT (ELECTROSURGICAL) ×1 IMPLANT
GAUZE 4X4 16PLY ~~LOC~~+RFID DBL (SPONGE) ×3 IMPLANT
GLOVE SURG ENC MOIS LTX SZ7.5 (GLOVE) ×3 IMPLANT
GOWN STRL REUS W/ TWL LRG LVL3 (GOWN DISPOSABLE) ×2 IMPLANT
GOWN STRL REUS W/TWL LRG LVL3 (GOWN DISPOSABLE) ×6
KIT BASIN OR (CUSTOM PROCEDURE TRAY) ×3 IMPLANT
KIT TURNOVER KIT B (KITS) ×3 IMPLANT
NDL 18GX1X1/2 (RX/OR ONLY) (NEEDLE) ×1 IMPLANT
NDL FILTER BLUNT 18X1 1/2 (NEEDLE) IMPLANT
NDL HYPO 25GX1X1/2 BEV (NEEDLE) ×2 IMPLANT
NEEDLE 18GX1X1/2 (RX/OR ONLY) (NEEDLE) ×3 IMPLANT
NEEDLE FILTER BLUNT 18X 1/2SAF (NEEDLE)
NEEDLE FILTER BLUNT 18X1 1/2 (NEEDLE) IMPLANT
NEEDLE HYPO 25GX1X1/2 BEV (NEEDLE) ×6 IMPLANT
NS IRRIG 1000ML POUR BTL (IV SOLUTION) ×3 IMPLANT
PACK GENERAL/GYN (CUSTOM PROCEDURE TRAY) ×3 IMPLANT
PAD ARMBOARD 7.5X6 YLW CONV (MISCELLANEOUS) ×3 IMPLANT
PENCIL SMOKE EVACUATOR (MISCELLANEOUS) ×3 IMPLANT
SUT MNCRL AB 4-0 PS2 18 (SUTURE) ×3 IMPLANT
SUT VIC AB 3-0 SH 27 (SUTURE) ×3
SUT VIC AB 3-0 SH 27X BRD (SUTURE) ×1 IMPLANT
SYR CONTROL 10ML LL (SYRINGE) ×6 IMPLANT
TOWEL GREEN STERILE (TOWEL DISPOSABLE) ×3 IMPLANT
TOWEL GREEN STERILE FF (TOWEL DISPOSABLE) ×3 IMPLANT

## 2021-05-17 NOTE — Transfer of Care (Signed)
Immediate Anesthesia Transfer of Care Note  Patient: Jason Carter  Procedure(s) Performed: RIGHT AXILLARY LYMPH NODE BIOPSY (Right: Axilla)  Patient Location: PACU  Anesthesia Type:General  Level of Consciousness: awake, alert  and patient cooperative  Airway & Oxygen Therapy: Patient Spontanous Breathing and Patient connected to nasal cannula oxygen  Post-op Assessment: Report given to RN, Post -op Vital signs reviewed and stable and Patient moving all extremities X 4  Post vital signs: Reviewed and stable  Last Vitals:  Vitals Value Taken Time  BP 115/58 05/17/21 1252  Temp 36.2 C 05/17/21 1237  Pulse 64 05/17/21 1252  Resp 12 05/17/21 1252  SpO2 100 % 05/17/21 1252  Vitals shown include unvalidated device data.  Last Pain:  Vitals:   05/17/21 1237  TempSrc:   PainSc: Asleep         Complications: No notable events documented.

## 2021-05-17 NOTE — Op Note (Signed)
Preoperative diagnosis: Right axillary lymphadenopathy with weight loss  Postoperative diagnosis: Same  Procedure: Right axillary lymph node biopsy  Surgeon: Erroll Luna, MD  Anesthesia: LMA with 0.25% Marcaine plain  EBL: Minimal  Specimen: Right axillar lymph node level 1 deep node to pathology  Drains: None  Indications for procedure: The patient is a 75 year old male with unexplained weight loss and lymphadenopathy.  He was referred for evaluation of lymph node biopsy.  We discussed pros and cons of lymph node biopsy.  We discussed potential findings in treatment options as well.  He agreed to proceed.The procedure has been discussed with the patient.  Alternative therapies have been discussed with the patient.  Operative risks include bleeding,  Infection,  Organ injury,  Nerve injury,  Blood vessel injury,  DVT,   arm swelling, , pain, Pulmonary embolism,  Death,  And possible reoperation.  Medical management risks include worsening of present situation.  The success of the procedure is 50 -90 % at treating patients symptoms.  The patient understands and agrees to proceed.    Description of procedure: The patient was met in the holding area and questions answered.  Right axilla was marked as correct site.  He was taken to the operative room.  He is placed supine upon the OR table.  After induction of general esthesia, right axilla was prepped and draped in sterile fashion timeout performed.  Proper patient, site and procedure verified.  Local anesthetic was infiltrated along the inferior hairline of the right axilla.  A 3 cm incision was made.  Dissection was carried down and a large level 1 node ink was encountered.  It was excised in its entirety.  Hemostasis was achieved.  This was sent to pathology.  Irrigation was used.  Local anesthetic infiltrated.  Arista placed.  Wound closed with 3-0 Vicryl and 4 Monocryl.  Hemostasis achieved.  Dressing of Dermabond applied.  All counts found  to be correct.  The patient was awoke extubated taken to recovery in satisfactory condition.

## 2021-05-17 NOTE — Discharge Instructions (Signed)
#######################################################  GENERAL SURGERY: POST OP INSTRUCTIONS  ######################################################################  EAT Gradually transition to a high fiber diet with a fiber supplement over the next few weeks after discharge.  Start with a pureed / full liquid diet (see below)  WALK Walk an hour a day.  Control your pain to do that.    CONTROL PAIN Control pain so that you can walk, sleep, tolerate sneezing/coughing, go up/down stairs.  HAVE A BOWEL MOVEMENT DAILY Keep your bowels regular to avoid problems.  OK to try a laxative to override constipation.  OK to use an antidairrheal to slow down diarrhea.  Call if not better after 2 tries  CALL IF YOU HAVE PROBLEMS/CONCERNS Call if you are still struggling despite following these instructions. Call if you have concerns not answered by these instructions  ######################################################################    DIET: Follow a light bland diet & liquids the first 24 hours after arrival home, such as soup, liquids, starches, etc.  Be sure to drink plenty of fluids.  Quickly advance to a usual solid diet within a few days.  Avoid fast food or heavy meals as your are more likely to get nauseated or have irregular bowels.  A low-fat, high-fiber diet for the rest of your life is ideal.    Take your usually prescribed home medications unless otherwise directed.  PAIN CONTROL: Pain is best controlled by a usual combination of three different methods TOGETHER: Ice/Heat Over the counter pain medication Prescription pain medication Most patients will experience some swelling and bruising around the incisions.  Ice packs or heating pads (30-60 minutes up to 6 times a day) will help. Use ice for the first few days to help decrease swelling and bruising, then switch to heat to help relax tight/sore spots and speed recovery.  Some people prefer to use ice alone, heat alone,  alternating between ice & heat.  Experiment to what works for you.  Swelling and bruising can take several weeks to resolve.   It is helpful to take an over-the-counter pain medication regularly for the first few weeks.  Choose one of the following that works best for you: Naproxen (Aleve, etc)  Two 220mg  tabs twice a day Ibuprofen (Advil, etc) Three 200mg  tabs four times a day (every meal & bedtime) Acetaminophen (Tylenol, etc) 500-650mg  four times a day (every meal & bedtime) A  prescription for pain medication (such as oxycodone, hydrocodone, etc) should be given to you upon discharge.  Take your pain medication as prescribed.  If you are having problems/concerns with the prescription medicine (does not control pain, nausea, vomiting, rash, itching, etc), please call us [phone] to see if we need to switch you to a different pain medicine that will work better for you and/or control your side effect better. If you need a refill on your pain medication, please contact your pharmacy.  They will contact our office to request authorization. Prescriptions will not be filled after 5 pm or on week-ends.  Avoid getting constipated.  Between the surgery and the pain medications, it is common to experience some constipation.  Increasing fluid intake and taking a fiber supplement (such as Metamucil, Citrucel, FiberCon, MiraLax, etc) 1-2 times a day regularly will usually help prevent this problem from occurring.  A mild laxative (prune juice, Milk of Magnesia, MiraLax, etc) should be taken according to package directions if there are no bowel movements after 48 hours.   Watch out for diarrhea.  If you have many loose bowel movements, simplify your  diet to bland foods & liquids for a few days.  Stop any stool softeners and decrease your fiber supplement.  Switching to mild anti-diarrheal medications (Loperamide/Imodium, Kayopectate, Pepto Bismol) can help.  If this worsens or does not improve, please call  us.  Wash / shower every day.  You may shower over the dressings as they are waterproof.  Continue to shower over incision(s) after the dressing is off. Remove your waterproof bandages 5 days after surgery.  You may leave the incision open to air.  You may have skin tapes (Steri Strips) covering the incision(s).  Leave them on until one week, then remove.  You may replace a dressing/Band-Aid to cover the incision for comfort if you wish.   ACTIVITIES as tolerated:   You may resume regular (light) daily activities beginning the next day--such as daily self-care, walking, climbing stairs--gradually increasing activities as tolerated.  If you can walk 30 minutes without difficulty, it is safe to try more intense activity such as jogging, treadmill, bicycling, low-impact aerobics, swimming, etc. Save the most intensive and strenuous activity for last such as sit-ups, heavy lifting, contact sports, etc  Refrain from any heavy lifting or straining until you are off narcotics for pain control.   DO NOT PUSH THROUGH PAIN.  Let pain be your guide: If it hurts to do something, don't do it.  Pain is your body warning you to avoid that activity for another week until the pain goes down. You may drive when you are no longer taking prescription pain medication, you can comfortably wear a seatbelt, and you can safely maneuver your car and apply brakes. You may have sexual intercourse when it is comfortable.   FOLLOW UP in our office Please call [facility] at [phone] to set up an appointment to see your surgeon in the office for a follow-up appointment approximately 2-3 weeks after your surgery. Make sure that you call for this appointment the day you arrive home to insure a convenient appointment time.  9. IF YOU HAVE DISABILITY OR FAMILY LEAVE FORMS, BRING THEM TO THE OFFICE FOR PROCESSING.  DO NOT GIVE THEM TO YOUR DOCTOR.   WHEN TO CALL us [phone]: Poor pain control Reactions / problems with new  medications (rash/itching, nausea, etc)  Fever over 101.5 F (38.5 C) Worsening swelling or bruising Continued bleeding from incision. Increased pain, redness, or drainage from the incision Difficulty breathing / swallowing   The clinic staff is available to answer your questions during regular business hours (8:30am-5pm).  Please don't hesitate to call and ask to speak to one of our nurses for clinical concerns.   If you have a medical emergency, go to the nearest emergency room or call 911.  A surgeon from [facility] is always on call at the [facility], [place], [place], [place],   [id number] ? MAIN: [phone] ? TOLL FREE: [phone] ?  FAX [phone] [email or web address]  #######################################################

## 2021-05-17 NOTE — Anesthesia Procedure Notes (Signed)
Procedure Name: Intubation Date/Time: 05/17/2021 12:01 PM Performed by: Annamary Carolin, CRNA Pre-anesthesia Checklist: Patient identified, Emergency Drugs available, Suction available and Patient being monitored Patient Re-evaluated:Patient Re-evaluated prior to induction Oxygen Delivery Method: Circle System Utilized Preoxygenation: Pre-oxygenation with 100% oxygen Induction Type: IV induction Ventilation: Mask ventilation without difficulty LMA: LMA inserted LMA Size: 4.0 Tube type: Oral Number of attempts: 1 Placement Confirmation: ETT inserted through vocal cords under direct vision, positive ETCO2 and breath sounds checked- equal and bilateral Tube secured with: Tape Dental Injury: Teeth and Oropharynx as per pre-operative assessment

## 2021-05-17 NOTE — H&P (Signed)
History of Present Illness: Jason Carter is a 75 y.o. male who is seen today as an office consultation at the request of Dr. Dara Lords for evaluation of No chief complaint on file. .   Patient sent at the request of [name], PA for evaluation of right axillary lymphadenopathy. He has a history of right lung cancer treated with radiation therapy. That disease is stable bone proceed follow-up CT scan was noted to have multiple enlarged right axillary lymph nodes. Core biopsy was nondiagnostic and excisional biopsy is recommended. He is relatively debilitated. He is on home oxygen but he gets around pretty well on his own. He does take his oxygen off at home sometimes to help improve his stamina. He has lost about 20 to 30 pounds over 6 months during this time of treatment. Denies night sweats or any fevers. He does have some pain in his right wrist that he injured after changing a lawnmower tire about 9 months ago. He does have a chronic cough which is mild. He was a former smoker.  Review of Systems: A complete review of systems was obtained from the patient. I have reviewed this information and discussed as appropriate with the patient. See HPI as well for other ROS.    Medical History: Past Medical History:  Diagnosis Date   Anemia   Arthritis   CHF (congestive heart failure) (CMS-HCC)   COPD (chronic obstructive pulmonary disease) (CMS-HCC)   History of cancer   There is no problem list on file for this patient.  Past Surgical History:  Procedure Laterality Date   APPENDECTOMY    No Known Allergies  Current Outpatient Medications on File Prior to Visit  Medication Sig Dispense Refill   albuterol 90 mcg/actuation inhaler Inhale into the lungs   aspirin 81 MG EC tablet Take by mouth   bisoprolol (ZEBETA) 5 MG tablet Take 2.5 mg by mouth once daily   diclofenac (VOLTAREN) 1 % topical gel Apply 2 g topically 4 (four) times daily   fluticasone propionate (FLONASE) 50  mcg/actuation nasal spray SHAKE LIQUID AND USE 1 SPRAY IN EACH NOSTRIL DAILY   multivitamin tablet Take 1 tablet by mouth once daily   No current facility-administered medications on file prior to visit.   Family History  Problem Relation Age of Onset   High blood pressure (Hypertension) Mother    Social History   Tobacco Use  Smoking Status Former Smoker  Smokeless Tobacco Never Used    Social History   Socioeconomic History   Marital status: Widowed  Tobacco Use   Smoking status: Former Smoker   Smokeless tobacco: Never Used  Substance and Sexual Activity   Alcohol use: Yes   Drug use: Never   Objective:   Vitals:  04/17/21 1513  BP: 112/60  Pulse: 68  Weight: 63.1 kg (139 lb 3.2 oz)  Height: 170.2 cm (_0 )   Body mass index is 21.8 kg/m.  Physical Exam Constitutional:  Appearance: Normal appearance.  HENT:  Head: Normocephalic.  Nose: Nose normal.  Comments: o 2 Bright Eyes:  Pupils: Pupils are equal, round, and reactive to light.  Cardiovascular:  Rate and Rhythm: Normal rate.  Pulmonary:  Effort: Pulmonary effort is normal.  Breath sounds: No stridor.  Musculoskeletal:  General: Normal range of motion.  Cervical back: Normal range of motion and neck supple.  Lymphadenopathy:  Upper Body:  Right upper body: Axillary adenopathy present. No supraclavicular adenopathy.  Left upper body: No supraclavicular or axillary adenopathy.  Skin: General: Skin is warm and dry.  Neurological:  General: No focal deficit present.  Mental Status: He is alert and oriented to person, place, and time.  Psychiatric:  Mood and Affect: Mood normal.  Behavior: Behavior normal.     Labs, Imaging and Diagnostic Testing: FINAL MICROSCOPIC DIAGNOSIS:   A. LYMPH NODE, RIGHT AXILLARY, NEEDLE CORE BIOPSY:  -No metastatic carcinoma identified  -See comment   COMMENT:   The sections show several small needle core biopsy fragments of lymph  nodal tissue displaying a  mixture of small lymphoid cells and abundant  plasma cells in the form of numerous variably sized aggregates and  sheets.  Small areas representing germinal centers are seen. .No  metastatic carcinoma or granulomata identified.   A battery of  immunohistochemical stains was performed with appropriate controls.  No  significant staining is seen with cytokeratin AE1/AE3, cytokeratin 7 or  TTF-1.  To evaluate the lymphoid component, lymphoid markers including  CD3, CD5, CD20, PAX5, CD10, Bcl-2, BCL6, CD138, cyclin D1, in addition  to in situ hybridization for kappa and lambda were performed.  The  lymphoid component shows a mixture of T and B cells in their apparent  respective compartments.  There is no apparent co-expression of CD5 in  B-cell areas.  CD10 and BCL6 highlight small areas of positivity likely  related to germinal centers.  The latter are Bcl-2 negative.  Cyclin D1  is negative although the tissue is somewhat exhausted on deeper  sectioning.  CD138 highlights the abundant plasma cell component which  shows polyclonal staining pattern for kappa and lambda light chains  although there is kappa light chain excess.  The lymphoid changes are  not considered specific but there is no definitive or diagnostic  evidence of a lymphoproliferative process.  Nonetheless, excisional  biopsy is recommended for further evaluation.  CLINICAL DATA: Non-small cell lung cancer, XRT complete   EXAM: CT CHEST WITH CONTRAST   TECHNIQUE: Multidetector CT imaging of the chest was performed during intravenous contrast administration.   CONTRAST: <See Chart> OMNIPAQUE IOHEXOL 300 MG/ML SOLN, 56m OMNIPAQUE IOHEXOL 350 MG/ML SOLN   COMPARISON: 11/06/2020, 09/11/2020   FINDINGS: Cardiovascular: Aortic atherosclerosis. Normal heart size. Left coronary artery calcifications. No pericardial effusion.   Mediastinum/Nodes: There are multiple abnormally enlarged right axillary lymph nodes,  significantly increased in size compared to prior examination, largest nodes measuring 1.9 x 1.7 cm, previously 1.3 x 1.0 cm (series 2, image 31). Small calcified mediastinal and hilar lymph nodes. Thyroid gland, trachea, and esophagus demonstrate no significant findings.   Lungs/Pleura: Moderate to severe centrilobular emphysema. Stable to slightly diminished size of a spiculated nodule in the left pulmonary apex, measuring approximately 0.9 x 0.7 cm, previously 1.1 x 0.9 cm when measured similarly (series 5, image 19). Benign, calcified clustered nodules of the superior segment left lower lobe abutting the fissure (series 5, image 63). Occasional small, noncalcified pulmonary nodules are stable, for example a 3 mm nodule of the dependent left lower lobe (series 5, image 101). No pleural effusion or pneumothorax.   Upper Abdomen: No acute abnormality.   Musculoskeletal: No chest wall mass or suspicious bone lesions identified. Chronic, displaced fracture deformity or unusual ossification variant of the mid sternal body (series 7, image 93).   IMPRESSION: 1. Stable to slightly diminished size of a spiculated nodule in the left pulmonary apex, consistent with expected evolution following radiation therapy. 2. There are multiple abnormally enlarged right axillary lymph nodes, significantly increased in size compared  to prior examination, largest nodes measuring 1.9 x 1.7 cm, previously 1.3 x 1.0 cm. Although generally concerning for nodal metastatic disease, this would be an unusual isolated manifestation of metastatic lung cancer. Consider tissue sampling. 3. Occasional small, noncalcified pulmonary nodules are stable, almost certainly benign incidental sequelae of prior infection or inflammation. Continued attention on follow-up. 4. Emphysema. 5. Coronary artery disease.   Aortic Atherosclerosis (ICD10-I70.0) and Emphysema (ICD10-J43.9).     Electronically Signed By: Eddie Candle M.D. On: 03/13/2021 09:47 Assessment and Plan:  Diagnoses and all orders for this visit:  Lymphadenopathy, axillary    Discussed the pros and cons of right axillary lymph node biopsy. He has lost weight which is concerning to me. We discussed surgery and trying to limit his anesthesia. Certainly this can be done under MAC with local and conscious sedation if needed. Described incision to remove 1 lymph node to have this further tested. Risks and benefits of surgery as well as observation discussed in the circumstance given his overall health. He has agreed to proceed with right axillar lymph node biopsy. Risk of bleeding, infection, lymphocele, pain, numbness, exacerbation of underlying medical problems, cardiac issues, pulmonary issues, death, DVT, and worsening of his overall outcome discussed. I think these risks are quite small for this procedure but I want to make him aware of this. He voices understanding and agreed to proceed.  No follow-ups on file.  Kennieth Francois, MD

## 2021-05-17 NOTE — Interval H&P Note (Signed)
History and Physical Interval Note:  05/17/2021 9:39 AM  Jason Carter  has presented today for surgery, with the diagnosis of LYMPHADENOPATHY.  The various methods of treatment have been discussed with the patient and family. After consideration of risks, benefits and other options for treatment, the patient has consented to  Procedure(s): RIGHT AXILLARY LYMPH NODE BIOPSY (Right) as a surgical intervention.  The patient's history has been reviewed, patient examined, no change in status, stable for surgery.  I have reviewed the patient's chart and labs.  Questions were answered to the patient's satisfaction.     [place]

## 2021-05-18 ENCOUNTER — Encounter (HOSPITAL_COMMUNITY): Payer: Self-pay | Admitting: Surgery

## 2021-05-18 NOTE — Anesthesia Postprocedure Evaluation (Signed)
Anesthesia Post Note  Patient: Jason Carter  Procedure(s) Performed: RIGHT AXILLARY LYMPH NODE BIOPSY (Right: Axilla)     Patient location during evaluation: PACU Anesthesia Type: General Level of consciousness: awake and alert Pain management: pain level controlled Vital Signs Assessment: post-procedure vital signs reviewed and stable Respiratory status: spontaneous breathing, nonlabored ventilation, respiratory function stable and patient connected to nasal cannula oxygen Cardiovascular status: blood pressure returned to baseline and stable Postop Assessment: no apparent nausea or vomiting Anesthetic complications: no   No notable events documented.  Last Vitals:  Vitals:   05/17/21 1309 05/17/21 1315  BP: 120/61 113/70  Pulse: 72 74  Resp: 13 13  Temp: (!) 36.2 C   SpO2: 99% 100%    Last Pain:  Vitals:   05/17/21 1309  TempSrc:   PainSc: 0-No pain                 Catalina Gravel

## 2021-05-22 LAB — SURGICAL PATHOLOGY

## 2021-05-23 DIAGNOSIS — Z23 Encounter for immunization: Secondary | ICD-10-CM | POA: Diagnosis not present

## 2021-05-23 LAB — SURGICAL PATHOLOGY

## 2021-06-05 ENCOUNTER — Telehealth: Payer: Self-pay | Admitting: Radiation Oncology

## 2021-06-05 NOTE — Telephone Encounter (Signed)
I called and reviewed the results of his node resection and our plans would be to continue to follow him at our previously planned interval. He is in agreement with his scan due in February 2023. He will call sooner if he has concerns prior to his next visit.

## 2021-07-06 DIAGNOSIS — H15012 Anterior scleritis, left eye: Secondary | ICD-10-CM | POA: Diagnosis not present

## 2021-07-11 ENCOUNTER — Telehealth: Payer: Self-pay | Admitting: *Deleted

## 2021-07-11 NOTE — Telephone Encounter (Signed)
Patient called in stating for the last few days he has noted the 3rd digit on left hand has been numb and tingling. Some involvement with index finger and thumb. Also, notes some non-radiating chest discomfort with deep inspiration only. Denies SHOB. O2 sat has been 95-98% on his usual 2 liters. HR 60-72. BP today 116/77 and 119/79. Patient was given appt with Red Team tomorrow at [phone]. He is aware to head to ED if chest pain increases, radiates or develops SHOB.

## 2021-07-12 ENCOUNTER — Other Ambulatory Visit: Payer: Self-pay

## 2021-07-12 ENCOUNTER — Ambulatory Visit (HOSPITAL_COMMUNITY)
Admission: RE | Admit: 2021-07-12 | Discharge: 2021-07-12 | Disposition: A | Discharge status: Home / Self Care | Payer: Medicare Other | Source: Ambulatory Visit | Attending: Internal Medicine | Admitting: Internal Medicine

## 2021-07-12 ENCOUNTER — Encounter (HOSPITAL_BASED_OUTPATIENT_CLINIC_OR_DEPARTMENT_OTHER): Payer: Self-pay

## 2021-07-12 ENCOUNTER — Encounter: Payer: Self-pay | Admitting: Internal Medicine

## 2021-07-12 ENCOUNTER — Ambulatory Visit (INDEPENDENT_AMBULATORY_CARE_PROVIDER_SITE_OTHER): Payer: Medicare Other | Admitting: Internal Medicine

## 2021-07-12 ENCOUNTER — Emergency Department (HOSPITAL_BASED_OUTPATIENT_CLINIC_OR_DEPARTMENT_OTHER)
Admission: EM | Admit: 2021-07-12 | Discharge: 2021-07-12 | Disposition: A | Discharge status: Home / Self Care | Payer: Medicare Other | Attending: Emergency Medicine | Admitting: Emergency Medicine

## 2021-07-12 ENCOUNTER — Emergency Department (HOSPITAL_BASED_OUTPATIENT_CLINIC_OR_DEPARTMENT_OTHER): Payer: Medicare Other

## 2021-07-12 VITALS — BP 107/57 | HR 72 | Temp 97.7°F | Ht 67.0 in | Wt 136.1 lb

## 2021-07-12 DIAGNOSIS — J439 Emphysema, unspecified: Secondary | ICD-10-CM | POA: Diagnosis not present

## 2021-07-12 DIAGNOSIS — R9431 Abnormal electrocardiogram [ECG] [EKG]: Secondary | ICD-10-CM | POA: Diagnosis not present

## 2021-07-12 DIAGNOSIS — I700 Atherosclerosis of aorta: Secondary | ICD-10-CM | POA: Diagnosis not present

## 2021-07-12 DIAGNOSIS — G5612 Other lesions of median nerve, left upper limb: Secondary | ICD-10-CM | POA: Diagnosis not present

## 2021-07-12 DIAGNOSIS — M7989 Other specified soft tissue disorders: Secondary | ICD-10-CM | POA: Diagnosis not present

## 2021-07-12 DIAGNOSIS — R0789 Other chest pain: Secondary | ICD-10-CM | POA: Diagnosis not present

## 2021-07-12 DIAGNOSIS — R079 Chest pain, unspecified: Secondary | ICD-10-CM

## 2021-07-12 DIAGNOSIS — Z79899 Other long term (current) drug therapy: Secondary | ICD-10-CM | POA: Diagnosis not present

## 2021-07-12 DIAGNOSIS — Z7951 Long term (current) use of inhaled steroids: Secondary | ICD-10-CM | POA: Insufficient documentation

## 2021-07-12 DIAGNOSIS — Z7982 Long term (current) use of aspirin: Secondary | ICD-10-CM | POA: Insufficient documentation

## 2021-07-12 DIAGNOSIS — Z20822 Contact with and (suspected) exposure to covid-19: Secondary | ICD-10-CM | POA: Diagnosis not present

## 2021-07-12 DIAGNOSIS — M25531 Pain in right wrist: Secondary | ICD-10-CM

## 2021-07-12 DIAGNOSIS — Z85118 Personal history of other malignant neoplasm of bronchus and lung: Secondary | ICD-10-CM | POA: Diagnosis not present

## 2021-07-12 DIAGNOSIS — Z0000 Encounter for general adult medical examination without abnormal findings: Secondary | ICD-10-CM

## 2021-07-12 DIAGNOSIS — Z8616 Personal history of COVID-19: Secondary | ICD-10-CM | POA: Diagnosis not present

## 2021-07-12 DIAGNOSIS — I509 Heart failure, unspecified: Secondary | ICD-10-CM | POA: Insufficient documentation

## 2021-07-12 DIAGNOSIS — Z87891 Personal history of nicotine dependence: Secondary | ICD-10-CM | POA: Diagnosis not present

## 2021-07-12 DIAGNOSIS — I2510 Atherosclerotic heart disease of native coronary artery without angina pectoris: Secondary | ICD-10-CM | POA: Diagnosis not present

## 2021-07-12 DIAGNOSIS — I110 Hypertensive heart disease with heart failure: Secondary | ICD-10-CM | POA: Insufficient documentation

## 2021-07-12 DIAGNOSIS — J9611 Chronic respiratory failure with hypoxia: Secondary | ICD-10-CM | POA: Diagnosis not present

## 2021-07-12 DIAGNOSIS — R918 Other nonspecific abnormal finding of lung field: Secondary | ICD-10-CM | POA: Diagnosis not present

## 2021-07-12 LAB — CBC WITH DIFFERENTIAL/PLATELET
Abs Immature Granulocytes: 0.01 10*3/uL (ref 0.00–0.07)
Basophils Absolute: 0 10*3/uL (ref 0.0–0.1)
Basophils Relative: 0 %
Eosinophils Absolute: 0.3 10*3/uL (ref 0.0–0.5)
Eosinophils Relative: 4 %
HCT: 38.8 % — ABNORMAL LOW (ref 39.0–52.0)
Hemoglobin: 12 g/dL — ABNORMAL LOW (ref 13.0–17.0)
Immature Granulocytes: 0 %
Lymphocytes Relative: 10 %
Lymphs Abs: 0.8 10*3/uL (ref 0.7–4.0)
MCH: 27.7 pg (ref 26.0–34.0)
MCHC: 30.9 g/dL (ref 30.0–36.0)
MCV: 89.6 fL (ref 80.0–100.0)
Monocytes Absolute: 0.6 10*3/uL (ref 0.1–1.0)
Monocytes Relative: 8 %
Neutro Abs: 5.7 10*3/uL (ref 1.7–7.7)
Neutrophils Relative %: 78 %
Platelets: 243 10*3/uL (ref 150–400)
RBC: 4.33 MIL/uL (ref 4.22–5.81)
RDW: 14.3 % (ref 11.5–15.5)
WBC: 7.5 10*3/uL (ref 4.0–10.5)
nRBC: 0 % (ref 0.0–0.2)

## 2021-07-12 LAB — BASIC METABOLIC PANEL
Anion gap: 8 (ref 5–15)
BUN: 20 mg/dL (ref 8–23)
CO2: 33 mmol/L — ABNORMAL HIGH (ref 22–32)
Calcium: 9.4 mg/dL (ref 8.9–10.3)
Chloride: 99 mmol/L (ref 98–111)
Creatinine, Ser: 0.63 mg/dL (ref 0.61–1.24)
GFR, Estimated: >60 mL/min (ref >60)
Glucose, Bld: 113 mg/dL — ABNORMAL HIGH (ref 70–99)
Potassium: 4.2 mmol/L (ref 3.5–5.1)
Sodium: 140 mmol/L (ref 135–145)

## 2021-07-12 LAB — D-DIMER, QUANTITATIVE: D-Dimer, Quant: 3.02 ug/mL-FEU — ABNORMAL HIGH (ref 0.00–0.50)

## 2021-07-12 LAB — RESP PANEL BY RT-PCR (FLU A&B, COVID) ARPGX2
Influenza A by PCR: NEGATIVE
Influenza B by PCR: NEGATIVE
SARS Coronavirus 2 by RT PCR: NEGATIVE

## 2021-07-12 LAB — TROPONIN I (HIGH SENSITIVITY)
Troponin I (High Sensitivity): 3 ng/L (ref <18)
Troponin I (High Sensitivity): 3 ng/L (ref <18)

## 2021-07-12 LAB — BRAIN NATRIURETIC PEPTIDE: B Natriuretic Peptide: 145.7 pg/mL — ABNORMAL HIGH (ref 0.0–100.0)

## 2021-07-12 MED ORDER — IOHEXOL 350 MG/ML SOLN
75.0000 mL | Freq: Once | INTRAVENOUS | Status: AC | PRN
  Administered 2021-07-12: 16:00:00 75 mL via INTRAVENOUS

## 2021-07-12 NOTE — Discharge Instructions (Signed)
Your work-up today did not show any blood clot.  Please follow-up with your internist and your oncologist as we discussed.  If things change or worsen return to the ED.
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
## 2021-07-12 NOTE — ED Triage Notes (Signed)
Pt reports intermittent chest pain since yesterday. States pain is worse when he takes a deep breath. Pt was sent to ed by pcp due to elevated d-dimer
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
## 2021-07-12 NOTE — Patient Instructions (Addendum)
[name], it was a pleasure seeing you today! You endorsed feeling well today. Below are some of the things we talked about this visit. We look forward to seeing you in the follow up appointment!  Today we discussed: You presented with chest discomfort yesterday. It was happening when you took deep breath in. It has resolved now. We are ordering some blood test to see if it could be due to a clot. We will give you a call tomorrow with the results. If it recurs and your pain worsens or you get shortness of breath, please go to the ED. We performed an EKG and it didn't show a cause for your pain you had yesterday. We will do an Echo to see how you heart.  For your left finger numbness, please use a wrist splint.   For your right wrist pain, we will check a uric acid level and repeat x-ray.   I have ordered the following labs today:  Lab Orders         Uric acid         D-dimer, quantitative (not at [facility])       Referrals ordered today:   Referral Orders  No referral(s) requested today     I have ordered the following medication/changed the following medications:   Stop the following medications: There are no discontinued medications.   Start the following medications: No orders of the defined types were placed in this encounter.    Follow-up: 3 months with PCP  Please make sure to arrive 15 minutes prior to your next appointment. If you arrive late, you may be asked to reschedule.   We look forward to seeing you next time. Please call our clinic at [phone] if you have any questions or concerns. The best time to call is Monday-Friday from 9am-4pm, but there is someone available 24/7. If after hours or the weekend, call the main hospital number and ask for the Internal Medicine Resident On-Call. If you need medication refills, please notify your pharmacy one week in advance and they will send Jason Carter a request.  Thank you for letting Jason Carter take part in your care. Wishing you the  best!  Thank you, Idamae Schuller, MD

## 2021-07-13 LAB — URIC ACID: Uric Acid: 4.9 mg/dL (ref 3.8–8.4)

## 2021-07-14 DIAGNOSIS — R079 Chest pain, unspecified: Secondary | ICD-10-CM | POA: Insufficient documentation

## 2021-07-14 DIAGNOSIS — Z0000 Encounter for general adult medical examination without abnormal findings: Secondary | ICD-10-CM | POA: Insufficient documentation

## 2021-07-14 DIAGNOSIS — G5610 Other lesions of median nerve, unspecified upper limb: Secondary | ICD-10-CM | POA: Insufficient documentation

## 2021-07-14 NOTE — Assessment & Plan Note (Signed)
Patient has distal left median nerve neuropathy. It is affecting his digits only. Tinel and Phalen test are negative. Applying pressure to both wrist did produce numbness in the left fingers consistent with carpel tunnel syndrome.  -Advised patient to wear watch on right wrist and wear a wrist splint for relieve.  -If patient continues to have this, will consider further workup.

## 2021-07-14 NOTE — Assessment & Plan Note (Signed)
Patient was discharged with oxgyen in April 2022 and has been using it since then. He states he tries to wean himself off the oxygen and can tolerate periods of 8 hours without it but then places it back when going to sleep since he is unable to monitor himself at that time.  -6 MWT at next PCP visit to check for continued need for oxygen.

## 2021-07-14 NOTE — Assessment & Plan Note (Signed)
Patient has history of cancer and has undergone radiation therapy. He is on 2L oxygen since leaving the hospital for atrial flutter. Chest pain with inspiration that was reproducible is concerning for PE. The pain did not occur today which was reassuring. Other DDx included MSK causes for the pain. Most concerning was a PE. He had low probability for a PE, so a d-dimer was performed to rule it out. It came back elevated at 3.02. -EKG performed which showed inverted p-waves but no signs of ischemia or right heart strain. -Advised patient to report to [facility] for further a CTA to rule out a PE as D-Dimer was elevated  Addendum: CTA did not show a PE.

## 2021-07-14 NOTE — Assessment & Plan Note (Addendum)
Patient continued to complain of right wrist pain. Stated it improves with voltaren gel and was relieved by the short course of Norco that was prescribed. Physical exam shows some calor at right wrist with swelling. He does have hx of gout and does not have any active flare so we will obtain a uric acid level. We will obtain an X ray of the right wrist at this time. Advised continued conservative management.  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 Will inform patient of the results and have him follow up for additional testing to confirm this.

## 2021-07-16 ENCOUNTER — Other Ambulatory Visit: Payer: Self-pay | Admitting: Radiation Oncology

## 2021-07-16 DIAGNOSIS — C3412 Malignant neoplasm of upper lobe, left bronchus or lung: Secondary | ICD-10-CM

## 2021-07-16 NOTE — Progress Notes (Signed)
°  Radiation Oncology         [phone] ________________________________  Name: Jason Carter MRN: [id number]  Date: 07/16/2021  DOB: 1946-02-07  Chart Note:  This patient was in the ED and had a Chest CT showing enlarging right axillary adenopathy.  He was advised to follow-up with his rad-onc and tried to call Shona Simpson, our rad-onc PA, and mis-dialed reaching me in my office.  I talked to him about his recent experience, reviewed his Chest CT and rad-onc records  The recent Chest CT report suggested a PET-CT to further characterizing enlarrging right axillary LAN and re-stage his lung cancer.  In order to expedite care, I offered to place the order for the PET-CT, so it would be available to [facility] and Dr. Lisbeth Renshaw after the holidays.  He was in agreement with that and I ordered the PET today.  He also described some dyspnea reporting pO2 in the  95-98% range on 2L O2 by [place].  I advised him to follow-up with his primary care, [facility] about that.  ________________________________  Sheral Apley. Tammi Klippel, M.D.

## 2021-07-17 ENCOUNTER — Telehealth: Payer: Self-pay

## 2021-07-17 ENCOUNTER — Telehealth: Payer: Self-pay | Admitting: *Deleted

## 2021-07-17 NOTE — Telephone Encounter (Signed)
CALLED PATIENT TO INFORM OF PET SCAN FOR 07-31-21- ARRIVAL TIME- 12:30 PM @ [facility], PATIENT TO HAVE WATER ONLY- 6 HRS. PRIOR TO TEST, LVM FOR A RETURN CALL

## 2021-07-17 NOTE — Telephone Encounter (Signed)
Patient wants to follow-up w/ Shona Simpson, PA-C, post seeing Dr. Humphrey Rolls PCP at [facility]. He is asking to speak w/ her directly. He would like to share his thoughts, concerns, and, questions on his most recent visit & scans. He's feeling good and very optimistic about his outcomes. I told him that I would deliver this message to Shona Simpson PA-C, Dr. Lisbeth Renshaw & team. Patient is aware that Prince Solian is not in the office at this time but that she will get back to him as soon as possible upon return. I left my extension for patient to call if needed.

## 2021-07-24 NOTE — Progress Notes (Signed)
Internal Medicine Clinic Attending  I saw and evaluated the patient.  I personally confirmed the key portions of the history and exam documented by Dr. Humphrey Rolls and I reviewed pertinent patient test results.  The assessment, diagnosis, and plan were formulated together and I agree with the documentation in the residents note. Multiple issues addressed today, however most concerning was patient report of pleuritic chest pain over the last several days. EKG with possible left atrial enlargement, negative for ischemia. No lower extremity s/sx of DVT although given patient's history of cancer, increased sedentary activity, and low risk Well's score, D-dimer obtained to rule out PE. Other possible etiologies include costochondritis or other MSK pain. Chronic right wrist pain, tender on examination. Previous evaluation earlier this year not specific. Unlikely acute gout flare given time course. Will obtain XR and consider referral to ortho for aspiration.

## 2021-07-31 ENCOUNTER — Other Ambulatory Visit: Payer: Self-pay

## 2021-07-31 ENCOUNTER — Ambulatory Visit (HOSPITAL_COMMUNITY)
Admission: RE | Admit: 2021-07-31 | Discharge: 2021-07-31 | Disposition: A | Discharge status: Home / Self Care | Payer: Medicare Other | Source: Ambulatory Visit | Attending: Radiation Oncology | Admitting: Radiation Oncology

## 2021-07-31 DIAGNOSIS — C3412 Malignant neoplasm of upper lobe, left bronchus or lung: Secondary | ICD-10-CM | POA: Insufficient documentation

## 2021-07-31 DIAGNOSIS — C3490 Malignant neoplasm of unspecified part of unspecified bronchus or lung: Secondary | ICD-10-CM | POA: Diagnosis not present

## 2021-07-31 LAB — GLUCOSE, CAPILLARY: Glucose-Capillary: 96 mg/dL (ref 70–99)

## 2021-07-31 MED ORDER — FLUDEOXYGLUCOSE F - 18 (FDG) INJECTION
6.5000 | Freq: Once | INTRAVENOUS | Status: AC | PRN
  Administered 2021-07-31: 7.08 via INTRAVENOUS

## 2021-08-07 ENCOUNTER — Ambulatory Visit (INDEPENDENT_AMBULATORY_CARE_PROVIDER_SITE_OTHER): Payer: Medicare Other | Admitting: Internal Medicine

## 2021-08-07 ENCOUNTER — Telehealth: Payer: Self-pay | Admitting: Radiation Oncology

## 2021-08-07 ENCOUNTER — Encounter: Payer: Self-pay | Admitting: Internal Medicine

## 2021-08-07 VITALS — BP 114/49 | HR 79 | Temp 98.0°F | Wt 135.8 lb

## 2021-08-07 DIAGNOSIS — R079 Chest pain, unspecified: Secondary | ICD-10-CM | POA: Diagnosis not present

## 2021-08-07 DIAGNOSIS — C3412 Malignant neoplasm of upper lobe, left bronchus or lung: Secondary | ICD-10-CM | POA: Diagnosis not present

## 2021-08-07 DIAGNOSIS — M1990 Unspecified osteoarthritis, unspecified site: Secondary | ICD-10-CM | POA: Diagnosis not present

## 2021-08-07 DIAGNOSIS — M25531 Pain in right wrist: Secondary | ICD-10-CM
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
## 2021-08-07 NOTE — Patient Instructions (Signed)
Thank you, Mr.Daud Frary for allowing [place] to provide your care today. Today we discussed:  Wrist pain: Your x-ray showed some bone changes that would be consistent with an inflammatory arthritis, rheumatoid arthritis is 1 kind of inflammatory arthritis.  Today we will collect some blood work to test for rheumatoid arthritis.  I will also refer you to see an orthopedic specialist to collect some fluid from the wrist and take a look at it under the microscope.  This can help [place] figure out if this is gout or something else.  For pain continue taking the Voltaren gel and you can take ibuprofen 800 mg twice a day as needed.  Pleuritic chest pain: Your chest pain with deep breaths sounds like pain related to pleurisy, as you mentioned.  The ibuprofen can help with this pain as well.  Continue to follow with your oncologist.  I have ordered the following labs for you:   Lab Orders         Rheumatoid (RA) Factor         CYCLIC CITRUL PEPTIDE ANTIBODY, IGG/IGA       My Chart Access: [email or web address]  Please follow-up as needed if you have worsening wrist or chest pain, a rash forms, or you have any other side effects from starting the ibuprofen.  Please make sure to arrive 15 minutes prior to your next appointment. If you arrive late, you may be asked to reschedule.    We look forward to seeing you next time. Please call our clinic at [phone] if you have any questions or concerns. The best time to call is Monday-Friday from 9am-4pm, but there is someone available 24/7. If after hours or the weekend, call the main hospital number and ask for the Internal Medicine Resident On-Call. If you need medication refills, please notify your pharmacy one week in advance and they will send [place] a request.   Thank you for letting us take part in your care. Wishing you the best!  Wayland Denis, MD 08/07/2021, 3:49 PM IM Resident, PGY-1

## 2021-08-07 NOTE — Assessment & Plan Note (Addendum)
Patient presents for OV for wrist pain follow-up. Patient presented with wrist pain January of last year, with swelling and pain following strenuous activity using the wrist.  X-rays negative for fracture or soft tissue abnormality.  This was treated as tendon inflammation with scheduled ibuprofen and bracing.  Patient has been seen 3 more times since then for persistence of right wrist pain.  Low concern for pathology secondary to metastatic disease per oncology.  Patient's wrist pain intermittently improves with Voltaren gel.  Last seen 12/22 for wrist pain.  At that time, wrist was warm, swollen, erythematous.  Uric acid level was obtained and normal.  X-ray was obtained and showed multiple new and more conspicuous bony erosions involving the carpal bones and metacarpal bases with new mild midcarpal joint space narrowing.  Per radiology, hese findings are suspicious for inflammatory arthropathy such as RA.  Today, patient's wrist is mildly swollen, mildly erythematous, good ROM.  Patient reports pain comes and goes, is not more bothersome at 1 particular time like a.m. versus p.m.  Weak grip strength but able to grip objects as needed.  Does have pain when doing activities such as washing the dishes.  Pain worsens after strenuous activity involving the wrist.  Denies fevers, rashes.  Patient reports has previously followed with a rheumatologist, Dr. Charlestine Night, who patient reports is now retired.  Last saw him a few years ago for joint pain and gout.  Was previously on allopurinol though not currently taking this medication.  Reports has had previous work-up for rheumatologic process which was negative, however unable to obtain these results in epic.  Reports white fingertips in the cold, however when shown images of patients with classic Raynaud's, patient reports "that is not me".  Follows with ophthalmology. Previously followed with Dr. Coralyn Pear, though now follows with Dr. Herbert Deaner at [facility].   Ophthalmologic diagnoses include cystoid macular edema, retinal edema, anterior scleritis of the left eye, hypertensive retinopathy bilateral eyes, pseudophakia of bilateral eyes, ocular hypertension of left eye.  In patients with RA it is more common to see episcleritis or keratoconjunctivitis sicca.  On assessment, given duration of symptoms I favor diagnosis of osteoarthritis flare versus inflammatory arthritis.  Given recent x-ray findings, inflammatory arthritis is higher in the differential than previously thought.  Currently only 1 affected joint.  We will order RF and CCP today.  Gout was also on the differential, however given time course, this is less likely.  Septic arthritis also less likely given time course and mild symptoms.  Also considered hypertrophic osteoarthropathy in the setting of history of lung cancer.  Patient follows closely with his oncologist. Left second digit PIP joint with firm nodular swelling, wonder if this is evidence of tophaceous gout without acute gout flare in this joint.  Patient has been previously on allopurinol though is not currently on treatment at this time.  Uric acid level is normal, though this does not rule out acute gout flare in the wrist.  Given patient's mild symptoms and history of cancer, will hold off on prednisone treatment today.  His symptoms are improving with Voltaren gel and avoiding exercises that exacerbate pain.  Given good kidney function and no previous history of side effects from NSAIDs such as stomach ulcers, stomach bleeding, and improvement in pain with NSAIDs previously, patient will be instructed to take NSAIDs as needed for pain. Given persistence of swelling and erythema, I think it is reasonable to refer to orthopedics for right wrist aspiration and synovial fluid analysis.  Fluid analysis can further guide management.  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 Referral to orthopedics will be switched to referral to rheumatology.

## 2021-08-07 NOTE — Assessment & Plan Note (Addendum)
Patient was seen in the ED for pleuritic chest pain.  ACS work-up negative, work-up for PE also negative.  Was discharged home without any medications.  Was instructed to follow-up with PCP.  Patient reports persistence of chest pain with deep breaths and occasionally on exertion.  It occurs a couple times in the week, and has remained stable and has not worsened.  Pleuritic chest pain likely secondary to known history of lung cancer.  Patient will be instructed to start NSAIDs for joint pain, these may also help with patient's pleuritic pain.  Patient follows up regularly with his oncologist.

## 2021-08-07 NOTE — Telephone Encounter (Signed)
I spoke with the patient to let him know that Dr. Lisbeth Renshaw reviewed his PET scan and recommends repeat CT in 3 months to ensure there are no concerns for new or progressive disease. 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 Dr. Lisbeth Renshaw recommends evaluation with Dr. Brantley Stage as the PET seems most consistent with inflammatory/infectious symptoms. The patient admits to an orange sized lump in his axilla. He also continues to have trouble with his hands and wrists and will see his PCP today. 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 I suggested he discuss with his PCP. He may have raynaud's changes. With his O2 status, symptoms last month, and emphysematous changes, I recommended he see Pulmonary. He will discuss this with PCP today and we will follow this expectantly. I encouraged him to follow up with Dr. Brantley Stage as well versus PT evaluation for massage and compression at the [facility] location. He will consider this but is less interested in PT at this time. We will move his previously scheduled CT due in February 2023 out to April given these recent PET images. He is in agreement.

## 2021-08-07 NOTE — Assessment & Plan Note (Signed)
Patient recently seen in the ED where providers noted swelling in axilla. He had a biopsy done previously of the site.  Patient denied pain in the axilla.  ED provider contacted oncology to order PET scan.  PET scan showed low-grade activity at the left lung apex representing treated tumor, mild right axillary and subpectoral adenopathy, with recent core and excisional biopsies revealing reactive lymphoid hyperplasia with polytypic plasmacytosis.  Patient's next appointment with oncology is November 12, 2021.

## 2021-08-09 LAB — RHEUMATOID FACTOR: Rheumatoid fact SerPl-aCnc: 54.4 IU/mL — ABNORMAL HIGH (ref <14.0)

## 2021-08-09 LAB — CYCLIC CITRUL PEPTIDE ANTIBODY, IGG/IGA: Cyclic Citrullin Peptide Ab: 160 units — ABNORMAL HIGH (ref 0–19)

## 2021-08-09 NOTE — Addendum Note (Signed)
Addended by: Wayland Denis on: 08/09/2021 12:57 PM   Modules accepted: Orders

## 2021-08-09 NOTE — Progress Notes (Signed)
Internal Medicine Clinic Attending ° °I saw and evaluated the patient.  I personally confirmed the key portions of the history and exam documented by Dr. Zinoviev and I reviewed pertinent patient test results.  The assessment, diagnosis, and plan were formulated together and I agree with the documentation in the resident’s note.  °

## 2021-09-17 ENCOUNTER — Ambulatory Visit: Payer: Medicare Other | Admitting: Radiation Oncology

## 2021-09-17 DIAGNOSIS — H0288A Meibomian gland dysfunction right eye, upper and lower eyelids: Secondary | ICD-10-CM | POA: Diagnosis not present

## 2021-09-17 DIAGNOSIS — H00025 Hordeolum internum left lower eyelid: Secondary | ICD-10-CM | POA: Diagnosis not present

## 2021-09-17 DIAGNOSIS — H40013 Open angle with borderline findings, low risk, bilateral: Secondary | ICD-10-CM | POA: Diagnosis not present

## 2021-09-17 DIAGNOSIS — H15012 Anterior scleritis, left eye: Secondary | ICD-10-CM | POA: Diagnosis not present

## 2021-10-01 DIAGNOSIS — Z20822 Contact with and (suspected) exposure to covid-19: Secondary | ICD-10-CM | POA: Diagnosis not present

## 2021-11-05 ENCOUNTER — Ambulatory Visit (HOSPITAL_COMMUNITY)
Admission: RE | Admit: 2021-11-05 | Discharge: 2021-11-05 | Disposition: A | Discharge status: Home / Self Care | Payer: Medicare Other | Source: Ambulatory Visit | Attending: Radiation Oncology | Admitting: Radiation Oncology

## 2021-11-05 DIAGNOSIS — J439 Emphysema, unspecified: Secondary | ICD-10-CM | POA: Diagnosis not present

## 2021-11-05 DIAGNOSIS — C3412 Malignant neoplasm of upper lobe, left bronchus or lung: Secondary | ICD-10-CM | POA: Diagnosis not present

## 2021-11-05 DIAGNOSIS — C3490 Malignant neoplasm of unspecified part of unspecified bronchus or lung: Secondary | ICD-10-CM | POA: Diagnosis not present

## 2021-11-05 DIAGNOSIS — I700 Atherosclerosis of aorta: Secondary | ICD-10-CM | POA: Diagnosis not present

## 2021-11-05 LAB — POCT I-STAT CREATININE: Creatinine, Ser: 0.7 mg/dL (ref 0.61–1.24)

## 2021-11-05 MED ORDER — IOHEXOL 300 MG/ML  SOLN
75.0000 mL | Freq: Once | INTRAMUSCULAR | Status: AC | PRN
  Administered 2021-11-05: 75 mL via INTRAVENOUS

## 2021-11-12 ENCOUNTER — Encounter: Payer: Self-pay | Admitting: Radiation Oncology

## 2021-11-12 ENCOUNTER — Ambulatory Visit
Admission: RE | Admit: 2021-11-12 | Discharge: 2021-11-12 | Disposition: A | Discharge status: Home / Self Care | Payer: Medicare Other | Source: Ambulatory Visit | Attending: Radiation Oncology | Admitting: Radiation Oncology

## 2021-11-12 DIAGNOSIS — C3412 Malignant neoplasm of upper lobe, left bronchus or lung: Secondary | ICD-10-CM

## 2021-11-12 DIAGNOSIS — Z87891 Personal history of nicotine dependence: Secondary | ICD-10-CM | POA: Diagnosis not present
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
## 2021-11-12 NOTE — Progress Notes (Signed)
Telephone appointment. I verified patient identity and began nursing interview. Patient is doing well overall. No issues reported at this time. ? ?Meaningful use complete. ? ?Patient reminded of his 1:30pm-11/12/21 telephone appointment w/ Shona Simpson PA-C. I left my extension [phone] in case patient needs anything. Patient verbalized understanding of information. ? ?Patient contact [phone] ?

## 2021-11-22 DIAGNOSIS — Z20822 Contact with and (suspected) exposure to covid-19: Secondary | ICD-10-CM | POA: Diagnosis not present

## 2022-03-09 ENCOUNTER — Other Ambulatory Visit: Payer: Self-pay | Admitting: Internal Medicine

## 2022-03-09 DIAGNOSIS — I483 Typical atrial flutter: Secondary | ICD-10-CM

## 2022-03-14 DIAGNOSIS — Z23 Encounter for immunization: Secondary | ICD-10-CM | POA: Diagnosis not present

## 2022-03-22 ENCOUNTER — Ambulatory Visit (INDEPENDENT_AMBULATORY_CARE_PROVIDER_SITE_OTHER): Payer: Medicare Other | Admitting: *Deleted

## 2022-03-22 ENCOUNTER — Encounter: Payer: Self-pay | Admitting: *Deleted

## 2022-03-22 DIAGNOSIS — Z0000 Encounter for general adult medical examination without abnormal findings: Secondary | ICD-10-CM | POA: Diagnosis not present
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
## 2022-03-22 NOTE — Patient Instructions (Signed)
Health Maintenance, Male Adopting a healthy lifestyle and getting preventive care are important in promoting health and wellness. Ask your health care provider about: The right schedule for you to have regular tests and exams. Things you can do on your own to prevent diseases and keep yourself healthy. What should I know about diet, weight, and exercise? Eat a healthy diet  Eat a diet that includes plenty of vegetables, fruits, low-fat dairy products, and lean protein. Do not eat a lot of foods that are high in solid fats, added sugars, or sodium. Maintain a healthy weight Body mass index (BMI) is a measurement that can be used to identify possible weight problems. It estimates body fat based on height and weight. Your health care provider can help determine your BMI and help you achieve or maintain a healthy weight. Get regular exercise Get regular exercise. This is one of the most important things you can do for your health. Most adults should: Exercise for at least 150 minutes each week. The exercise should increase your heart rate and make you sweat (moderate-intensity exercise). Do strengthening exercises at least twice a week. This is in addition to the moderate-intensity exercise. Spend less time sitting. Even light physical activity can be beneficial. Watch cholesterol and blood lipids Have your blood tested for lipids and cholesterol at 76 years of age, then have this test every 5 years. You may need to have your cholesterol levels checked more often if: Your lipid or cholesterol levels are high. You are older than 76 years of age. You are at high risk for heart disease. What should I know about cancer screening? Many types of cancers can be detected early and may often be prevented. Depending on your health history and family history, you may need to have cancer screening at various ages. This may include screening for: Colorectal cancer. Prostate cancer. Skin cancer. Lung  cancer. What should I know about heart disease, diabetes, and high blood pressure? Blood pressure and heart disease High blood pressure causes heart disease and increases the risk of stroke. This is more likely to develop in people who have high blood pressure readings or are overweight. Talk with your health care provider about your target blood pressure readings. Have your blood pressure checked: Every 3-5 years if you are 18-39 years of age. Every year if you are 40 years old or older. If you are between the ages of 65 and 75 and are a current or former smoker, ask your health care provider if you should have a one-time screening for abdominal aortic aneurysm (AAA). Diabetes Have regular diabetes screenings. This checks your fasting blood sugar level. Have the screening done: Once every three years after age 45 if you are at a normal weight and have a low risk for diabetes. More often and at a younger age if you are overweight or have a high risk for diabetes. What should I know about preventing infection? Hepatitis B If you have a higher risk for hepatitis B, you should be screened for this virus. Talk with your health care provider to find out if you are at risk for hepatitis B infection. Hepatitis C Blood testing is recommended for: Everyone born from 1945 through 1965. Anyone with known risk factors for hepatitis C. Sexually transmitted infections (STIs) You should be screened each year for STIs, including gonorrhea and chlamydia, if: You are sexually active and are younger than 76 years of age. You are older than 76 years of age and your   health care provider tells you that you are at risk for this type of infection. Your sexual activity has changed since you were last screened, and you are at increased risk for chlamydia or gonorrhea. Ask your health care provider if you are at risk. Ask your health care provider about whether you are at high risk for HIV. Your health care provider  may recommend a prescription medicine to help prevent HIV infection. If you choose to take medicine to prevent HIV, you should first get tested for HIV. You should then be tested every 3 months for as long as you are taking the medicine. Follow these instructions at home: Alcohol use Do not drink alcohol if your health care provider tells you not to drink. If you drink alcohol: Limit how much you have to 0-2 drinks a day. Know how much alcohol is in your drink. In the [place] one drink equals one 12 oz bottle of beer (355 mL), one 5 oz glass of wine (148 mL), or one 1 oz glass of hard liquor (44 mL). Lifestyle Do not use any products that contain nicotine or tobacco. These products include cigarettes, chewing tobacco, and vaping devices, such as e-cigarettes. If you need help quitting, ask your health care provider. Do not use street drugs. Do not share needles. Ask your health care provider for help if you need support or information about quitting drugs. General instructions Schedule regular health, dental, and eye exams. Stay current with your vaccines. Tell your health care provider if: You often feel depressed. You have ever been abused or do not feel safe at home. Summary Adopting a healthy lifestyle and getting preventive care are important in promoting health and wellness. Follow your health care provider's instructions about healthy diet, exercising, and getting tested or screened for diseases. Follow your health care provider's instructions on monitoring your cholesterol and blood pressure. This information is not intended to replace advice given to you by your health care provider. Make sure you discuss any questions you have with your health care provider. Document Revised: 11/27/2020 Document Reviewed: 11/27/2020 Elsevier Patient Education  2023 Elsevier Inc.  

## 2022-04-09 ENCOUNTER — Ambulatory Visit (INDEPENDENT_AMBULATORY_CARE_PROVIDER_SITE_OTHER): Payer: Medicare Other | Admitting: Internal Medicine

## 2022-04-09 VITALS — BP 106/57 | HR 65 | Temp 97.6°F | Ht 67.0 in | Wt 131.6 lb

## 2022-04-09 DIAGNOSIS — J9611 Chronic respiratory failure with hypoxia: Secondary | ICD-10-CM

## 2022-04-09 DIAGNOSIS — M1990 Unspecified osteoarthritis, unspecified site: Secondary | ICD-10-CM | POA: Diagnosis not present

## 2022-04-09 DIAGNOSIS — Z13820 Encounter for screening for osteoporosis: Secondary | ICD-10-CM | POA: Diagnosis not present

## 2022-04-09 DIAGNOSIS — M810 Age-related osteoporosis without current pathological fracture: Secondary | ICD-10-CM | POA: Diagnosis not present

## 2022-04-09 DIAGNOSIS — J449 Chronic obstructive pulmonary disease, unspecified: Secondary | ICD-10-CM | POA: Diagnosis not present

## 2022-04-09 DIAGNOSIS — M05731 Rheumatoid arthritis with rheumatoid factor of right wrist without organ or systems involvement: Secondary | ICD-10-CM | POA: Diagnosis not present

## 2022-04-09 DIAGNOSIS — M1380 Other specified arthritis, unspecified site: Secondary | ICD-10-CM

## 2022-04-09 DIAGNOSIS — Z87891 Personal history of nicotine dependence: Secondary | ICD-10-CM | POA: Diagnosis not present

## 2022-04-09 DIAGNOSIS — M4005 Postural kyphosis, thoracolumbar region: Secondary | ICD-10-CM | POA: Diagnosis not present

## 2022-04-09 MED ORDER — FOLIC ACID 1 MG PO TABS: 1.0000 mg | ORAL_TABLET | Freq: Every day | ORAL | 2 refills | Status: DC

## 2022-04-09 MED ORDER — ALBUTEROL SULFATE HFA 108 (90 BASE) MCG/ACT IN AERS: 1.0000 | INHALATION_SPRAY | Freq: Four times a day (QID) | RESPIRATORY_TRACT | 0 refills | Status: DC | PRN

## 2022-04-09 MED ORDER — METHOTREXATE SODIUM 7.5 MG PO TABS: 7.5000 mg | ORAL_TABLET | ORAL | 0 refills | Status: DC

## 2022-04-09 NOTE — Assessment & Plan Note (Signed)
Follow-up for wrist pain.Xray from 12/22 showed multiple new bony erosions involving carpal bones and metacarpal bases. CCP and RF elevated 1/23. He was referred to rheumatology due to high concern for rheumatoid arthritis. When rheum office called him he declined appointment because it was several months out. Since then he has continued to have pain in right wrist and is using Voltaren gel with some relief. He is also having symptoms consistent with raynauds phenomenon that is getting worse with colder mornings.  Plan: Start methotrexate 7.5 mg weekly Start Folic acid 1 mg daily -CBC, CMP ordered -follow-up in 4 weeks He will need monthly monitoring for first 3 months with initiation of methotrexate. I have messaged Dr. Estanislado Pandy with [facility] to see if patient needs another referral.
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
## 2022-04-09 NOTE — Assessment & Plan Note (Addendum)
Patient presents with several year history of difficulty standing up straight. On exam he does have kyphosis. Risk factors include low body weight, cigarette smoking, and Plan: Dexa scan. If Tscore < -2.5, then he will need vitamin D level.

## 2022-04-09 NOTE — Patient Instructions (Addendum)
Thank you, [name] for allowing [place] to provide your care today.   Rheumatoid arthritis I am starting a medication called methotrexate to treat rheumatoid arthritis. You will take this medication 1 time a week. You need to also take Folic acid every day. I am getting labs today and you will need to follow-up in 4 weeks to repeat those. I will see if we can get you in to the rheumatologist. Please also give them a call:  [facility] in [place], [place] in: [place] office suites Address: [address], [place], [place] [address]   Phone: [phone]  Back bending I would like to get an imaging study done to look at the thickness of your bones. You will be called to schedule that.  I have ordered the following labs for you:  Lab Orders         CBC no Diff         CMP14 + Anion Gap       Referrals ordered today:   Referral Orders  No referral(s) requested today     I have ordered the following medication/changed the following medications:   Stop the following medications: There are no discontinued medications.   Start the following medications: Meds ordered this encounter  Medications   methotrexate (RHEUMATREX) 7.5 MG tablet    Sig: Take 1 tablet (7.5 mg total) by mouth once a week. Caution" Chemotherapy. Protect from light.    Dispense:  4 tablet    Refill:  0   folic acid (FOLVITE) 1 MG tablet    Sig: Take 1 tablet (1 mg total) by mouth daily.    Dispense:  90 tablet    Refill:  2     Follow up:  4 weeks    We look forward to seeing you next time. Please call our clinic at [phone] if you have any questions or concerns. The best time to call is Monday-Friday from 9am-4pm, but there is someone available 24/7. If after hours or the weekend, call the main hospital number and ask for the Internal Medicine Resident On-Call. If you need medication refills, please notify your pharmacy one week in advance and they  will send [place] a request.   Thank you for trusting me with your care. Wishing you the best!   Christiana Fuchs, [place]

## 2022-04-09 NOTE — Assessment & Plan Note (Signed)
Patient requests refill on albuterol inhaler. He has history of smoking which he quit several years ago and has Stage 1a upper lobe lung cancer followed by oncology. He uses supplemental oxygen and maintains functional. He states that he carries albuterol inhaler with him when he goes out the the grocery store or doctor. He uses 1-2 times monthly when he feel short of breath walking into building. Symptoms are relieved by albuterol. A: No prior PFTs done. Likely patient has COPD with smoking history. Symptoms relieved by inhaler and he is not frequently symptomatic. P: -refill of albuterol sent

## 2022-04-10 LAB — CBC
Hematocrit: 34.4 % — ABNORMAL LOW (ref 37.5–51.0)
Hemoglobin: 10.6 g/dL — ABNORMAL LOW (ref 13.0–17.7)
MCH: 25.8 pg — ABNORMAL LOW (ref 26.6–33.0)
MCHC: 30.8 g/dL — ABNORMAL LOW (ref 31.5–35.7)
MCV: 84 fL (ref 79–97)
Platelets: 233 10*3/uL (ref 150–450)
RBC: 4.11 x10E6/uL — ABNORMAL LOW (ref 4.14–5.80)
RDW: 14.4 % (ref 11.6–15.4)
WBC: 8.5 10*3/uL (ref 3.4–10.8)

## 2022-04-10 LAB — CMP14 + ANION GAP
ALT: 14 IU/L (ref 0–44)
AST: 19 IU/L (ref 0–40)
Albumin/Globulin Ratio: 1.2 (ref 1.2–2.2)
Albumin: 4.1 g/dL (ref 3.8–4.8)
Alkaline Phosphatase: 74 IU/L (ref 44–121)
Anion Gap: 18 mmol/L (ref 10.0–18.0)
BUN/Creatinine Ratio: 27 — ABNORMAL HIGH (ref 10–24)
BUN: 18 mg/dL (ref 8–27)
Bilirubin Total: 0.4 mg/dL (ref 0.0–1.2)
CO2: 27 mmol/L (ref 20–29)
Calcium: 9.4 mg/dL (ref 8.6–10.2)
Chloride: 98 mmol/L (ref 96–106)
Creatinine, Ser: 0.67 mg/dL — ABNORMAL LOW (ref 0.76–1.27)
Globulin, Total: 3.5 g/dL (ref 1.5–4.5)
Glucose: 88 mg/dL (ref 70–99)
Potassium: 5.4 mmol/L — ABNORMAL HIGH (ref 3.5–5.2)
Sodium: 143 mmol/L (ref 134–144)
Total Protein: 7.6 g/dL (ref 6.0–8.5)
eGFR: 97 mL/min/{1.73_m2} (ref >59)

## 2022-04-10 NOTE — Progress Notes (Addendum)
Internal Medicine Clinic Attending  Case discussed with Dr. Howie Ill  At the time of the visit.  We reviewed the resident's history and exam and pertinent patient test results.  I agree with the assessment, diagnosis, and plan of care documented in the resident's note.   Recommend further work up for his anemia at follow up with iron studies, vitamin B12, consider GI referral. ALso recommend CBC with differential next time for monitoring of methotrexate toxicity.

## 2022-04-15 ENCOUNTER — Ambulatory Visit: Payer: Self-pay

## 2022-04-15 NOTE — Patient Outreach (Signed)
  Care Coordination   Initial Visit Note   04/15/2022 Name: Ansley Stanwood MRN: [id number] DOB: 04/28/1946  Jett Fukuda is a 76 y.o. year old male who sees Masters, Joellen Jersey, DO for primary care. I  discussed case with Dr. Howie Ill as she is concerned patient cannot afford dietary supplements.  What matters to the patients health and wellness today?  Patient provided, via mail coupons to assist him with purchasing dietary supplements.    Goals Addressed   None     SDOH assessments and interventions completed:  Yes  SDOH Interventions Today    Flowsheet Row Most Recent Value  SDOH Interventions   Transportation Interventions Intervention Not Indicated  Financial Strain Interventions Other (Comment)  [Provided coupons for dietary supplements]        Care Coordination Interventions Activated:  Yes  Care Coordination Interventions:  Yes, provided   Follow up plan: No further intervention required.   Encounter Outcome:  Pt. Visit Completed

## 2022-05-01 ENCOUNTER — Other Ambulatory Visit: Payer: Self-pay | Admitting: Internal Medicine

## 2022-05-01 DIAGNOSIS — J9611 Chronic respiratory failure with hypoxia: Secondary | ICD-10-CM

## 2022-05-02 DIAGNOSIS — H04123 Dry eye syndrome of bilateral lacrimal glands: Secondary | ICD-10-CM | POA: Diagnosis not present

## 2022-05-02 DIAGNOSIS — H16223 Keratoconjunctivitis sicca, not specified as Sjogren's, bilateral: Secondary | ICD-10-CM | POA: Diagnosis not present

## 2022-05-02 DIAGNOSIS — H15012 Anterior scleritis, left eye: Secondary | ICD-10-CM | POA: Diagnosis not present

## 2022-05-02 DIAGNOSIS — H40013 Open angle with borderline findings, low risk, bilateral: Secondary | ICD-10-CM | POA: Diagnosis not present

## 2022-05-06 DIAGNOSIS — Z23 Encounter for immunization: Secondary | ICD-10-CM | POA: Diagnosis not present

## 2022-05-13 ENCOUNTER — Ambulatory Visit (INDEPENDENT_AMBULATORY_CARE_PROVIDER_SITE_OTHER): Payer: Medicare Other | Admitting: Internal Medicine

## 2022-05-13 ENCOUNTER — Other Ambulatory Visit: Payer: Self-pay

## 2022-05-13 ENCOUNTER — Encounter: Payer: Self-pay | Admitting: Internal Medicine

## 2022-05-13 VITALS — BP 113/60 | HR 63 | Temp 97.7°F | Ht 67.0 in | Wt 133.2 lb

## 2022-05-13 DIAGNOSIS — D509 Iron deficiency anemia, unspecified: Secondary | ICD-10-CM

## 2022-05-13 DIAGNOSIS — M0579 Rheumatoid arthritis with rheumatoid factor of multiple sites without organ or systems involvement: Secondary | ICD-10-CM

## 2022-05-13 DIAGNOSIS — Z0000 Encounter for general adult medical examination without abnormal findings: Secondary | ICD-10-CM

## 2022-05-13 DIAGNOSIS — M1990 Unspecified osteoarthritis, unspecified site: Secondary | ICD-10-CM | POA: Diagnosis not present

## 2022-05-13 DIAGNOSIS — D649 Anemia, unspecified: Secondary | ICD-10-CM | POA: Diagnosis not present

## 2022-05-13 DIAGNOSIS — R7303 Prediabetes: Secondary | ICD-10-CM | POA: Diagnosis not present

## 2022-05-13 LAB — POCT GLYCOSYLATED HEMOGLOBIN (HGB A1C): Hemoglobin A1C: 5.7 % — AB (ref 4.0–5.6)

## 2022-05-13 LAB — GLUCOSE, CAPILLARY: Glucose-Capillary: 87 mg/dL (ref 70–99)

## 2022-05-13 NOTE — Assessment & Plan Note (Addendum)
Patient with a history of rheumatoid arthritis and raynauds phenomenon that is worse with cold weather. He was started on methotrexate 7.5 mg weekly therapy on 04/09/22.   He reports he completed the 4-week methotrexate course with no issues. He states that his back stiffness and joint stiffness in his wrists and hands are gradually improving.  Plan to follow-up CBC, CMP, and vitamin B12 levels from today. Pending lab results, plan to increase his methotrexate to 10 mg weekly, 4-week course. Continue with folic acid 1 mg daily. Follow-up appointment in 4-weeks to continue methotrexate therapy monitoring. He has an appointment with Rheumatology in 08/14/22.

## 2022-05-13 NOTE — Patient Instructions (Addendum)
Jason Carter,  It was great to see you in clinic today and we are glad to hear that your joints are feeling less stiff than the last time we saw you in clinic. Below is our plan for you by each medical issue:  For your rheumatoid arthritis - continue with methotrexate therapy and folic acid supplementation; appointment with rheumatologist 08/14/21. Based off of lab work we are getting today, we may increase your methotrexate dose. We will call with results and inform you if we plan to increase your methotrexate dose.  For your anemia - We will order a colonoscopy for you to get in the future and also continue working up other potential causes of your anemia with some blood work.   For your tailbone wound - The wound appears to be closed and healed right now. Sometimes these wounds can result from pressure after laying down for a prolonged time. We encourage changing positions often to prevent these pressure wounds.  We will plan to have a follow-up appointment with you in 4-weeks how to see your rheumatoid arthritis therapy is going and discuss further any other significant lab results.  Thank you for allowing [place] to be a part of your care team,  Student-doctor Max and Dr. Howie Ill   Please call our clinic if you have any questions or concerns, we may be able to help and keep you from a long and expensive emergency room wait. Our clinic and after hours phone number is [phone], the best time to call is Monday through Friday 9 am to 4 pm but there is always someone available 24/7 if you have an emergency. If you need medication refills please notify your pharmacy one week in advance and they will send [place] a request.
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
## 2022-05-13 NOTE — Assessment & Plan Note (Signed)
Patient's Hgb A1c at 5.7 today. He is right at the lower limits of prediabetes. Will continue to encourage healthy diet and follow-up on Hgb A1C in 3 months.

## 2022-05-13 NOTE — Assessment & Plan Note (Addendum)
Patient has a history of normocytic anemia - 10.6 Hgb and 84 MCV most recent CBC in 09/23. Patient denies dark or bloody stools. He does endorse ocassional hemorrhoids.   Plan to follow-up CBC w/diff and Iron, TIBC, Ferritin studies. GI referral for colonoscopy initiated. His main barrier to getting colonoscopy is having someone that will be able to drive him to and from the appointment. Will continue to monitor for symptoms of worsening fatigue and pallor at his f/u appointment in 4 weeks.

## 2022-05-14 LAB — IRON,TIBC AND FERRITIN PANEL
Ferritin: 188 ng/mL (ref 30–400)
Iron Saturation: 12 % — ABNORMAL LOW (ref 15–55)
Iron: 34 ug/dL — ABNORMAL LOW (ref 38–169)
Total Iron Binding Capacity: 290 ug/dL (ref 250–450)
UIBC: 256 ug/dL (ref 111–343)

## 2022-05-14 LAB — CMP14 + ANION GAP
ALT: 19 IU/L (ref 0–44)
AST: 20 IU/L (ref 0–40)
Albumin/Globulin Ratio: 1.3 (ref 1.2–2.2)
Albumin: 4.2 g/dL (ref 3.8–4.8)
Alkaline Phosphatase: 79 IU/L (ref 44–121)
Anion Gap: 13 mmol/L (ref 10.0–18.0)
BUN/Creatinine Ratio: 23 (ref 10–24)
BUN: 13 mg/dL (ref 8–27)
Bilirubin Total: 0.3 mg/dL (ref 0.0–1.2)
CO2: 28 mmol/L (ref 20–29)
Calcium: 9.6 mg/dL (ref 8.6–10.2)
Chloride: 95 mmol/L — ABNORMAL LOW (ref 96–106)
Creatinine, Ser: 0.57 mg/dL — ABNORMAL LOW (ref 0.76–1.27)
Globulin, Total: 3.3 g/dL (ref 1.5–4.5)
Glucose: 81 mg/dL (ref 70–99)
Potassium: 4.7 mmol/L (ref 3.5–5.2)
Sodium: 136 mmol/L (ref 134–144)
Total Protein: 7.5 g/dL (ref 6.0–8.5)
eGFR: 102 mL/min/{1.73_m2} (ref >59)

## 2022-05-14 LAB — CBC WITH DIFFERENTIAL/PLATELET
Basophils Absolute: 0 10*3/uL (ref 0.0–0.2)
Basos: 0 %
EOS (ABSOLUTE): 0.5 10*3/uL — ABNORMAL HIGH (ref 0.0–0.4)
Eos: 8 %
Hematocrit: 34.7 % — ABNORMAL LOW (ref 37.5–51.0)
Hemoglobin: 11.2 g/dL — ABNORMAL LOW (ref 13.0–17.7)
Immature Grans (Abs): 0 10*3/uL (ref 0.0–0.1)
Immature Granulocytes: 0 %
Lymphocytes Absolute: 1 10*3/uL (ref 0.7–3.1)
Lymphs: 14 %
MCH: 26.7 pg (ref 26.6–33.0)
MCHC: 32.3 g/dL (ref 31.5–35.7)
MCV: 83 fL (ref 79–97)
Monocytes Absolute: 0.6 10*3/uL (ref 0.1–0.9)
Monocytes: 9 %
Neutrophils Absolute: 4.6 10*3/uL (ref 1.4–7.0)
Neutrophils: 69 %
Platelets: 260 10*3/uL (ref 150–450)
RBC: 4.2 x10E6/uL (ref 4.14–5.80)
RDW: 14.4 % (ref 11.6–15.4)
WBC: 6.7 10*3/uL (ref 3.4–10.8)

## 2022-05-14 LAB — VITAMIN B12: Vitamin B-12: 837 pg/mL (ref 232–1245)

## 2022-05-14 MED ORDER — FERROUS SULFATE 325 (65 FE) MG PO TABS: ORAL_TABLET | ORAL | 3 refills | Status: DC

## 2022-05-14 MED ORDER — METHOTREXATE SODIUM 10 MG PO TABS: 10.0000 mg | ORAL_TABLET | ORAL | 3 refills | Status: DC

## 2022-05-14 NOTE — Addendum Note (Signed)
Addended by: Edwyna Perfect on: 05/14/2022 11:40 AM   Modules accepted: Orders

## 2022-05-14 NOTE — Addendum Note (Signed)
Addended by: Edwyna Perfect on: 05/14/2022 09:06 AM   Modules accepted: Orders

## 2022-05-17 ENCOUNTER — Ambulatory Visit (HOSPITAL_COMMUNITY)
Admission: RE | Admit: 2022-05-17 | Discharge: 2022-05-17 | Disposition: A | Discharge status: Home / Self Care | Payer: Medicare Other | Source: Ambulatory Visit | Attending: Radiation Oncology | Admitting: Radiation Oncology

## 2022-05-17 DIAGNOSIS — C3490 Malignant neoplasm of unspecified part of unspecified bronchus or lung: Secondary | ICD-10-CM | POA: Diagnosis not present

## 2022-05-17 DIAGNOSIS — J439 Emphysema, unspecified: Secondary | ICD-10-CM | POA: Diagnosis not present

## 2022-05-17 DIAGNOSIS — C3412 Malignant neoplasm of upper lobe, left bronchus or lung: Secondary | ICD-10-CM | POA: Diagnosis not present

## 2022-05-17 DIAGNOSIS — R918 Other nonspecific abnormal finding of lung field: Secondary | ICD-10-CM | POA: Diagnosis not present

## 2022-05-17 MED ORDER — IOHEXOL 300 MG/ML  SOLN
100.0000 mL | Freq: Once | INTRAMUSCULAR | Status: AC | PRN
  Administered 2022-05-17: 75 mL via INTRAVENOUS

## 2022-05-17 MED ORDER — SODIUM CHLORIDE (PF) 0.9 % IJ SOLN
INTRAMUSCULAR | Status: AC
  Filled 2022-05-17: qty 50

## 2022-05-20 ENCOUNTER — Encounter: Payer: Self-pay | Admitting: Radiation Oncology

## 2022-05-20 ENCOUNTER — Ambulatory Visit
Admission: RE | Admit: 2022-05-20 | Discharge: 2022-05-20 | Disposition: A | Discharge status: Home / Self Care | Payer: Medicare Other | Source: Ambulatory Visit | Attending: Radiation Oncology | Admitting: Radiation Oncology

## 2022-05-20 DIAGNOSIS — C3412 Malignant neoplasm of upper lobe, left bronchus or lung: Secondary | ICD-10-CM

## 2022-05-20 DIAGNOSIS — Z87891 Personal history of nicotine dependence: Secondary | ICD-10-CM | POA: Diagnosis not present

## 2022-05-20 NOTE — Progress Notes (Signed)
Internal Medicine Clinic Attending  Case discussed with Dr. Howie Ill  at the time of the visit.  We reviewed the resident's history and exam and pertinent patient test results.  I agree with the assessment, diagnosis, and plan of care documented in the resident's note.
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
## 2022-05-20 NOTE — Progress Notes (Addendum)
Telephone nursing appointment for Jason neoplasm of upper lobe of left lung ([place]). I verified patient's identity and began nursing interview. Patient reports mild, dull LT sided chest/upper back pain 3/10, and occasional dizziness. Patient also reports that his SOB has improved overall and he has been using his oxygen less daily.  I advised patient of chest pain signs and symptoms that indicate the need to go to the nearest ER.   Meaningful use complete.   Patient aware of their 1:00pm-05/20/22 telephone appointment w/ Shona Simpson PA-C. I left my extension [phone] in case patient needs anything. Patient verbalized understanding. This concludes the nursing interview.   Patient contact [phone]     Leandra Kern, LPN

## 2022-05-21 ENCOUNTER — Telehealth: Payer: Self-pay | Admitting: Radiation Oncology

## 2022-05-21 DIAGNOSIS — C3412 Malignant neoplasm of upper lobe, left bronchus or lung: Secondary | ICD-10-CM

## 2022-05-21 NOTE — Telephone Encounter (Signed)
I called the patient to let him know that Dr. Lisbeth Renshaw reviewed his imaging and would recommend a repeat CT chest in 2 months. He is in agreement with this. He also is aware that Dr. Lisbeth Renshaw recommends that Dr. Brantley Stage also be involved in any further follow up of his axillary adenopathy that remains as it's called stable in the body of the report, but progressed in the impression as well as larger by reviewing the images.

## 2022-07-18 ENCOUNTER — Ambulatory Visit (HOSPITAL_COMMUNITY)
Admission: RE | Admit: 2022-07-18 | Discharge: 2022-07-18 | Disposition: A | Discharge status: Home / Self Care | Payer: Medicare Other | Source: Ambulatory Visit | Attending: Radiation Oncology | Admitting: Radiation Oncology

## 2022-07-18 DIAGNOSIS — J439 Emphysema, unspecified: Secondary | ICD-10-CM | POA: Diagnosis not present

## 2022-07-18 DIAGNOSIS — R911 Solitary pulmonary nodule: Secondary | ICD-10-CM | POA: Diagnosis not present

## 2022-07-18 DIAGNOSIS — C3412 Malignant neoplasm of upper lobe, left bronchus or lung: Secondary | ICD-10-CM | POA: Diagnosis not present

## 2022-07-18 DIAGNOSIS — C3490 Malignant neoplasm of unspecified part of unspecified bronchus or lung: Secondary | ICD-10-CM | POA: Diagnosis not present

## 2022-07-18 LAB — POCT I-STAT CREATININE: Creatinine, Ser: 0.7 mg/dL (ref 0.61–1.24)

## 2022-07-18 IMAGING — CR DG CHEST 2V
2 series · 2 of 2 positions shown · non-contrast
Comparison: 03/28/2020, 06/30/2020

CLINICAL DATA: Hypermetabolic left apical lung nodule, preprocedure
assessment for biopsy

EXAM:
CHEST - 2 VIEW

[w chest pa]
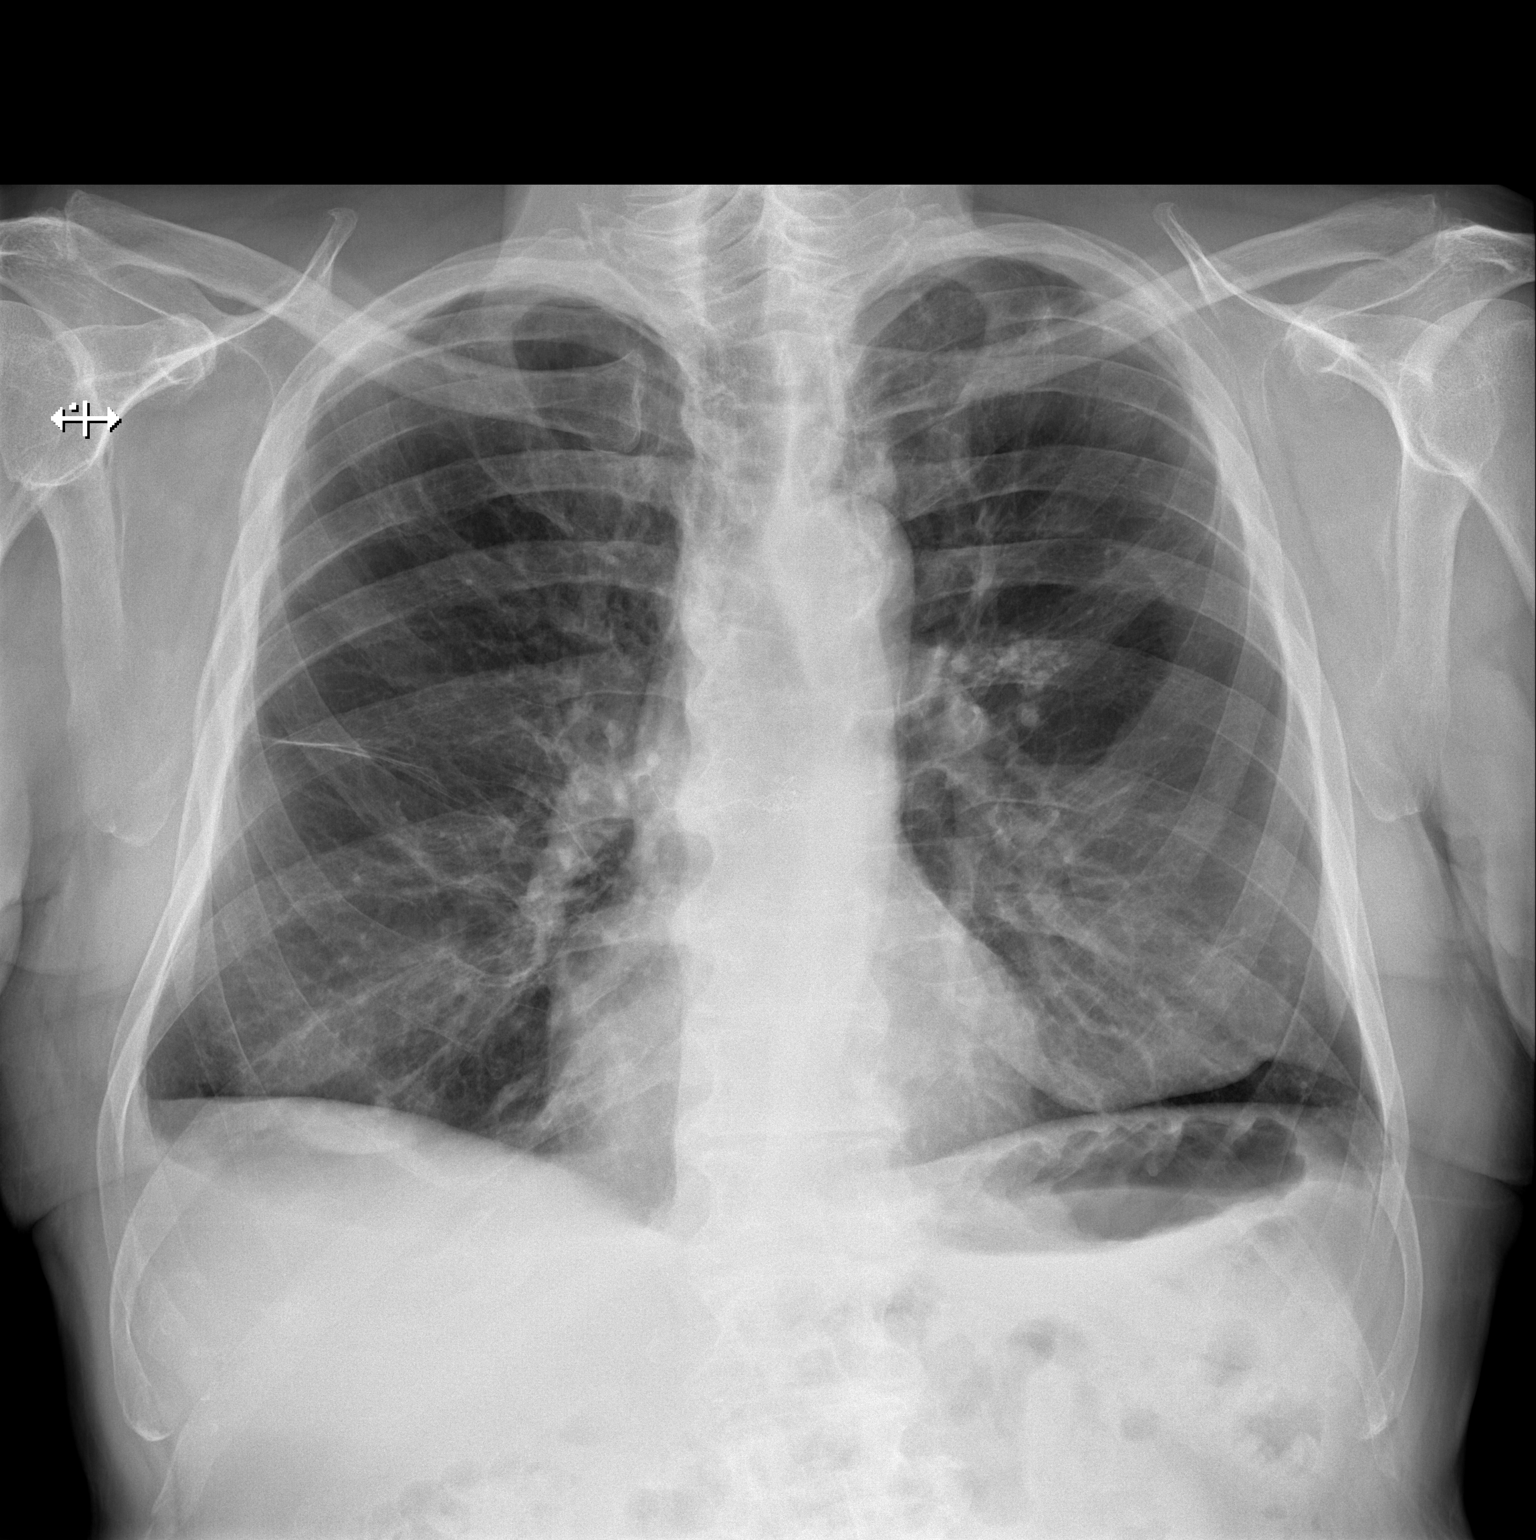

[w chest lat]
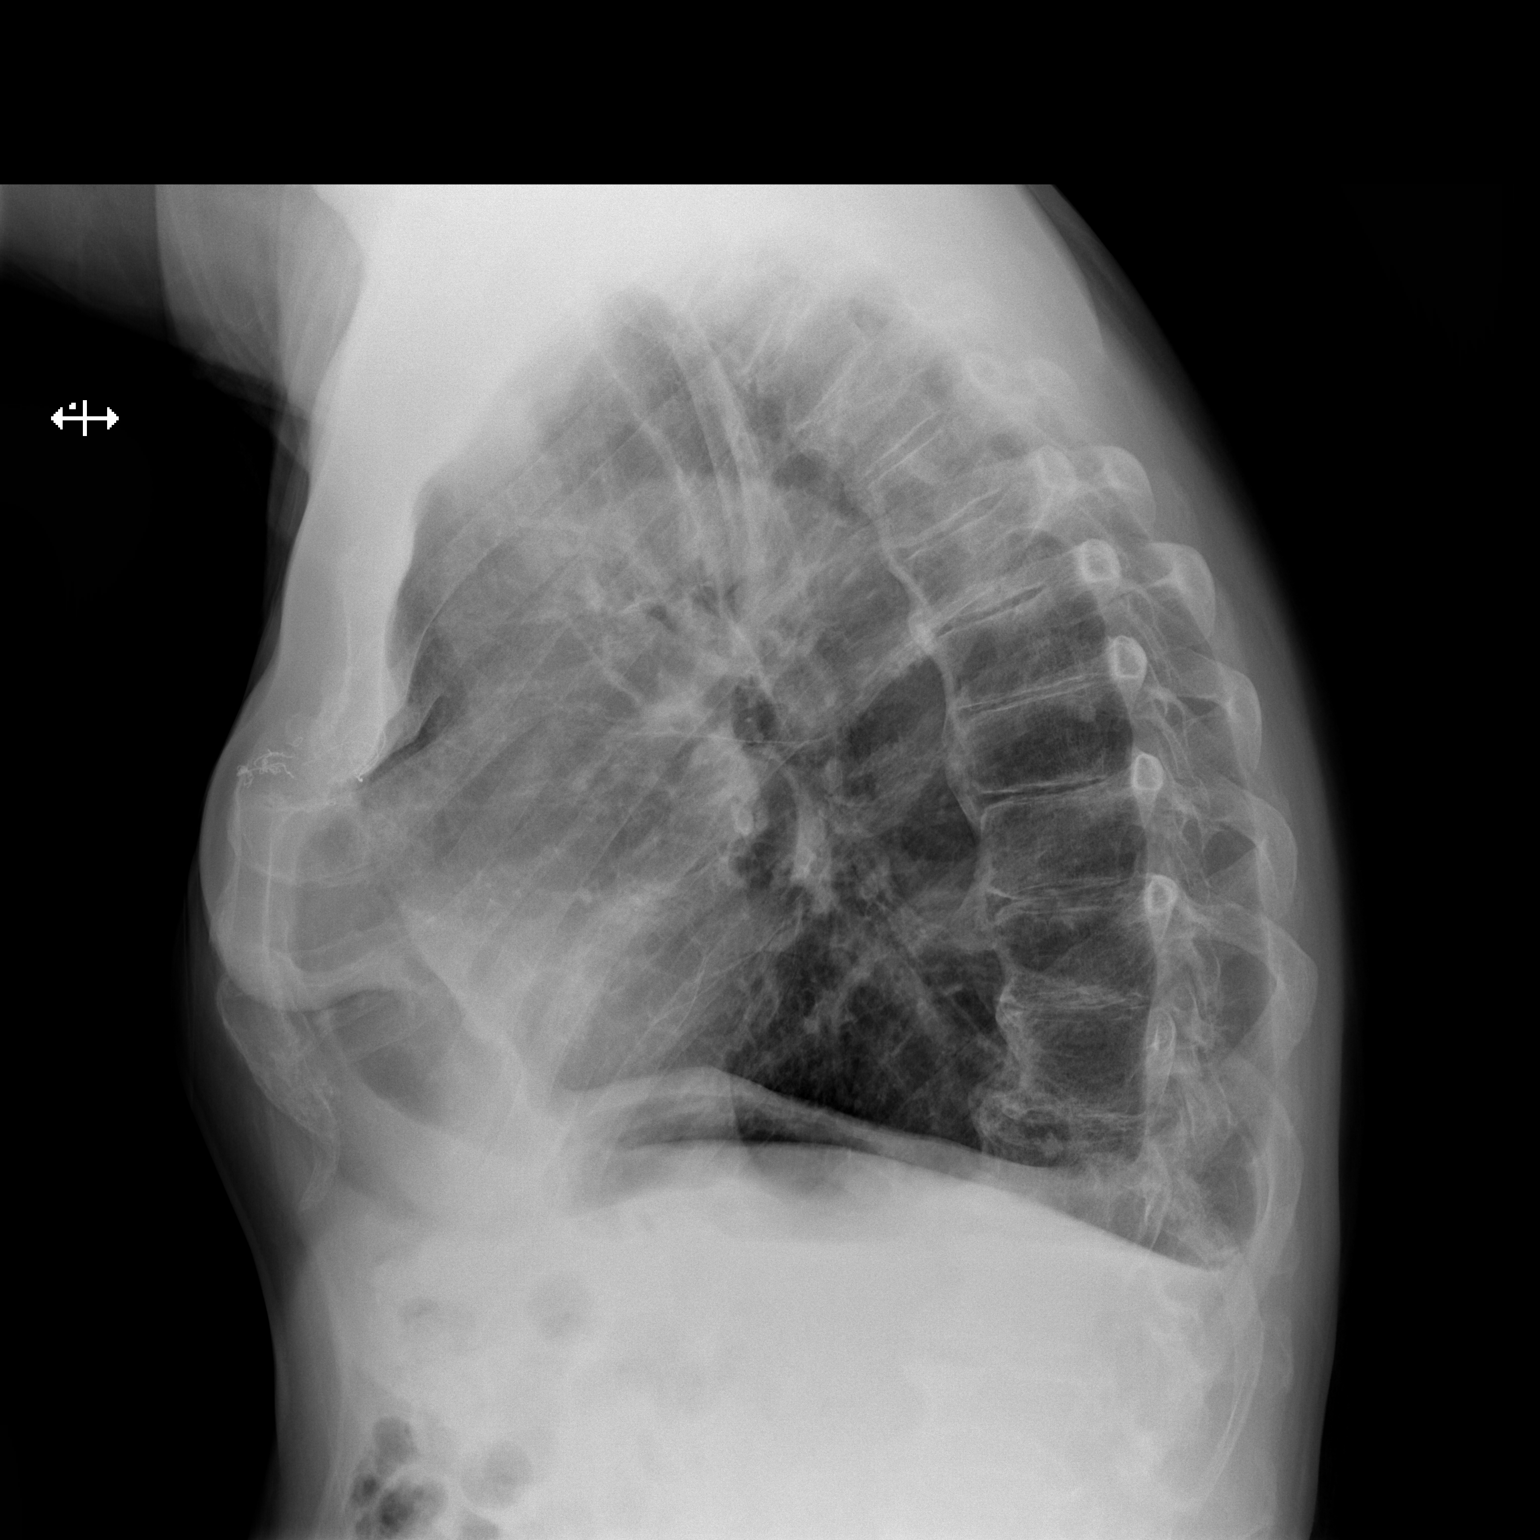

[2 of 2 positions shown; findings below may reference images not displayed]

FINDINGS: Frontal and lateral views of the chest demonstrate a stable cardiac
silhouette. 12 mm spiculated nodule overlies the left anterior first
rib, unchanged since prior exams. No acute airspace disease,
effusion, or pneumothorax. Stable calcifications along the left
major fissure. Stable upper lobe predominant emphysema. Stable
postsurgical changes of the sternum.
IMPRESSION: 1. Stable spiculated 12 mm left apical pulmonary nodule, concerning
for malignancy given PET scan.
2. Stable emphysema.

## 2022-07-18 MED ORDER — IOHEXOL 300 MG/ML  SOLN
75.0000 mL | Freq: Once | INTRAMUSCULAR | Status: AC | PRN
  Administered 2022-07-18: 75 mL via INTRAVENOUS

## 2022-07-18 MED ORDER — SODIUM CHLORIDE (PF) 0.9 % IJ SOLN
INTRAMUSCULAR | Status: AC
  Filled 2022-07-18: qty 50

## 2022-07-23 ENCOUNTER — Telehealth: Payer: Self-pay | Admitting: *Deleted

## 2022-07-23 ENCOUNTER — Ambulatory Visit
Admission: RE | Admit: 2022-07-23 | Discharge: 2022-07-23 | Disposition: A | Discharge status: Home / Self Care | Payer: Medicare Other | Source: Ambulatory Visit | Attending: Radiation Oncology | Admitting: Radiation Oncology

## 2022-07-23 ENCOUNTER — Encounter: Payer: Self-pay | Admitting: Radiation Oncology

## 2022-07-23 DIAGNOSIS — C3412 Malignant neoplasm of upper lobe, left bronchus or lung: Secondary | ICD-10-CM | POA: Diagnosis not present

## 2022-07-23 DIAGNOSIS — Z87891 Personal history of nicotine dependence: Secondary | ICD-10-CM | POA: Diagnosis not present

## 2022-07-23 NOTE — Telephone Encounter (Signed)
CALLED PATIENT TO INFORM OF CT FOR 01-21-23 - ARRIVAL TIME- 2:30 PM @ [facility], PATIENT TO HAVE WATER ONLY- 4 HRS. PRIOR TO TEST, PATIENT TO RECEIVE RESULTS FROM ALISON PERKINS ON 01-27-23 @ 1 PM VIA TELEPHONE, LVM FOR A RETURN CALL

## 2022-07-23 NOTE — Progress Notes (Signed)
Telephone nursing appointment for Malignant neoplasm of upper lobe of left lung ([place]). Patient is to receive most recent CT results from 07/20/22. I verified patient's identity and began nursing interview.  Patient reports doing well. No issues relayed at this time.  Meaningful use complete.  This concludes the interview.   Leandra Kern, LPN
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
## 2022-08-13 ENCOUNTER — Ambulatory Visit: Payer: Medicare Other

## 2022-08-13 ENCOUNTER — Ambulatory Visit (INDEPENDENT_AMBULATORY_CARE_PROVIDER_SITE_OTHER): Payer: Medicare Other

## 2022-08-13 ENCOUNTER — Encounter: Payer: Self-pay | Admitting: Rheumatology

## 2022-08-13 ENCOUNTER — Ambulatory Visit: Payer: Medicare Other | Attending: Rheumatology | Admitting: Rheumatology

## 2022-08-13 VITALS — BP 115/73 | HR 59 | Resp 17 | Ht 65.0 in | Wt 140.4 lb

## 2022-08-13 DIAGNOSIS — M79641 Pain in right hand: Secondary | ICD-10-CM | POA: Diagnosis not present

## 2022-08-13 DIAGNOSIS — M79671 Pain in right foot: Secondary | ICD-10-CM

## 2022-08-13 DIAGNOSIS — M1990 Unspecified osteoarthritis, unspecified site: Secondary | ICD-10-CM

## 2022-08-13 DIAGNOSIS — I10 Essential (primary) hypertension: Secondary | ICD-10-CM | POA: Diagnosis not present

## 2022-08-13 DIAGNOSIS — R768 Other specified abnormal immunological findings in serum: Secondary | ICD-10-CM

## 2022-08-13 DIAGNOSIS — Z8719 Personal history of other diseases of the digestive system: Secondary | ICD-10-CM

## 2022-08-13 DIAGNOSIS — C3412 Malignant neoplasm of upper lobe, left bronchus or lung: Secondary | ICD-10-CM | POA: Diagnosis not present

## 2022-08-13 DIAGNOSIS — Z8739 Personal history of other diseases of the musculoskeletal system and connective tissue: Secondary | ICD-10-CM | POA: Diagnosis not present

## 2022-08-13 DIAGNOSIS — Z79899 Other long term (current) drug therapy: Secondary | ICD-10-CM

## 2022-08-13 DIAGNOSIS — M79642 Pain in left hand: Secondary | ICD-10-CM

## 2022-08-13 DIAGNOSIS — M25462 Effusion, left knee: Secondary | ICD-10-CM

## 2022-08-13 DIAGNOSIS — Z862 Personal history of diseases of the blood and blood-forming organs and certain disorders involving the immune mechanism: Secondary | ICD-10-CM | POA: Diagnosis not present

## 2022-08-13 DIAGNOSIS — M25531 Pain in right wrist: Secondary | ICD-10-CM

## 2022-08-13 DIAGNOSIS — I700 Atherosclerosis of aorta: Secondary | ICD-10-CM

## 2022-08-13 DIAGNOSIS — Z8709 Personal history of other diseases of the respiratory system: Secondary | ICD-10-CM | POA: Diagnosis not present

## 2022-08-13 DIAGNOSIS — Z87891 Personal history of nicotine dependence: Secondary | ICD-10-CM | POA: Diagnosis not present

## 2022-08-13 DIAGNOSIS — M79672 Pain in left foot: Secondary | ICD-10-CM | POA: Diagnosis not present

## 2022-08-13 DIAGNOSIS — M25532 Pain in left wrist: Secondary | ICD-10-CM | POA: Diagnosis not present

## 2022-08-13 DIAGNOSIS — G5612 Other lesions of median nerve, left upper limb: Secondary | ICD-10-CM

## 2022-08-13 DIAGNOSIS — Z8679 Personal history of other diseases of the circulatory system: Secondary | ICD-10-CM | POA: Diagnosis not present

## 2022-08-13 NOTE — Patient Instructions (Addendum)
Once labs result, we will call you and send hte prescription for methotrexate. Take with folic acid 2mg  (2 tablets) daily. Minimize alcohol use to one drink a week  Methotrexate Tablets What is this medication? METHOTREXATE (METH oh TREX ate) treats inflammatory conditions such as arthritis and psoriasis. It works by decreasing inflammation, which can reduce pain and prevent long-term injury to the joints and skin. It may also be used to treat some types of cancer. It works by slowing down the growth of cancer cells. This medicine may be used for other purposes; ask your health care provider or pharmacist if you have questions. COMMON BRAND NAME(S): Rheumatrex, Trexall What should I tell my care team before I take this medication? They need to know if you have any of these conditions: Fluid in the stomach area or lungs If you often drink alcohol Infection or immune system problems Kidney disease or on hemodialysis Liver disease Low blood counts, like low white cell, platelet, or red cell counts Lung disease Radiation therapy Stomach ulcers Ulcerative colitis An unusual or allergic reaction to methotrexate, other medications, foods, dyes, or preservatives Pregnant or trying to get pregnant Breast-feeding How should I use this medication? Take this medication by mouth with a glass of water. Follow the directions on the prescription label. Take your medication at regular intervals. Do not take it more often than directed. Do not stop taking except on your care team's advice. Make sure you know why you are taking this medication and how often you should take it. If this medication is used for a condition that is not cancer, like arthritis or psoriasis, it should be taken weekly, NOT daily. Taking this medication more often than directed can cause serious side effects, even death. Talk to your care team about safe handling and disposal of this medication. You may need to take special  precautions. Talk to your care team about the use of this medication in children. While this medication may be prescribed for selected conditions, precautions do apply. Overdosage: If you think you have taken too much of this medicine contact a poison control center or emergency room at once. NOTE: This medicine is only for you. Do not share this medicine with others. What if I miss a dose? If you miss a dose, talk with your care team. Do not take double or extra doses. What may interact with this medication? Do not take this medication with any of the following: Acitretin This medication may also interact with the following: Aspirin and aspirin-like medications including salicylates Azathioprine Certain antibiotics like penicillins, tetracycline, and chloramphenicol Certain medications that treat or prevent blood clots like warfarin, apixaban, dabigatran, and rivaroxaban Certain medications for stomach problems like esomeprazole, omeprazole, pantoprazole Cyclosporine Dapsone Diuretics Gold Hydroxychloroquine Live virus vaccines Medications for infection like acyclovir, adefovir, amphotericin B, bacitracin, cidofovir, foscarnet, ganciclovir, gentamicin, pentamidine, vancomycin Mercaptopurine NSAIDs, medications for pain and inflammation, like ibuprofen or naproxen Other cytotoxic agents Pamidronate Pemetrexed Penicillamine Phenylbutazone Phenytoin Probenecid Pyrimethamine Retinoids such as isotretinoin and tretinoin Steroid medications like prednisone or cortisone Sulfonamides like sulfasalazine and trimethoprim/sulfamethoxazole Theophylline Zoledronic acid This list may not describe all possible interactions. Give your health care provider a list of all the medicines, herbs, non-prescription drugs, or dietary supplements you use. Also tell them if you smoke, drink alcohol, or use illegal drugs. Some items may interact with your medicine. What should I watch for while using this  medication? Avoid alcoholic drinks. This medication can make you more sensitive to the sun.  Keep out of the sun. If you cannot avoid being in the sun, wear protective clothing and use sunscreen. Do not use sun lamps or tanning beds/booths. You may need blood work done while you are taking this medication. Call your care team for advice if you get a fever, chills or sore throat, or other symptoms of a cold or flu. Do not treat yourself. This medication decreases your body's ability to fight infections. Try to avoid being around people who are sick. This medication may increase your risk to bruise or bleed. Call your care team if you notice any unusual bleeding. Be careful brushing or flossing your teeth or using a toothpick because you may get an infection or bleed more easily. If you have any dental work done, tell your dentist you are receiving this medication. Check with your care team if you get an attack of severe diarrhea, nausea and vomiting, or if you sweat a lot. The loss of too much body fluid can make it dangerous for you to take this medication. Talk to your care team about your risk of cancer. You may be more at risk for certain types of cancers if you take this medication. Do not become pregnant while taking this medication or for 6 months after stopping it. Women should inform their care team if they wish to become pregnant or think they might be pregnant. Men should not father a child while taking this medication and for 3 months after stopping it. There is potential for serious harm to an unborn child. Talk to your care team for more information. Do not breast-feed an infant while taking this medication or for 1 week after stopping it. This medication may make it more difficult to get pregnant or father a child. Talk to your care team if you are concerned about your fertility. What side effects may I notice from receiving this medication? Side effects that you should report to your care  team as soon as possible: Allergic reactions--skin rash, itching, hives, swelling of the face, lips, tongue, or throat Blood clot--pain, swelling, or warmth in the leg, shortness of breath, chest pain Dry cough, shortness of breath or trouble breathing Infection--fever, chills, cough, sore throat, wounds that don't heal, pain or trouble when passing urine, general feeling of discomfort or being unwell Kidney injury--decrease in the amount of urine, swelling of the ankles, hands, or feet Liver injury--right upper belly pain, loss of appetite, nausea, light-colored stool, dark yellow or brown urine, yellowing of the skin or eyes, unusual weakness or fatigue Low red blood cell count--unusual weakness or fatigue, dizziness, headache, trouble breathing Redness, blistering, peeling, or loosening of the skin, including inside the mouth Seizures Unusual bruising or bleeding Side effects that usually do not require medical attention (report to your care team if they continue or are bothersome): Diarrhea Dizziness Hair loss Nausea Pain, redness, or swelling with sores inside the mouth or throat Vomiting This list may not describe all possible side effects. Call your doctor for medical advice about side effects. You may report side effects to FDA at 1-800-FDA-1088. Where should I keep my medication? Keep out of the reach of children and pets. Store at room temperature between 20 and 25 degrees C (68 and 77 degrees F). Protect from light. Get rid of any unused medication after the expiration date. Talk to your care team about how to dispose of unused medication. Special directions may apply. NOTE: This sheet is a summary. It may not cover all possible  information. If you have questions about this medicine, talk to your doctor, pharmacist, or health care provider.  2023 [employer]/Gold Standard (2020-09-11 00:00:00)

## 2022-08-13 NOTE — Progress Notes (Signed)
Pharmacy Note  Subjective: Patient presents today to [facility] for follow up office visit. 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  Has been on MTX 10mg  in past (took for total two month, 7.5mg  weekly x 1 month then 10mg  weekly x 1 month). Last dose in Nov 2023 (?) based on dispense history  Appears he has completed radiotherapy for lung cancer in Jan 2022 per review of most recent oncology note  Objective: CBC    Component Value Date/Time   WBC 6.7 05/13/2022 1603   WBC 7.5 07/12/2021 1524   RBC 4.20 05/13/2022 1603   RBC 4.33 07/12/2021 1524   HGB 11.2 (L) 05/13/2022 1603   HCT 34.7 (L) 05/13/2022 1603   PLT 260 05/13/2022 1603   MCV 83 05/13/2022 1603   MCH 26.7 05/13/2022 1603   MCH 27.7 07/12/2021 1524   MCHC 32.3 05/13/2022 1603   MCHC 30.9 07/12/2021 1524   RDW 14.4 05/13/2022 1603   LYMPHSABS 1.0 05/13/2022 1603   MONOABS 0.6 07/12/2021 1524   EOSABS 0.5 (H) 05/13/2022 1603   BASOSABS 0.0 05/13/2022 1603    CMP     Component Value Date/Time   NA 136 05/13/2022 1603   K 4.7 05/13/2022 1603   CL 95 (L) 05/13/2022 1603   CO2 28 05/13/2022 1603   GLUCOSE 81 05/13/2022 1603   GLUCOSE 113 (H) 07/12/2021 1524   BUN 13 05/13/2022 1603   CREATININE 0.70 07/18/2022 1628   CREATININE 0.72 03/12/2021 1134   CALCIUM 9.6 05/13/2022 1603   PROT 7.5 05/13/2022 1603   ALBUMIN 4.2 05/13/2022 1603   AST 20 05/13/2022 1603   ALT 19 05/13/2022 1603   ALKPHOS 79 05/13/2022 1603   BILITOT 0.3 05/13/2022 1603   GFRNONAA >60 07/12/2021 1524   GFRNONAA >60 03/12/2021 1134   GFRAA >60 03/28/2020 1324    Baseline Immunosuppressant Therapy Labs TB GOLD   Hepatitis Panel   HIV No results found for: "HIV" Immunoglobulins   SPEP    Latest Ref Rng & Units 05/13/2022    4:03 PM  Serum Protein Electrophoresis  Total Protein 6.0 - 8.5 g/dL 7.5    G6PD No results found for:  "G6PDH" TPMT No results found for: "TPMT"   Chest-xray:  07/18/2022 - 1. Stable cavitary lesion at the left lung apex with extensive soft tissue nodularity surrounding the fiducials. Post treatment radiation changes versus recurrent tumor. Repeat PET-CT may be helpful for further evaluation. 2. Stable 8 mm subpleural pulmonary nodule in the left upper lobe, possible metastatic focus or metachronous neoplasm. 3. No mediastinal or hilar mass or adenopathy. 4. Stable emphysematous changes and pulmonary scarring. Stable granulomatous changes. 5. Stable three-vessel coronary artery calcifications.  Contraception: N/A  Alcohol use: he drinks wine around lunch and two beers at nighttime; he willing to cut back on alcohol use  Assessment/Plan:   Patient was counseled on the purpose, proper use, and adverse effects of methotrexate including nausea, infection, and signs and symptoms of pneumonitis. Discussed that there is the possibility of an increased risk of malignancy, specifically lymphomas, but it is not well understood if this increased risk is due to the medication or the disease state.  Instructed patient that medication should be held for infection and prior to surgery.  Advised patient to avoid live vaccines. Recommend annual influenza, Pneumovax 23, Prevnar 13, and Shingrix as indicated.   Reviewed instructions with patient to take methotrexate weekly  along with folic acid daily.  Discussed the importance of frequent monitoring of kidney and liver function and blood counts, and provided patient with standing lab instructions.  Counseled patient to avoid NSAIDs and alcohol while on methotrexate.  He is iwlling to minimize alcohol use to one drink once weekly. Provided patient with educational materials on methotrexate and answered all questions.   Patient voiced understanding.  Patient consented to methotrexate use.  Will upload into chart.    Dose of methotrexate will be 15mg  once weekly x [redacted]  weeks along with folic acid 2mg  daily. Repeat labs. If stable, will increase to MTX 20mg  nce weekly with folic acid 2mg  daily. Prescription pending baseline labs.  Knox Saliva, PharmD, MPH, BCPS, CPP Clinical Pharmacist (Rheumatology and Pulmonology)

## 2022-08-16 ENCOUNTER — Other Ambulatory Visit: Payer: Self-pay

## 2022-08-16 DIAGNOSIS — Z79899 Other long term (current) drug therapy: Secondary | ICD-10-CM

## 2022-08-16 MED ORDER — FOLIC ACID 1 MG PO TABS: 2.0000 mg | ORAL_TABLET | Freq: Every day | ORAL | 3 refills | Status: DC

## 2022-08-16 MED ORDER — METHOTREXATE SODIUM 2.5 MG PO TABS: ORAL_TABLET | ORAL | 0 refills | Status: DC

## 2022-08-16 NOTE — Progress Notes (Signed)
Most of the results are back.  Rheumatoid factor and anti-CCP are positive consistent with rheumatoid arthritis.  It is okay to send methotrexate 6 tablets p.o. weekly along with folic acid 2 mg p.o. daily.  Patient should have repeat labs in 2 weeks and if labs are normal we will increase methotrexate to 8 tablets p.o. weekly.  Patient should have labs 2 weeks x 2 and then every 3 months.

## 2022-08-16 NOTE — Progress Notes (Signed)
Per lab note on 08/13/2022: Most of the results are back.  Rheumatoid factor and anti-CCP are positive consistent with rheumatoid arthritis.  It is okay to send methotrexate 6 tablets p.o. weekly along with folic acid 2 mg p.o. daily.  Patient should have repeat labs in 2 weeks and if labs are normal we will increase methotrexate to 8 tablets p.o. weekly.  Patient should have labs 2 weeks x 2 and then every 3 months.   Please review and sign pended prescriptions and lab orders. Thanks!

## 2022-08-26 LAB — HEPATITIS B SURFACE ANTIGEN: Hepatitis B Surface Ag: NONREACTIVE

## 2022-08-26 LAB — CBC WITH DIFFERENTIAL/PLATELET
Absolute Monocytes: 768 cells/uL (ref 200–950)
Basophils Absolute: 38 cells/uL (ref 0–200)
Basophils Relative: 0.5 %
Eosinophils Absolute: 494 cells/uL (ref 15–500)
Eosinophils Relative: 6.5 %
HCT: 35.4 % — ABNORMAL LOW (ref 38.5–50.0)
Hemoglobin: 11.4 g/dL — ABNORMAL LOW (ref 13.2–17.1)
Lymphs Abs: 1269 cells/uL (ref 850–3900)
MCH: 27.1 pg (ref 27.0–33.0)
MCHC: 32.2 g/dL (ref 32.0–36.0)
MCV: 84.3 fL (ref 80.0–100.0)
MPV: 11.6 fL (ref 7.5–12.5)
Monocytes Relative: 10.1 %
Neutro Abs: 5031 cells/uL (ref 1500–7800)
Neutrophils Relative %: 66.2 %
Platelets: 207 10*3/uL (ref 140–400)
RBC: 4.2 10*6/uL (ref 4.20–5.80)
RDW: 13.5 % (ref 11.0–15.0)
Total Lymphocyte: 16.7 %
WBC: 7.6 10*3/uL (ref 3.8–10.8)

## 2022-08-26 LAB — QUANTIFERON-TB GOLD PLUS
Mitogen-NIL: >10 IU/mL
NIL: 0.03 IU/mL
QuantiFERON-TB Gold Plus: NEGATIVE
TB1-NIL: 0.01 IU/mL
TB2-NIL: 0.01 IU/mL

## 2022-08-26 LAB — COMPLETE METABOLIC PANEL WITH GFR
AG Ratio: 1.1 (calc) (ref 1.0–2.5)
ALT: 15 U/L (ref 9–46)
AST: 17 U/L (ref 10–35)
Albumin: 4.1 g/dL (ref 3.6–5.1)
Alkaline phosphatase (APISO): 70 U/L (ref 35–144)
BUN/Creatinine Ratio: 34 (calc) — ABNORMAL HIGH (ref 6–22)
BUN: 21 mg/dL (ref 7–25)
CO2: 28 mmol/L (ref 20–32)
Calcium: 9.5 mg/dL (ref 8.6–10.3)
Chloride: 100 mmol/L (ref 98–110)
Creat: 0.61 mg/dL — ABNORMAL LOW (ref 0.70–1.28)
Globulin: 3.7 g/dL (calc) (ref 1.9–3.7)
Glucose, Bld: 79 mg/dL (ref 65–99)
Potassium: 4.7 mmol/L (ref 3.5–5.3)
Sodium: 140 mmol/L (ref 135–146)
Total Bilirubin: 0.5 mg/dL (ref 0.2–1.2)
Total Protein: 7.8 g/dL (ref 6.1–8.1)
eGFR: 100 mL/min/{1.73_m2} (ref >=60)

## 2022-08-26 LAB — PROTEIN ELECTROPHORESIS, SERUM, WITH REFLEX
Albumin ELP: 4 g/dL (ref 3.8–4.8)
Alpha 1: 0.3 g/dL (ref 0.2–0.3)
Alpha 2: 0.9 g/dL (ref 0.5–0.9)
Beta 2: 0.6 g/dL — ABNORMAL HIGH (ref 0.2–0.5)
Beta Globulin: 0.5 g/dL (ref 0.4–0.6)
Gamma Globulin: 1.4 g/dL (ref 0.8–1.7)
Total Protein: 7.7 g/dL (ref 6.1–8.1)

## 2022-08-26 LAB — IGG, IGA, IGM
IgG (Immunoglobin G), Serum: 1539 mg/dL (ref 600–1540)
IgM, Serum: 60 mg/dL (ref 50–300)
Immunoglobulin A: 571 mg/dL — ABNORMAL HIGH (ref 70–320)

## 2022-08-26 LAB — CYCLIC CITRUL PEPTIDE ANTIBODY, IGG: Cyclic Citrullin Peptide Ab: 180 UNITS — ABNORMAL HIGH

## 2022-08-26 LAB — HEPATITIS B CORE ANTIBODY, IGM: Hep B C IgM: NONREACTIVE

## 2022-08-26 LAB — URIC ACID: Uric Acid, Serum: 5.7 mg/dL (ref 4.0–8.0)

## 2022-08-26 LAB — IFE INTERPRETATION

## 2022-08-26 LAB — HEPATITIS C ANTIBODY: Hepatitis C Ab: NONREACTIVE

## 2022-08-26 LAB — SEDIMENTATION RATE: Sed Rate: 45 mm/h — ABNORMAL HIGH (ref 0–20)

## 2022-08-26 LAB — RHEUMATOID FACTOR: Rheumatoid fact SerPl-aCnc: 52 IU/mL — ABNORMAL HIGH (ref <14)

## 2022-08-26 IMAGING — DX DG WRIST COMPLETE 3+V*R*
4 series · 4 of 4 positions shown · non-contrast
Comparison: None.

CLINICAL DATA: Right-sided wrist pain and swelling for 2 weeks, no
known injury, initial encounter

EXAM:
RIGHT WRIST - COMPLETE 3+ VIEW

[wrist pa]
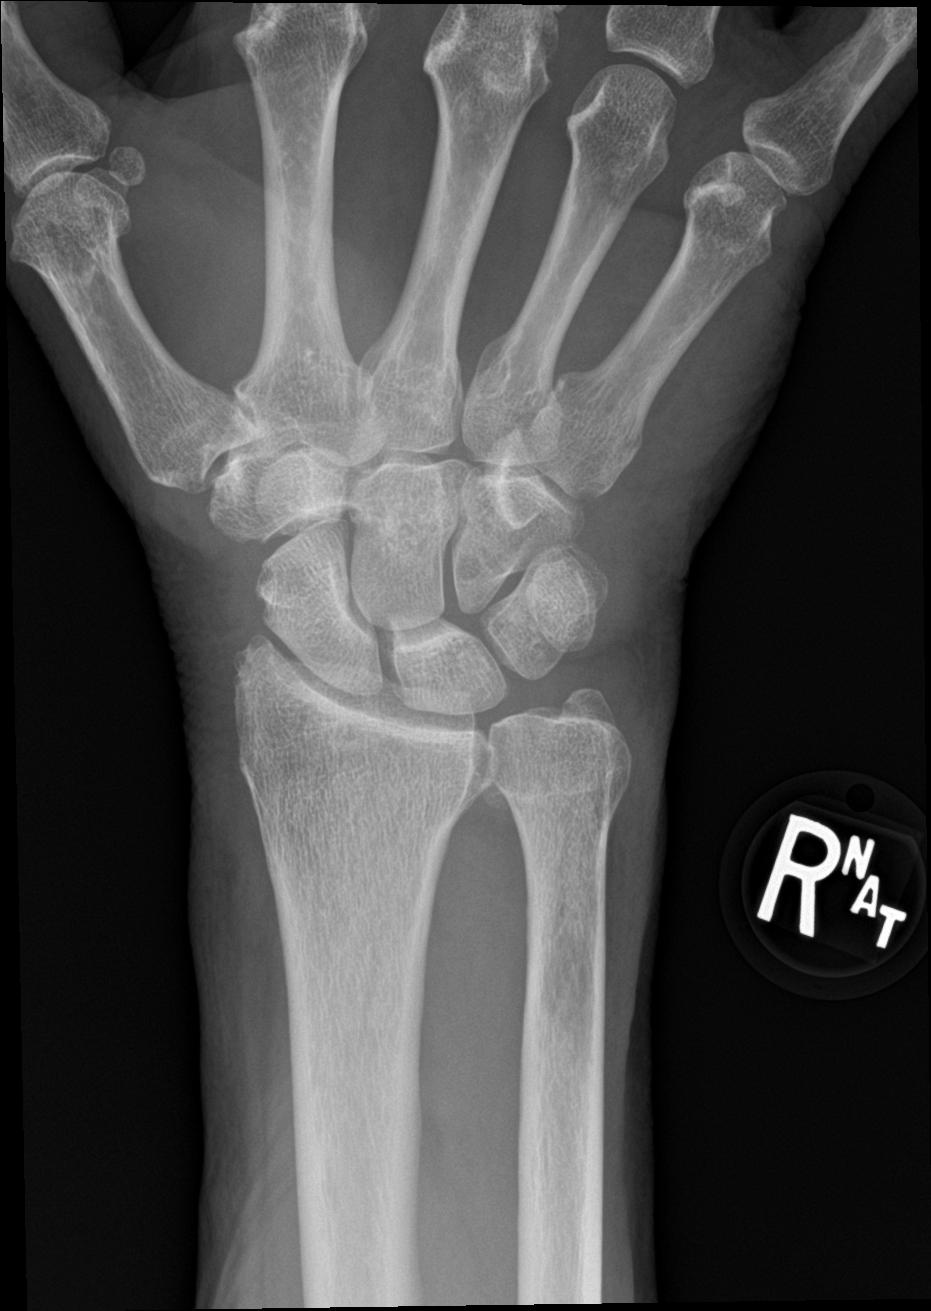

[wrist navicular]
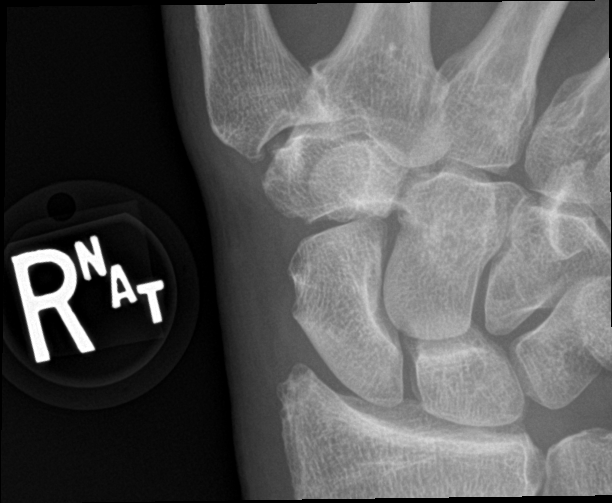

[wrist obl]
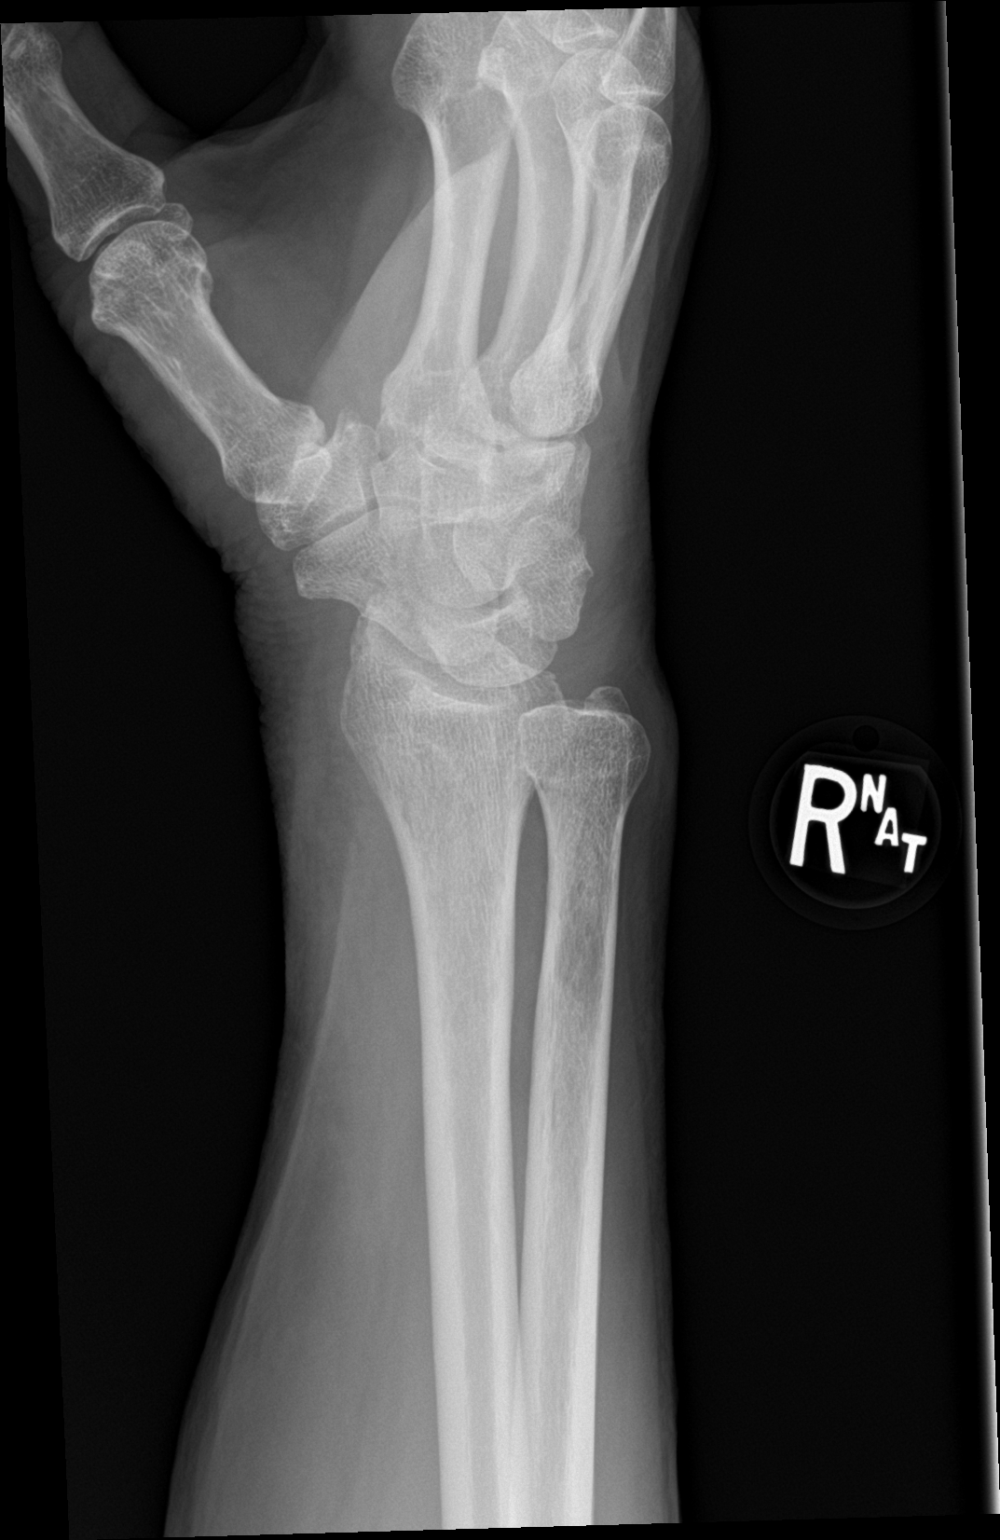

[wrist lat]
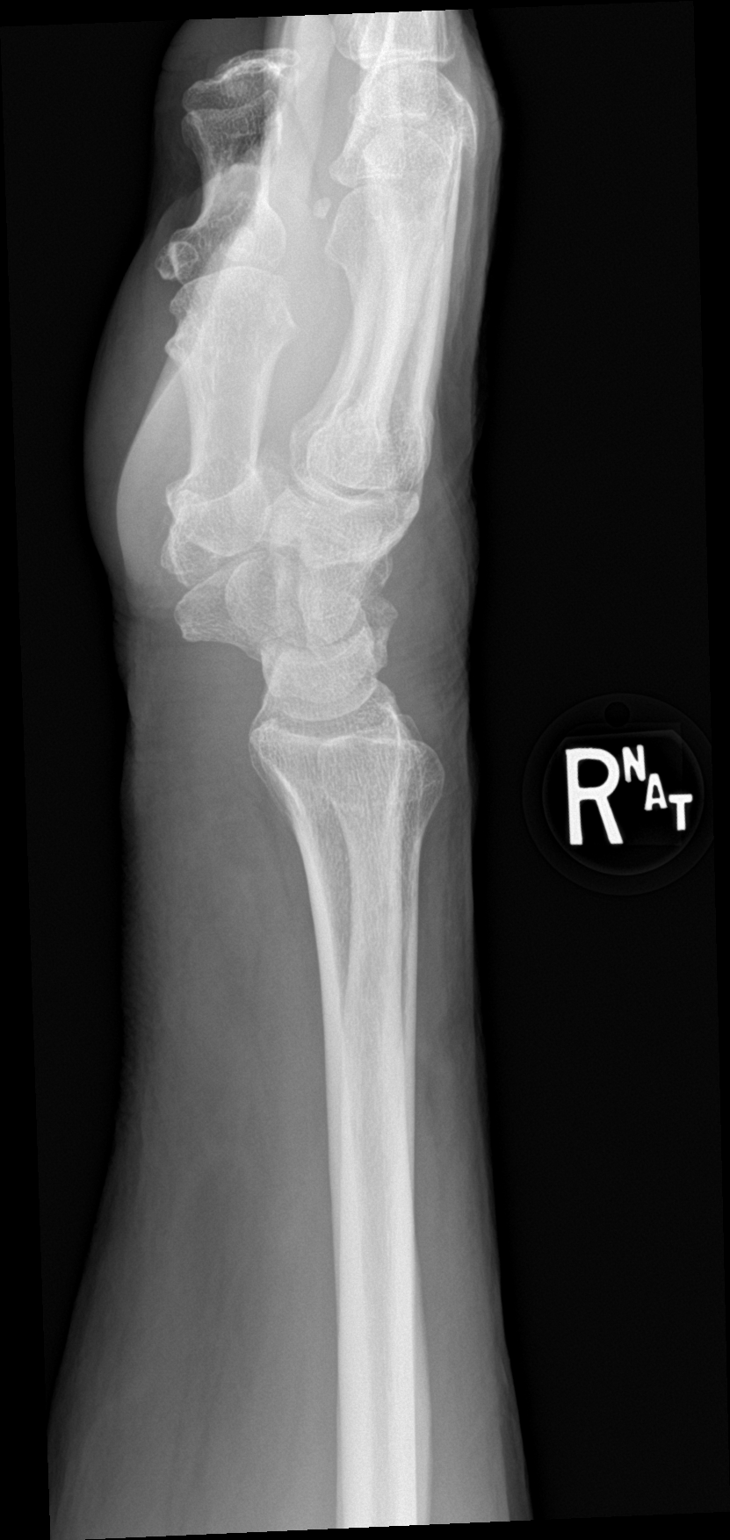

[4 of 4 positions shown; findings below may reference images not displayed]

FINDINGS: No acute fracture or dislocation is noted. No gross soft tissue
abnormality is seen. Some vague lucency is noted in the distal
diaphysis of ulna.
IMPRESSION: No acute fracture noted.

Lucency in the distal ulna. Possibility metastatic disease deserves
consideration. Bone scan may be helpful for further evaluation.

## 2022-08-28 DIAGNOSIS — Z0001 Encounter for general adult medical examination with abnormal findings: Secondary | ICD-10-CM | POA: Diagnosis not present

## 2022-08-28 DIAGNOSIS — Z1389 Encounter for screening for other disorder: Secondary | ICD-10-CM | POA: Diagnosis not present

## 2022-08-28 DIAGNOSIS — Z7689 Persons encountering health services in other specified circumstances: Secondary | ICD-10-CM | POA: Diagnosis not present

## 2022-08-28 DIAGNOSIS — Z1331 Encounter for screening for depression: Secondary | ICD-10-CM | POA: Diagnosis not present

## 2022-08-28 DIAGNOSIS — Z7189 Other specified counseling: Secondary | ICD-10-CM | POA: Diagnosis not present
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
## 2022-09-02 ENCOUNTER — Other Ambulatory Visit: Payer: Self-pay | Admitting: *Deleted

## 2022-09-02 DIAGNOSIS — Z79899 Other long term (current) drug therapy: Secondary | ICD-10-CM | POA: Diagnosis not present

## 2022-09-03 LAB — CBC WITH DIFFERENTIAL/PLATELET
Absolute Monocytes: 648 cells/uL (ref 200–950)
Basophils Absolute: 32 cells/uL (ref 0–200)
Basophils Relative: 0.4 %
Eosinophils Absolute: 480 cells/uL (ref 15–500)
Eosinophils Relative: 6 %
HCT: 33.7 % — ABNORMAL LOW (ref 38.5–50.0)
Hemoglobin: 10.9 g/dL — ABNORMAL LOW (ref 13.2–17.1)
Lymphs Abs: 1016 cells/uL (ref 850–3900)
MCH: 27.8 pg (ref 27.0–33.0)
MCHC: 32.3 g/dL (ref 32.0–36.0)
MCV: 86 fL (ref 80.0–100.0)
MPV: 11.4 fL (ref 7.5–12.5)
Monocytes Relative: 8.1 %
Neutro Abs: 5824 cells/uL (ref 1500–7800)
Neutrophils Relative %: 72.8 %
Platelets: 193 10*3/uL (ref 140–400)
RBC: 3.92 10*6/uL — ABNORMAL LOW (ref 4.20–5.80)
RDW: 13.5 % (ref 11.0–15.0)
Total Lymphocyte: 12.7 %
WBC: 8 10*3/uL (ref 3.8–10.8)

## 2022-09-03 LAB — COMPLETE METABOLIC PANEL WITH GFR
AG Ratio: 1.1 (calc) (ref 1.0–2.5)
ALT: 10 U/L (ref 9–46)
AST: 16 U/L (ref 10–35)
Albumin: 3.9 g/dL (ref 3.6–5.1)
Alkaline phosphatase (APISO): 62 U/L (ref 35–144)
BUN/Creatinine Ratio: 27 (calc) — ABNORMAL HIGH (ref 6–22)
BUN: 18 mg/dL (ref 7–25)
CO2: 30 mmol/L (ref 20–32)
Calcium: 9.5 mg/dL (ref 8.6–10.3)
Chloride: 103 mmol/L (ref 98–110)
Creat: 0.67 mg/dL — ABNORMAL LOW (ref 0.70–1.28)
Globulin: 3.6 g/dL (calc) (ref 1.9–3.7)
Glucose, Bld: 78 mg/dL (ref 65–99)
Potassium: 5.2 mmol/L (ref 3.5–5.3)
Sodium: 141 mmol/L (ref 135–146)
Total Bilirubin: 0.5 mg/dL (ref 0.2–1.2)
Total Protein: 7.5 g/dL (ref 6.1–8.1)
eGFR: 97 mL/min/{1.73_m2} (ref >=60)

## 2022-09-03 NOTE — Progress Notes (Signed)
Hemoglobin is low.  Please advise patient to take multivitamin with iron.  CMP is normal.

## 2022-09-05 ENCOUNTER — Other Ambulatory Visit: Payer: Self-pay | Admitting: Rheumatology

## 2022-09-05 NOTE — Telephone Encounter (Signed)
Next Visit: 09/10/2022  Last Visit: 08/13/2022  Last Fill: 08/16/2022  DX: Inflammatory arthritis    Current Dose per office note 08/13/2022: MTX 20mg  once weekly   Labs: 09/02/2022  Hemoglobin is low.  Please advise patient to take multivitamin with iron.  CMP is normal.   Okay to refill MTX?

## 2022-09-10 ENCOUNTER — Ambulatory Visit: Payer: Medicare Other | Attending: Rheumatology | Admitting: Rheumatology

## 2022-09-10 ENCOUNTER — Encounter: Payer: Self-pay | Admitting: Rheumatology

## 2022-09-10 VITALS — BP 116/66 | HR 61 | Resp 14 | Ht 65.0 in | Wt 142.0 lb

## 2022-09-10 DIAGNOSIS — Z862 Personal history of diseases of the blood and blood-forming organs and certain disorders involving the immune mechanism: Secondary | ICD-10-CM | POA: Diagnosis not present

## 2022-09-10 DIAGNOSIS — M79642 Pain in left hand: Secondary | ICD-10-CM | POA: Diagnosis not present

## 2022-09-10 DIAGNOSIS — Z8679 Personal history of other diseases of the circulatory system: Secondary | ICD-10-CM | POA: Diagnosis not present

## 2022-09-10 DIAGNOSIS — M0579 Rheumatoid arthritis with rheumatoid factor of multiple sites without organ or systems involvement: Secondary | ICD-10-CM | POA: Insufficient documentation

## 2022-09-10 DIAGNOSIS — G5612 Other lesions of median nerve, left upper limb: Secondary | ICD-10-CM | POA: Insufficient documentation

## 2022-09-10 DIAGNOSIS — Z13820 Encounter for screening for osteoporosis: Secondary | ICD-10-CM | POA: Diagnosis not present

## 2022-09-10 DIAGNOSIS — Z8739 Personal history of other diseases of the musculoskeletal system and connective tissue: Secondary | ICD-10-CM

## 2022-09-10 DIAGNOSIS — M25462 Effusion, left knee: Secondary | ICD-10-CM | POA: Diagnosis not present

## 2022-09-10 DIAGNOSIS — M79641 Pain in right hand: Secondary | ICD-10-CM | POA: Diagnosis not present

## 2022-09-10 DIAGNOSIS — Z79899 Other long term (current) drug therapy: Secondary | ICD-10-CM | POA: Diagnosis not present

## 2022-09-10 DIAGNOSIS — M79672 Pain in left foot: Secondary | ICD-10-CM | POA: Insufficient documentation

## 2022-09-10 DIAGNOSIS — M79671 Pain in right foot: Secondary | ICD-10-CM | POA: Diagnosis not present

## 2022-09-10 DIAGNOSIS — I10 Essential (primary) hypertension: Secondary | ICD-10-CM | POA: Diagnosis not present

## 2022-09-10 DIAGNOSIS — Z8709 Personal history of other diseases of the respiratory system: Secondary | ICD-10-CM | POA: Insufficient documentation

## 2022-09-10 DIAGNOSIS — Z87891 Personal history of nicotine dependence: Secondary | ICD-10-CM | POA: Insufficient documentation

## 2022-09-10 DIAGNOSIS — C3412 Malignant neoplasm of upper lobe, left bronchus or lung: Secondary | ICD-10-CM | POA: Insufficient documentation

## 2022-09-10 DIAGNOSIS — I700 Atherosclerosis of aorta: Secondary | ICD-10-CM | POA: Insufficient documentation

## 2022-09-10 NOTE — Patient Instructions (Signed)
Standing Labs We placed an order today for your standing lab work.   Please have your standing labs drawn in 2 weeks and then every 2 months  Please have your labs drawn 2 weeks prior to your appointment so that the provider can discuss your lab results at your appointment.  Please note that you may see your imaging and lab results in [facility] before we have reviewed them. We will contact you once all results are reviewed. Please allow our office up to 72 hours to thoroughly review all of the results before contacting the office for clarification of your results.  Lab hours are:   Monday through Thursday from 8:00 am -12:30 pm and 1:00 pm-5:00 pm and Friday from 8:00 am-12:00 pm.  Please be advised, all patients with office appointments requiring lab work will take precedent over walk-in lab work.   Labs are drawn by [facility]. Please bring your co-pay at the time of your lab draw.  You may receive a bill from [facility] for your lab work.  Please note if you are on Hydroxychloroquine and and an order has been placed for a Hydroxychloroquine level, you will need to have it drawn 4 hours or more after your last dose.  If you wish to have your labs drawn at another location, please call the office 24 hours in advance so we can fax the orders.  The office is located at [address] [place], [place], [place] [address] No appointment is necessary.    If you have any questions regarding directions or hours of operation,  please call [phone].   As a reminder, please drink plenty of water prior to coming for your lab work. Thanks!  Vaccines You are taking a medication(s) that can suppress your immune system.  The following immunizations are recommended: Flu annually Covid-19  RSV Td/Tdap (tetanus, diphtheria, pertussis) every 10 years Pneumonia (Prevnar 15 then Pneumovax 23 at least 1 year apart.  Alternatively, can take Prevnar 20 without needing additional dose) Shingrix: 2 doses from 4  weeks to 6 months apart  Please check with your PCP to make sure you are up to date.   If you have signs or symptoms of an infection or start antibiotics: First, call your PCP for workup of your infection. Hold your medication through the infection, until you complete your antibiotics, and until symptoms resolve if you take the following: Injectable medication (Actemra, Benlysta, Cimzia, Cosentyx, Enbrel, Humira, Kevzara, Orencia, Remicade, Simponi, Stelara, Taltz, Tremfya) Methotrexate Leflunomide (Arava) Mycophenolate (Cellcept) Morrie Sheldon, Olumiant, or Rinvoq

## 2022-09-30 ENCOUNTER — Other Ambulatory Visit: Payer: Self-pay | Admitting: *Deleted

## 2022-09-30 DIAGNOSIS — Z79899 Other long term (current) drug therapy: Secondary | ICD-10-CM

## 2022-10-01 LAB — CBC WITH DIFFERENTIAL/PLATELET
Absolute Monocytes: 585 cells/uL (ref 200–950)
Basophils Absolute: 27 cells/uL (ref 0–200)
Basophils Relative: 0.4 %
Eosinophils Absolute: 571 cells/uL — ABNORMAL HIGH (ref 15–500)
Eosinophils Relative: 8.4 %
HCT: 31.9 % — ABNORMAL LOW (ref 38.5–50.0)
Hemoglobin: 10.2 g/dL — ABNORMAL LOW (ref 13.2–17.1)
Lymphs Abs: 1054 cells/uL (ref 850–3900)
MCH: 27.7 pg (ref 27.0–33.0)
MCHC: 32 g/dL (ref 32.0–36.0)
MCV: 86.7 fL (ref 80.0–100.0)
MPV: 11.9 fL (ref 7.5–12.5)
Monocytes Relative: 8.6 %
Neutro Abs: 4563 cells/uL (ref 1500–7800)
Neutrophils Relative %: 67.1 %
Platelets: 192 10*3/uL (ref 140–400)
RBC: 3.68 10*6/uL — ABNORMAL LOW (ref 4.20–5.80)
RDW: 13.9 % (ref 11.0–15.0)
Total Lymphocyte: 15.5 %
WBC: 6.8 10*3/uL (ref 3.8–10.8)

## 2022-10-01 LAB — COMPLETE METABOLIC PANEL WITH GFR
AG Ratio: 1.3 (calc) (ref 1.0–2.5)
ALT: 14 U/L (ref 9–46)
AST: 18 U/L (ref 10–35)
Albumin: 3.9 g/dL (ref 3.6–5.1)
Alkaline phosphatase (APISO): 57 U/L (ref 35–144)
BUN/Creatinine Ratio: 28 (calc) — ABNORMAL HIGH (ref 6–22)
BUN: 18 mg/dL (ref 7–25)
CO2: 33 mmol/L — ABNORMAL HIGH (ref 20–32)
Calcium: 9.2 mg/dL (ref 8.6–10.3)
Chloride: 103 mmol/L (ref 98–110)
Creat: 0.65 mg/dL — ABNORMAL LOW (ref 0.70–1.28)
Globulin: 2.9 g/dL (calc) (ref 1.9–3.7)
Glucose, Bld: 86 mg/dL (ref 65–99)
Potassium: 5.3 mmol/L (ref 3.5–5.3)
Sodium: 139 mmol/L (ref 135–146)
Total Bilirubin: 0.5 mg/dL (ref 0.2–1.2)
Total Protein: 6.8 g/dL (ref 6.1–8.1)
eGFR: 98 mL/min/{1.73_m2} (ref >=60)

## 2022-10-01 NOTE — Progress Notes (Signed)
CBC shows anemia which is a stable.  CMP is normal.  Please forward results to his PCP.

## 2022-10-03 ENCOUNTER — Ambulatory Visit
Admission: RE | Admit: 2022-10-03 | Discharge: 2022-10-03 | Disposition: A | Discharge status: Home / Self Care | Payer: Medicare Other | Source: Ambulatory Visit | Attending: Internal Medicine | Admitting: Internal Medicine

## 2022-10-03 DIAGNOSIS — M85851 Other specified disorders of bone density and structure, right thigh: Secondary | ICD-10-CM | POA: Diagnosis not present

## 2022-10-03 DIAGNOSIS — M810 Age-related osteoporosis without current pathological fracture: Secondary | ICD-10-CM

## 2022-10-03 DIAGNOSIS — M4005 Postural kyphosis, thoracolumbar region: Secondary | ICD-10-CM

## 2022-10-03 DIAGNOSIS — Z13820 Encounter for screening for osteoporosis: Secondary | ICD-10-CM

## 2022-10-04 ENCOUNTER — Telehealth: Payer: Self-pay | Admitting: Internal Medicine

## 2022-10-04 NOTE — Telephone Encounter (Signed)
Dexa scan checked in setting of kyphosis with several risk factors for osteoporosis.   T-score of 1.5 FRAX elevated with hip fracture risk of 11.1% consistent with osteoporosis. Major osteoporotic fracture <20% at 16.2%.  I called and reviewed results with patient. We talked about possible therapy as well as need to check vitamin D. Calcium was within normal limits on labs 09/30/22. He will follow-up in clinic for iron labs and vitamin D within next few weeks. I have sent message to front desk to help with coordinating this.

## 2022-10-07 DIAGNOSIS — H40013 Open angle with borderline findings, low risk, bilateral: Secondary | ICD-10-CM | POA: Diagnosis not present

## 2022-10-23 ENCOUNTER — Encounter (INDEPENDENT_AMBULATORY_CARE_PROVIDER_SITE_OTHER): Payer: Self-pay | Admitting: Ophthalmology

## 2022-10-23 ENCOUNTER — Ambulatory Visit (INDEPENDENT_AMBULATORY_CARE_PROVIDER_SITE_OTHER): Payer: Medicare Other | Admitting: Ophthalmology

## 2022-10-23 DIAGNOSIS — H40052 Ocular hypertension, left eye: Secondary | ICD-10-CM | POA: Diagnosis not present

## 2022-10-23 DIAGNOSIS — I10 Essential (primary) hypertension: Secondary | ICD-10-CM

## 2022-10-23 DIAGNOSIS — H35372 Puckering of macula, left eye: Secondary | ICD-10-CM | POA: Diagnosis not present

## 2022-10-23 DIAGNOSIS — H15012 Anterior scleritis, left eye: Secondary | ICD-10-CM | POA: Diagnosis not present

## 2022-10-23 DIAGNOSIS — H35352 Cystoid macular degeneration, left eye: Secondary | ICD-10-CM | POA: Diagnosis not present

## 2022-10-23 DIAGNOSIS — Z961 Presence of intraocular lens: Secondary | ICD-10-CM | POA: Diagnosis not present

## 2022-10-23 DIAGNOSIS — H43813 Vitreous degeneration, bilateral: Secondary | ICD-10-CM

## 2022-10-23 DIAGNOSIS — H35033 Hypertensive retinopathy, bilateral: Secondary | ICD-10-CM | POA: Diagnosis not present
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
## 2022-10-28 ENCOUNTER — Other Ambulatory Visit: Payer: Self-pay | Admitting: Internal Medicine

## 2022-10-28 DIAGNOSIS — M05731 Rheumatoid arthritis with rheumatoid factor of right wrist without organ or systems involvement: Secondary | ICD-10-CM

## 2022-10-28 NOTE — Progress Notes (Signed)
Internal Medicine Clinic Attending  Case and documentation of Dr. Masters  reviewed.  I reviewed the AWV findings.  I agree with the assessment, diagnosis, and plan of care documented in the AWV note.     
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
## 2022-11-04 ENCOUNTER — Ambulatory Visit (INDEPENDENT_AMBULATORY_CARE_PROVIDER_SITE_OTHER): Payer: Medicare Other | Admitting: Student

## 2022-11-04 VITALS — BP 118/60 | HR 72 | Temp 98.1°F | Ht 60.5 in | Wt 139.9 lb

## 2022-11-04 DIAGNOSIS — D509 Iron deficiency anemia, unspecified: Secondary | ICD-10-CM | POA: Diagnosis not present

## 2022-11-04 DIAGNOSIS — I10 Essential (primary) hypertension: Secondary | ICD-10-CM | POA: Diagnosis not present

## 2022-11-04 DIAGNOSIS — M85851 Other specified disorders of bone density and structure, right thigh: Secondary | ICD-10-CM | POA: Insufficient documentation

## 2022-11-04 DIAGNOSIS — C3412 Malignant neoplasm of upper lobe, left bronchus or lung: Secondary | ICD-10-CM

## 2022-11-04 NOTE — Assessment & Plan Note (Signed)
Somewhat hypotensive on exam today.  89/50.  Blood pressure improved on recheck, no orthostatic changes although he did report a "blood draining" sensation when he sat up from lying down flat.  Blood pressures well-controlled at home, even prior to administration of morning bisoprolol.  Will discontinue this medicine in light of today's BP.  Can liberalize salt and fluid restriction somewhat.  Want to maintain a good blood pressure for this person with osteopenia and increased fracture risk. 

## 2022-11-04 NOTE — Assessment & Plan Note (Deleted)
Somewhat hypotensive on exam today.  89/50.  Blood pressure improved on recheck, no orthostatic changes although he did report a "blood draining" sensation when he sat up from lying down flat.  Blood pressures well-controlled at home, even prior to administration of morning bisoprolol.  Will discontinue this medicine in light of today's BP.  Can liberalize salt and fluid restriction somewhat.  Want to maintain a good blood pressure for this person with osteopenia and increased fracture risk. 

## 2022-11-04 NOTE — Assessment & Plan Note (Deleted)
Somewhat hypotensive on exam today.  89/50.  Blood pressure improved on recheck, no orthostatic changes although he did report a "blood draining" sensation when he sat up from lying down flat.  Blood pressures well-controlled at home, even prior to administration of morning bisoprolol.  Will discontinue this medicine in light of today's BP.  Can liberalize salt and fluid restriction somewhat.  Want to maintain a good blood pressure for this person with osteopenia and increased fracture risk.

## 2022-11-04 NOTE — Assessment & Plan Note (Signed)
No recent bleeding.  Activity and exercise tolerance has improved since starting iron supplementation.  No concerns with constipation.  Will repeat iron studies today.

## 2022-11-04 NOTE — Progress Notes (Signed)
    Subjective:  [name] is a 77 y.o. who presents to clinic for the following:  Follow-up  Overall doing well.  No new concerns since last visit.  Taking iron supplements as prescribed.  Activity and exercise tolerance seems to have improved somewhat since starting this medicine.  Stable dyspnea on exertion.  Using nighttime oxygen.  Using albuterol every other day on average.  BP measured prior to coming to the clinic was 123/68.  Normally in the 110s before taking blood pressure medicine.  No recent falls.  Review of Systems  Respiratory:  Negative for hemoptysis.   Cardiovascular:  Negative for chest pain, palpitations and leg swelling.  Gastrointestinal:  Negative for blood in stool and constipation.   Objective:   Vitals:   11/04/22 1311 11/04/22 1423 11/04/22 1424 11/04/22 1425  BP: (!) 89/50 (!) 110/55 (!) 115/59 118/60  Pulse: 75 67 67 72  Temp: 98.1 F (36.7 C)     TempSrc: Oral     SpO2: 94%     Weight: 139 lb 14.4 oz (63.5 kg)     Height: 5' 0.5" (1.537 m)       Physical Exam Well-appearing, slight build, somewhat stooped No cervical lymphadenopathy Heart rate normal, rhythm is regular, strong radial pulses, no murmurs Breathing is regular and unlabored on room air, no wheezing or crackles Abdomen soft and nontender Skin is warm, dry, and pale Alert and oriented Pleasant, affect concordant  Assessment & Plan:  The primary encounter diagnosis was Iron deficiency anemia, unspecified iron deficiency anemia type. Diagnoses of Osteopenia of neck of right femur and Primary hypertension were also pertinent to this visit.  Hypertension Somewhat hypotensive on exam today.  89/50.  Blood pressure improved on recheck, no orthostatic changes although he did report a "blood draining" sensation when he sat up from lying down flat.  Blood pressures well-controlled at home, even prior to administration of morning bisoprolol.  Will discontinue this medicine in light  of today's BP.  Can liberalize salt and fluid restriction somewhat.  Want to maintain a good blood pressure for this person with osteopenia and increased fracture risk.  Osteopenia of neck of right femur T-score -1.5 at right femoral neck.  Major osteoporotic fracture risk is 16%.  No recent falls.  No indication for bisphosphonates at this time.  Will check vitamin D today.  Manage fall risk by optimizing blood pressure.  Iron deficiency anemia No recent bleeding.  Activity and exercise tolerance has improved since starting iron supplementation.  No concerns with constipation.  Will repeat iron studies today.    Return in 3 months, for routine follow-up.  Patient discussed with Dr. Carmela Hurt MD 11/04/2022, 2:35 PM  Pager: [phone]

## 2022-11-04 NOTE — Assessment & Plan Note (Signed)
T-score -1.5 at right femoral neck.  Major osteoporotic fracture risk is 16%.  No recent falls.  No indication for bisphosphonates at this time.  Will check vitamin D today.  Manage fall risk by optimizing blood pressure.

## 2022-11-04 NOTE — Patient Instructions (Addendum)
Today we discussed iron deficiency and bone health.  Because your blood pressure is normal and blood pressure medicine can increase risk of falling, I recommend stopping your bisoprolol.  Return in 3 months, for routine follow-up.   I will call you with the results of the following laboratory test(s):  Lab Orders         Iron, TIBC and Ferritin Panel         Vitamin D (25 hydroxy)     Please call our clinic at [phone] Monday through Friday from 9 am to 4 pm if you have questions or concerns about your health. If after hours or on the weekend, call the main hospital number and ask for the Internal Medicine Resident On-Call. If you need medication refills, please notify your pharmacy one week in advance and they will send [place] a request.   Best, Marrianne Mood, MD [facility]

## 2022-11-06 LAB — IRON,TIBC AND FERRITIN PANEL
Ferritin: 256 ng/mL (ref 30–400)
Iron Saturation: 21 % (ref 15–55)
Iron: 64 ug/dL (ref 38–169)
Total Iron Binding Capacity: 312 ug/dL (ref 250–450)
UIBC: 248 ug/dL (ref 111–343)

## 2022-11-06 LAB — VITAMIN D 25 HYDROXY (VIT D DEFICIENCY, FRACTURES): Vit D, 25-Hydroxy: 35.7 ng/mL (ref 30.0–100.0)

## 2022-11-06 NOTE — Progress Notes (Signed)
Iron and vitamin D replete.  Recommend discontinuing iron supplement.  Can continue vitamin D supplement.

## 2022-11-08 NOTE — Progress Notes (Signed)
Internal Medicine Clinic Attending  Case discussed with Dr. Benito Mccreedy  At the time of the visit.  We reviewed the resident's history and exam and pertinent patient test results.  I agree with the assessment, diagnosis, and plan of care documented in the resident's note. Recommend a nurse only visit next week for BP check.

## 2022-11-12 ENCOUNTER — Ambulatory Visit: Payer: Medicare Other | Attending: Rheumatology | Admitting: Rheumatology

## 2022-11-12 ENCOUNTER — Ambulatory Visit (INDEPENDENT_AMBULATORY_CARE_PROVIDER_SITE_OTHER): Payer: Medicare Other

## 2022-11-12 ENCOUNTER — Encounter: Payer: Self-pay | Admitting: Rheumatology

## 2022-11-12 VITALS — BP 111/62 | HR 71 | Ht 65.0 in | Wt 139.0 lb

## 2022-11-12 DIAGNOSIS — M25531 Pain in right wrist: Secondary | ICD-10-CM | POA: Diagnosis not present

## 2022-11-12 DIAGNOSIS — M79672 Pain in left foot: Secondary | ICD-10-CM | POA: Diagnosis not present

## 2022-11-12 DIAGNOSIS — I10 Essential (primary) hypertension: Secondary | ICD-10-CM | POA: Diagnosis not present

## 2022-11-12 DIAGNOSIS — M0579 Rheumatoid arthritis with rheumatoid factor of multiple sites without organ or systems involvement: Secondary | ICD-10-CM | POA: Insufficient documentation

## 2022-11-12 DIAGNOSIS — G5612 Other lesions of median nerve, left upper limb: Secondary | ICD-10-CM | POA: Insufficient documentation

## 2022-11-12 DIAGNOSIS — M25431 Effusion, right wrist: Secondary | ICD-10-CM

## 2022-11-12 DIAGNOSIS — Z13820 Encounter for screening for osteoporosis: Secondary | ICD-10-CM

## 2022-11-12 DIAGNOSIS — M79642 Pain in left hand: Secondary | ICD-10-CM | POA: Diagnosis not present

## 2022-11-12 DIAGNOSIS — M79641 Pain in right hand: Secondary | ICD-10-CM | POA: Insufficient documentation

## 2022-11-12 DIAGNOSIS — M8589 Other specified disorders of bone density and structure, multiple sites: Secondary | ICD-10-CM | POA: Diagnosis not present

## 2022-11-12 DIAGNOSIS — Z87891 Personal history of nicotine dependence: Secondary | ICD-10-CM | POA: Diagnosis not present

## 2022-11-12 DIAGNOSIS — Z8679 Personal history of other diseases of the circulatory system: Secondary | ICD-10-CM | POA: Diagnosis not present

## 2022-11-12 DIAGNOSIS — C3412 Malignant neoplasm of upper lobe, left bronchus or lung: Secondary | ICD-10-CM | POA: Diagnosis not present

## 2022-11-12 DIAGNOSIS — M25462 Effusion, left knee: Secondary | ICD-10-CM | POA: Insufficient documentation

## 2022-11-12 DIAGNOSIS — Z862 Personal history of diseases of the blood and blood-forming organs and certain disorders involving the immune mechanism: Secondary | ICD-10-CM | POA: Diagnosis not present

## 2022-11-12 DIAGNOSIS — M79671 Pain in right foot: Secondary | ICD-10-CM | POA: Diagnosis not present

## 2022-11-12 DIAGNOSIS — Z8709 Personal history of other diseases of the respiratory system: Secondary | ICD-10-CM | POA: Diagnosis not present

## 2022-11-12 DIAGNOSIS — Z8739 Personal history of other diseases of the musculoskeletal system and connective tissue: Secondary | ICD-10-CM | POA: Insufficient documentation

## 2022-11-12 DIAGNOSIS — Z79899 Other long term (current) drug therapy: Secondary | ICD-10-CM | POA: Diagnosis not present

## 2022-11-12 DIAGNOSIS — I700 Atherosclerosis of aorta: Secondary | ICD-10-CM | POA: Diagnosis not present

## 2022-11-12 MED ORDER — TRIAMCINOLONE ACETONIDE 40 MG/ML IJ SUSP
20.0000 mg | INTRAMUSCULAR | Status: AC | PRN
Start: 2022-11-12 — End: 2022-11-12
  Administered 2022-11-12: 20 mg via INTRA_ARTICULAR

## 2022-11-12 MED ORDER — LIDOCAINE HCL 1 % IJ SOLN
1.0000 mL | INTRAMUSCULAR | Status: AC | PRN
Start: 2022-11-12 — End: 2022-11-12
  Administered 2022-11-12: 1 mL

## 2022-11-12 NOTE — Patient Instructions (Signed)
Standing Labs We placed an order today for your standing lab work.   Please have your standing labs drawn in June and every 3 months  Please have your labs drawn 2 weeks prior to your appointment so that the provider can discuss your lab results at your appointment, if possible.  Please note that you may see your imaging and lab results in MyChart before we have reviewed them. We will contact you once all results are reviewed. Please allow our office up to 72 hours to thoroughly review all of the results before contacting the office for clarification of your results.  WALK-IN LAB HOURS  Monday through Thursday from 8:00 am -12:30 pm and 1:00 pm-5:00 pm and Friday from 8:00 am-12:00 pm.  Patients with office visits requiring labs will be seen before walk-in labs.  You may encounter longer than normal wait times. Please allow additional time. Wait times may be shorter on  Monday and Thursday afternoons.  We do not book appointments for walk-in labs. We appreciate your patience and understanding with our staff.   Labs are drawn by [facility]. Please bring your co-pay at the time of your lab draw.  You may receive a bill from [facility] for your lab work.  Please note if you are on Hydroxychloroquine and and an order has been placed for a Hydroxychloroquine level,  you will need to have it drawn 4 hours or more after your last dose.  If you wish to have your labs drawn at another location, please call the office 24 hours in advance so we can fax the orders.  The office is located at [address], [address], [place], [place] [address]   If you have any questions regarding directions or hours of operation,  please call [phone].   As a reminder, please drink plenty of water prior to coming for your lab work. Thanks!  Vaccines You are taking a medication(s) that can suppress your immune system.  The following immunizations are recommended: Flu annually Covid-19  Td/Tdap (tetanus, diphtheria,  pertussis) every 10 years Pneumonia (Prevnar 15 then Pneumovax 23 at least 1 year apart.  Alternatively, can take Prevnar 20 without needing additional dose) Shingrix: 2 doses from 4 weeks to 6 months apart  Please check with your PCP to make sure you are up to date.   If you have signs or symptoms of an infection or start antibiotics: First, call your PCP for workup of your infection. Hold your medication through the infection, until you complete your antibiotics, and until symptoms resolve if you take the following: Injectable medication (Actemra, Benlysta, Cimzia, Cosentyx, Enbrel, Humira, Kevzara, Orencia, Remicade, Simponi, Stelara, Taltz, Tremfya) Methotrexate Leflunomide (Arava) Mycophenolate (Cellcept) Xeljanz, Olumiant, or Rinvoq  

## 2022-12-09 ENCOUNTER — Other Ambulatory Visit: Payer: Self-pay | Admitting: Physician Assistant

## 2022-12-09 NOTE — Telephone Encounter (Signed)
Last Fill: 09/05/2022  Labs: 09/30/2022 CBC shows anemia which is a stable.  CMP is normal.   Next Visit: 02/13/2023  Last Visit: 11/12/2022  DX: Rheumatoid arthritis involving multiple sites with positive rheumatoid factor   Current Dose per office note 11/12/2022: methotrexate 8 tablets p.o. weekly   Okay to refill Methotrexate?

## 2023-01-20 ENCOUNTER — Telehealth: Payer: Self-pay | Admitting: *Deleted

## 2023-01-20 NOTE — Telephone Encounter (Signed)
Called patient to ask about having his telephone fu on 01-22-23, spoke with patient and he agreed to do so

## 2023-01-21 ENCOUNTER — Ambulatory Visit (HOSPITAL_COMMUNITY)
Admission: RE | Admit: 2023-01-21 | Discharge: 2023-01-21 | Disposition: A | Discharge status: Home / Self Care | Payer: Medicare Other | Source: Ambulatory Visit | Attending: Radiation Oncology | Admitting: Radiation Oncology

## 2023-01-21 ENCOUNTER — Other Ambulatory Visit: Payer: Self-pay | Admitting: *Deleted

## 2023-01-21 DIAGNOSIS — J432 Centrilobular emphysema: Secondary | ICD-10-CM | POA: Diagnosis not present

## 2023-01-21 DIAGNOSIS — Z79899 Other long term (current) drug therapy: Secondary | ICD-10-CM

## 2023-01-21 DIAGNOSIS — C3412 Malignant neoplasm of upper lobe, left bronchus or lung: Secondary | ICD-10-CM | POA: Insufficient documentation

## 2023-01-21 DIAGNOSIS — I700 Atherosclerosis of aorta: Secondary | ICD-10-CM | POA: Diagnosis not present

## 2023-01-21 DIAGNOSIS — C3490 Malignant neoplasm of unspecified part of unspecified bronchus or lung: Secondary | ICD-10-CM | POA: Diagnosis not present

## 2023-01-21 DIAGNOSIS — R590 Localized enlarged lymph nodes: Secondary | ICD-10-CM | POA: Diagnosis not present

## 2023-01-21 LAB — CBC WITH DIFFERENTIAL/PLATELET
Basophils Absolute: 28 cells/uL (ref 0–200)
Eosinophils Relative: 9.6 %
HCT: 32.8 % — ABNORMAL LOW (ref 38.5–50.0)
Lymphs Abs: 973 cells/uL (ref 850–3900)
MCHC: 32.9 g/dL (ref 32.0–36.0)
Neutrophils Relative %: 66.5 %
RDW: 13.8 % (ref 11.0–15.0)
WBC: 6.9 10*3/uL (ref 3.8–10.8)

## 2023-01-21 LAB — POCT I-STAT CREATININE: Creatinine, Ser: 0.8 mg/dL (ref 0.61–1.24)

## 2023-01-21 MED ORDER — IOHEXOL 300 MG/ML  SOLN
75.0000 mL | Freq: Once | INTRAMUSCULAR | Status: AC | PRN
  Administered 2023-01-21: 75 mL via INTRAVENOUS

## 2023-01-22 ENCOUNTER — Encounter: Payer: Self-pay | Admitting: Radiation Oncology

## 2023-01-22 ENCOUNTER — Ambulatory Visit
Admission: RE | Admit: 2023-01-22 | Discharge: 2023-01-22 | Disposition: A | Discharge status: Home / Self Care | Payer: Medicare Other | Source: Ambulatory Visit | Attending: Radiation Oncology | Admitting: Radiation Oncology

## 2023-01-22 VITALS — Ht 65.0 in | Wt 138.0 lb

## 2023-01-22 DIAGNOSIS — C3412 Malignant neoplasm of upper lobe, left bronchus or lung: Secondary | ICD-10-CM | POA: Diagnosis not present

## 2023-01-22 DIAGNOSIS — Z87891 Personal history of nicotine dependence: Secondary | ICD-10-CM | POA: Diagnosis not present

## 2023-01-22 DIAGNOSIS — C3490 Malignant neoplasm of unspecified part of unspecified bronchus or lung: Secondary | ICD-10-CM

## 2023-01-22 LAB — COMPLETE METABOLIC PANEL WITH GFR
AG Ratio: 1.6 (calc) (ref 1.0–2.5)
ALT: 16 U/L (ref 9–46)
AST: 19 U/L (ref 10–35)
Albumin: 4.1 g/dL (ref 3.6–5.1)
Alkaline phosphatase (APISO): 58 U/L (ref 35–144)
BUN/Creatinine Ratio: 29 (calc) — ABNORMAL HIGH (ref 6–22)
BUN: 19 mg/dL (ref 7–25)
CO2: 33 mmol/L — ABNORMAL HIGH (ref 20–32)
Calcium: 9.3 mg/dL (ref 8.6–10.3)
Chloride: 100 mmol/L (ref 98–110)
Creat: 0.65 mg/dL — ABNORMAL LOW (ref 0.70–1.28)
Globulin: 2.5 g/dL (calc) (ref 1.9–3.7)
Glucose, Bld: 79 mg/dL (ref 65–99)
Potassium: 4.9 mmol/L (ref 3.5–5.3)
Sodium: 139 mmol/L (ref 135–146)
Total Bilirubin: 0.5 mg/dL (ref 0.2–1.2)
Total Protein: 6.6 g/dL (ref 6.1–8.1)
eGFR: 97 mL/min/{1.73_m2} (ref >=60)

## 2023-01-22 LAB — CBC WITH DIFFERENTIAL/PLATELET
Absolute Monocytes: 649 cells/uL (ref 200–950)
Basophils Relative: 0.4 %
Eosinophils Absolute: 662 cells/uL — ABNORMAL HIGH (ref 15–500)
Hemoglobin: 10.8 g/dL — ABNORMAL LOW (ref 13.2–17.1)
MCH: 30.3 pg (ref 27.0–33.0)
MCV: 92.1 fL (ref 80.0–100.0)
MPV: 11.4 fL (ref 7.5–12.5)
Monocytes Relative: 9.4 %
Neutro Abs: 4589 cells/uL (ref 1500–7800)
Platelets: 194 10*3/uL (ref 140–400)
RBC: 3.56 10*6/uL — ABNORMAL LOW (ref 4.20–5.80)
Total Lymphocyte: 14.1 %
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
## 2023-01-22 NOTE — Progress Notes (Signed)
CBC and CMP are stable.  Hemoglobin is low and stable.  Will forward results to Dr.Masters.

## 2023-01-22 NOTE — Progress Notes (Signed)
Telephone nursing appointment for patient to review most recent scan results from 01/21/2023. I verified patient's identity x2 and began nursing interview.   Patient reports NO discomfort or SOB at this time.  Meaningful use complete.   Patient aware of their 2:00pm-01/22/2023 telephone appointment w/ Laurence Aly PA-C. I left my extension [phone] in case patient needs anything. Patient verbalized understanding. This concludes the nursing interview.   Patient contact [phone]     Ruel Favors, LPN

## 2023-01-27 ENCOUNTER — Ambulatory Visit: Payer: Self-pay | Admitting: Radiation Oncology
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
## 2023-02-10 ENCOUNTER — Encounter (HOSPITAL_COMMUNITY)
Admission: RE | Admit: 2023-02-10 | Discharge: 2023-02-10 | Disposition: A | Discharge status: Home / Self Care | Payer: Medicare Other | Source: Ambulatory Visit | Attending: Radiation Oncology | Admitting: Radiation Oncology

## 2023-02-10 DIAGNOSIS — I2510 Atherosclerotic heart disease of native coronary artery without angina pectoris: Secondary | ICD-10-CM | POA: Diagnosis not present

## 2023-02-10 DIAGNOSIS — I700 Atherosclerosis of aorta: Secondary | ICD-10-CM | POA: Diagnosis not present

## 2023-02-10 DIAGNOSIS — C3412 Malignant neoplasm of upper lobe, left bronchus or lung: Secondary | ICD-10-CM | POA: Insufficient documentation

## 2023-02-10 DIAGNOSIS — C3490 Malignant neoplasm of unspecified part of unspecified bronchus or lung: Secondary | ICD-10-CM | POA: Diagnosis not present

## 2023-02-10 DIAGNOSIS — R590 Localized enlarged lymph nodes: Secondary | ICD-10-CM | POA: Diagnosis not present

## 2023-02-10 LAB — GLUCOSE, CAPILLARY: Glucose-Capillary: 84 mg/dL (ref 70–99)

## 2023-02-10 MED ORDER — FLUDEOXYGLUCOSE F - 18 (FDG) INJECTION
7.0000 | Freq: Once | INTRAVENOUS | Status: AC | PRN
  Administered 2023-02-10: 6.89 via INTRAVENOUS

## 2023-02-11 ENCOUNTER — Encounter: Payer: Self-pay | Admitting: Rheumatology

## 2023-02-11 ENCOUNTER — Ambulatory Visit: Payer: Medicare Other | Attending: Rheumatology | Admitting: Rheumatology

## 2023-02-11 VITALS — BP 114/74 | HR 71 | Resp 17 | Ht 65.0 in | Wt 141.0 lb

## 2023-02-11 DIAGNOSIS — M79671 Pain in right foot: Secondary | ICD-10-CM | POA: Diagnosis not present

## 2023-02-11 DIAGNOSIS — M79642 Pain in left hand: Secondary | ICD-10-CM | POA: Insufficient documentation

## 2023-02-11 DIAGNOSIS — M79672 Pain in left foot: Secondary | ICD-10-CM | POA: Insufficient documentation

## 2023-02-11 DIAGNOSIS — M25431 Effusion, right wrist: Secondary | ICD-10-CM

## 2023-02-11 DIAGNOSIS — M8589 Other specified disorders of bone density and structure, multiple sites: Secondary | ICD-10-CM | POA: Insufficient documentation

## 2023-02-11 DIAGNOSIS — I700 Atherosclerosis of aorta: Secondary | ICD-10-CM | POA: Insufficient documentation

## 2023-02-11 DIAGNOSIS — Z87891 Personal history of nicotine dependence: Secondary | ICD-10-CM | POA: Diagnosis not present

## 2023-02-11 DIAGNOSIS — M0579 Rheumatoid arthritis with rheumatoid factor of multiple sites without organ or systems involvement: Secondary | ICD-10-CM | POA: Insufficient documentation

## 2023-02-11 DIAGNOSIS — C3412 Malignant neoplasm of upper lobe, left bronchus or lung: Secondary | ICD-10-CM | POA: Diagnosis not present

## 2023-02-11 DIAGNOSIS — Z79899 Other long term (current) drug therapy: Secondary | ICD-10-CM | POA: Insufficient documentation

## 2023-02-11 DIAGNOSIS — Z8669 Personal history of other diseases of the nervous system and sense organs: Secondary | ICD-10-CM | POA: Insufficient documentation

## 2023-02-11 DIAGNOSIS — M79641 Pain in right hand: Secondary | ICD-10-CM | POA: Diagnosis not present

## 2023-02-11 DIAGNOSIS — R21 Rash and other nonspecific skin eruption: Secondary | ICD-10-CM | POA: Diagnosis not present

## 2023-02-11 DIAGNOSIS — Z862 Personal history of diseases of the blood and blood-forming organs and certain disorders involving the immune mechanism: Secondary | ICD-10-CM | POA: Insufficient documentation

## 2023-02-11 DIAGNOSIS — Z8679 Personal history of other diseases of the circulatory system: Secondary | ICD-10-CM | POA: Diagnosis not present

## 2023-02-11 DIAGNOSIS — Z8739 Personal history of other diseases of the musculoskeletal system and connective tissue: Secondary | ICD-10-CM | POA: Insufficient documentation

## 2023-02-11 DIAGNOSIS — G5612 Other lesions of median nerve, left upper limb: Secondary | ICD-10-CM | POA: Insufficient documentation

## 2023-02-11 DIAGNOSIS — M25462 Effusion, left knee: Secondary | ICD-10-CM

## 2023-02-11 DIAGNOSIS — Z8709 Personal history of other diseases of the respiratory system: Secondary | ICD-10-CM | POA: Insufficient documentation

## 2023-02-11 DIAGNOSIS — I10 Essential (primary) hypertension: Secondary | ICD-10-CM | POA: Insufficient documentation

## 2023-02-11 NOTE — Patient Instructions (Signed)
Standing Labs We placed an order today for your standing lab work.   Please have your standing labs drawn in October and every 3 months  Please have your labs drawn 2 weeks prior to your appointment so that the provider can discuss your lab results at your appointment, if possible.  Please note that you may see your imaging and lab results in MyChart before we have reviewed them. We will contact you once all results are reviewed. Please allow our office up to 72 hours to thoroughly review all of the results before contacting the office for clarification of your results.  WALK-IN LAB HOURS  Monday through Thursday from 8:00 am -12:30 pm and 1:00 pm-5:00 pm and Friday from 8:00 am-12:00 pm.  Patients with office visits requiring labs will be seen before walk-in labs.  You may encounter longer than normal wait times. Please allow additional time. Wait times may be shorter on  Monday and Thursday afternoons.  We do not book appointments for walk-in labs. We appreciate your patience and understanding with our staff.   Labs are drawn by [facility]. Please bring your co-pay at the time of your lab draw.  You may receive a bill from [facility] for your lab work.  Please note if you are on Hydroxychloroquine and and an order has been placed for a Hydroxychloroquine level,  you will need to have it drawn 4 hours or more after your last dose.  If you wish to have your labs drawn at another location, please call the office 24 hours in advance so we can fax the orders.  The office is located at [address], [place], [place] [address]   If you have any questions regarding directions or hours of operation,  please call [phone].   As a reminder, please drink plenty of water prior to coming for your lab work. Thanks!   Vaccines You are taking a medication(s) that can suppress your immune system.  The following immunizations are recommended: Flu annually Covid-19  RSV Td/Tdap (tetanus,  diphtheria, pertussis) every 10 years Pneumonia (Prevnar 15 then Pneumovax 23 at least 1 year apart.  Alternatively, can take Prevnar 20 without needing additional dose) Shingrix: 2 doses from 4 weeks to 6 months apart  Please check with your PCP to make sure you are up to date.   If you have signs or symptoms of an infection or start antibiotics: First, call your PCP for workup of your infection. Hold your medication through the infection, until you complete your antibiotics, and until symptoms resolve if you take the following: Injectable medication (Actemra, Benlysta, Cimzia, Cosentyx, Enbrel, Humira, Kevzara, Orencia, Remicade, Simponi, Stelara, Taltz, Tremfya) Methotrexate Leflunomide (Arava) Mycophenolate (Cellcept) Harriette Ohara, Olumiant, or Rinvoq

## 2023-02-13 ENCOUNTER — Ambulatory Visit: Payer: Medicare Other | Admitting: Rheumatology

## 2023-02-17 ENCOUNTER — Telehealth: Payer: Self-pay | Admitting: Radiation Oncology

## 2023-02-17 NOTE — Telephone Encounter (Signed)
I spoke with the patient about his PET scan results and we discussed the activity in the recently changed nodule in the LUL below the area of his previous SBRT. This is suspicious for a new Stage I lung cancer, and would be clinically staged as Stage IA2, cT1bN0M0, NSCLC, of the LUL. We discussed the use of SBRT to this site in 3-5 fractions.  We discussed the risks, benefits, short, and long term effects of radiotherapy, as well as the curative intent, and the patient is interested in proceeding. The patient will be contacted to coordinate treatment planning by our simulation department.

## 2023-02-26 ENCOUNTER — Telehealth: Payer: Self-pay | Admitting: *Deleted

## 2023-02-26 ENCOUNTER — Other Ambulatory Visit: Payer: Self-pay

## 2023-02-26 ENCOUNTER — Ambulatory Visit
Admission: RE | Admit: 2023-02-26 | Discharge: 2023-02-26 | Disposition: A | Discharge status: Home / Self Care | Payer: Medicare Other | Source: Ambulatory Visit | Attending: Radiation Oncology | Admitting: Radiation Oncology

## 2023-02-26 DIAGNOSIS — C3412 Malignant neoplasm of upper lobe, left bronchus or lung: Secondary | ICD-10-CM | POA: Insufficient documentation

## 2023-02-26 DIAGNOSIS — Z510 Encounter for antineoplastic radiation therapy: Secondary | ICD-10-CM | POA: Insufficient documentation

## 2023-02-26 DIAGNOSIS — Z87891 Personal history of nicotine dependence: Secondary | ICD-10-CM | POA: Insufficient documentation

## 2023-02-26 NOTE — Telephone Encounter (Signed)
I returned patient's call.  Patient states that the radiation oncologist advised him to stop methotrexate 1 week prior to the radiation therapy and continue to hold for 1 week after the radiation therapy.  I advised patient to follow the recommendations of the radiation oncologist.  He voiced understanding.

## 2023-02-26 NOTE — Telephone Encounter (Addendum)
Patient contacted the office stating he will be starting radiation treatments. Patient states he is on on MTX. Patient states he was advised by oncology that he will need to stop the MTX 1 week prior to starting radiation, hold it during the radiation treatments and will needs to hold for 1 weeks after. Patient states he is due to have radiation from August 20-March 22, 2023. Patient states his next dose of MTX is due February 27, 2023. Patient would like to verify that he is okay to take his dose tomorrow and then skip the dose that would be due on 03/06/2023. Patient would also like to know if you would like him to continue the folic acid while holding the MTX or can he hold that as well.

## 2023-03-06 DIAGNOSIS — C3412 Malignant neoplasm of upper lobe, left bronchus or lung: Secondary | ICD-10-CM | POA: Diagnosis not present

## 2023-03-06 DIAGNOSIS — Z87891 Personal history of nicotine dependence: Secondary | ICD-10-CM | POA: Diagnosis not present

## 2023-03-06 DIAGNOSIS — Z510 Encounter for antineoplastic radiation therapy: Secondary | ICD-10-CM | POA: Diagnosis not present

## 2023-03-11 ENCOUNTER — Other Ambulatory Visit: Payer: Self-pay

## 2023-03-11 ENCOUNTER — Ambulatory Visit
Admission: RE | Admit: 2023-03-11 | Discharge: 2023-03-11 | Disposition: A | Discharge status: Home / Self Care | Payer: Medicare Other | Source: Ambulatory Visit | Attending: Radiation Oncology | Admitting: Radiation Oncology

## 2023-03-11 DIAGNOSIS — Z87891 Personal history of nicotine dependence: Secondary | ICD-10-CM | POA: Diagnosis not present

## 2023-03-11 DIAGNOSIS — Z510 Encounter for antineoplastic radiation therapy: Secondary | ICD-10-CM | POA: Diagnosis not present

## 2023-03-11 DIAGNOSIS — C3412 Malignant neoplasm of upper lobe, left bronchus or lung: Secondary | ICD-10-CM | POA: Diagnosis not present

## 2023-03-11 LAB — RAD ONC ARIA SESSION SUMMARY
Course Elapsed Days: 0
Plan Fractions Treated to Date: 1
Plan Prescribed Dose Per Fraction: 18 Gy
Plan Total Fractions Prescribed: 3
Plan Total Prescribed Dose: 54 Gy
Reference Point Dosage Given to Date: 18 Gy
Reference Point Session Dosage Given: 18 Gy
Session Number: 1

## 2023-03-12 ENCOUNTER — Ambulatory Visit: Payer: Medicare Other | Admitting: Radiation Oncology

## 2023-03-13 ENCOUNTER — Ambulatory Visit: Payer: Medicare Other

## 2023-03-13 ENCOUNTER — Ambulatory Visit
Admission: RE | Admit: 2023-03-13 | Discharge: 2023-03-13 | Disposition: A | Discharge status: Home / Self Care | Payer: Medicare Other | Source: Ambulatory Visit | Attending: Radiation Oncology | Admitting: Radiation Oncology

## 2023-03-13 ENCOUNTER — Other Ambulatory Visit: Payer: Self-pay

## 2023-03-13 DIAGNOSIS — C3412 Malignant neoplasm of upper lobe, left bronchus or lung: Secondary | ICD-10-CM | POA: Diagnosis not present

## 2023-03-13 DIAGNOSIS — Z510 Encounter for antineoplastic radiation therapy: Secondary | ICD-10-CM | POA: Diagnosis not present

## 2023-03-13 DIAGNOSIS — Z87891 Personal history of nicotine dependence: Secondary | ICD-10-CM | POA: Diagnosis not present

## 2023-03-13 LAB — RAD ONC ARIA SESSION SUMMARY
Course Elapsed Days: 2
Plan Fractions Treated to Date: 2
Plan Prescribed Dose Per Fraction: 18 Gy
Plan Total Fractions Prescribed: 3
Plan Total Prescribed Dose: 54 Gy
Reference Point Dosage Given to Date: 36 Gy
Reference Point Session Dosage Given: 18 Gy
Session Number: 2

## 2023-03-13 NOTE — Progress Notes (Signed)
  Radiation Oncology         [phone] ________________________________  Name: Jason Carter MRN: [id number]  Date: 03/13/2023  DOB: [birth year]  Treatment Note  Diagnosis:   Stage IA2, cT1bN0M0, NSCLC, of the LUL  with history of Stage IA, cT1bN0M0, NSCLC in another site of the LUL of the lung   Indication for treatment:  Curative       Radiation treatment dates:   03/11/23, 03/13/23  Site/dose:   The tumor in the LUL was treated with a course of stereotactic body radiation treatment. The patient received 54 Gy In 3 fractions at 18 G per fraction.  Narrative: The patient tolerated radiation treatment relatively well.   The patient did not have any signs of acute toxicity during treatment.  Plan: The patient will receive a call in about one month from the radiation oncology department. He will follow up in 6-8 weeks with another scan.      Joyice Faster, PA-C

## 2023-03-17 ENCOUNTER — Other Ambulatory Visit: Payer: Self-pay

## 2023-03-17 ENCOUNTER — Ambulatory Visit
Admission: RE | Admit: 2023-03-17 | Discharge: 2023-03-17 | Disposition: A | Discharge status: Home / Self Care | Payer: Medicare Other | Source: Ambulatory Visit | Attending: Radiation Oncology | Admitting: Radiation Oncology

## 2023-03-17 DIAGNOSIS — C3412 Malignant neoplasm of upper lobe, left bronchus or lung: Secondary | ICD-10-CM | POA: Diagnosis not present

## 2023-03-17 DIAGNOSIS — Z87891 Personal history of nicotine dependence: Secondary | ICD-10-CM | POA: Diagnosis not present

## 2023-03-17 DIAGNOSIS — Z510 Encounter for antineoplastic radiation therapy: Secondary | ICD-10-CM | POA: Diagnosis not present

## 2023-03-17 LAB — RAD ONC ARIA SESSION SUMMARY
Course Elapsed Days: 6
Plan Fractions Treated to Date: 3
Plan Prescribed Dose Per Fraction: 18 Gy
Plan Total Fractions Prescribed: 3
Plan Total Prescribed Dose: 54 Gy
Reference Point Dosage Given to Date: 54 Gy
Reference Point Session Dosage Given: 18 Gy
Session Number: 3

## 2023-03-18 ENCOUNTER — Ambulatory Visit: Payer: Medicare Other | Admitting: Radiation Oncology

## 2023-03-18 NOTE — Radiation Completion Notes (Addendum)
  Radiation Oncology         [phone] ________________________________  Name: Jason Carter MRN: [id number]  Date of Service: 03/17/2023  DOB: [birth year]  End of Treatment Note  Diagnosis: Putative Stage IA2, cT1bN0M0, NSCLC, of the LUL  with a history as well of Stage IA, cT1bN0M0, NSCLC of the LUL of the lung   Intent: Curative     ==========DELIVERED PLANS==========  First Treatment Date: 2023-03-11 - Last Treatment Date: 2023-03-17   Plan Name: Lung_L_SBRT Site: Lung, Left Technique: SBRT/SRT-IMRT Mode: Photon Dose Per Fraction: 18 Gy Prescribed Dose (Delivered / Prescribed): 54 Gy / 54 Gy Prescribed Fxs (Delivered / Prescribed): 3 / 3     ==========ON TREATMENT VISIT DATES========== 2023-03-11, 2023-03-13, 2023-03-17  See weekly On Treatment Notes in Epic for details. The patient tolerated radiation.   The patient will receive a call in about one month from the radiation oncology department. He will continue follow up in our clinic and a new CT scan in 6-8 weeks will be performed with anticipation of resuming surveillance at 6 month intervals thereafter per NCCN Guidelines.     Osker Mason, PAC

## 2023-03-19 ENCOUNTER — Ambulatory Visit: Payer: Medicare Other | Admitting: Radiation Oncology

## 2023-03-21 ENCOUNTER — Ambulatory Visit: Payer: Medicare Other | Admitting: Radiation Oncology

## 2023-03-31 ENCOUNTER — Telehealth: Payer: Self-pay | Admitting: Radiation Oncology

## 2023-03-31 NOTE — Telephone Encounter (Signed)
ADVISED PT I CX HIS FU W/ ALISON PERKINS PA SCHEDULED FOR 04-28-23 @ 3 PM - ALISON WILL CALL PT ONCE RESULTS ARE READY - PT VOICED UNDERSTANDING

## 2023-04-02 DIAGNOSIS — Z23 Encounter for immunization: Secondary | ICD-10-CM | POA: Diagnosis not present

## 2023-04-03 ENCOUNTER — Other Ambulatory Visit: Payer: Self-pay | Admitting: *Deleted

## 2023-04-03 MED ORDER — METHOTREXATE SODIUM 2.5 MG PO TABS: ORAL_TABLET | ORAL | 0 refills | Status: DC

## 2023-04-03 NOTE — Telephone Encounter (Signed)
Patient contacted the office and requested refill on MTX.  Last Fill: 12/10/2022  Labs: 01/21/2023 CBC and CMP are stable.  Hemoglobin is low and stable.   Next Visit: 07/29/2023  Last Visit: 02/11/2023  DX: Rheumatoid arthritis involving multiple sites with positive rheumatoid factor   Current Dose per office note 02/11/2023: methotrexate 8 tablets p.o. weekly   Okay to refill Methotrexate?

## 2023-04-22 ENCOUNTER — Ambulatory Visit (HOSPITAL_COMMUNITY)
Admission: RE | Admit: 2023-04-22 | Discharge: 2023-04-22 | Disposition: A | Discharge status: Home / Self Care | Payer: Medicare Other | Source: Ambulatory Visit | Attending: Radiation Oncology | Admitting: Radiation Oncology

## 2023-04-22 ENCOUNTER — Encounter (HOSPITAL_COMMUNITY): Payer: Self-pay

## 2023-04-22 DIAGNOSIS — C3412 Malignant neoplasm of upper lobe, left bronchus or lung: Secondary | ICD-10-CM | POA: Insufficient documentation

## 2023-04-22 DIAGNOSIS — C3490 Malignant neoplasm of unspecified part of unspecified bronchus or lung: Secondary | ICD-10-CM | POA: Diagnosis not present

## 2023-04-22 DIAGNOSIS — I700 Atherosclerosis of aorta: Secondary | ICD-10-CM | POA: Diagnosis not present

## 2023-04-22 LAB — POCT I-STAT CREATININE: Creatinine, Ser: 0.7 mg/dL (ref 0.61–1.24)

## 2023-04-22 MED ORDER — IOHEXOL 300 MG/ML  SOLN
75.0000 mL | Freq: Once | INTRAMUSCULAR | Status: AC | PRN
  Administered 2023-04-22: 75 mL via INTRAVENOUS

## 2023-04-22 MED ORDER — SODIUM CHLORIDE (PF) 0.9 % IJ SOLN
INTRAMUSCULAR | Status: AC
  Filled 2023-04-22: qty 50

## 2023-04-28 ENCOUNTER — Ambulatory Visit: Payer: Self-pay | Admitting: Radiation Oncology

## 2023-05-05 ENCOUNTER — Telehealth: Payer: Self-pay | Admitting: Radiation Oncology

## 2023-05-05 DIAGNOSIS — H40013 Open angle with borderline findings, low risk, bilateral: Secondary | ICD-10-CM | POA: Diagnosis not present

## 2023-05-05 DIAGNOSIS — H35372 Puckering of macula, left eye: Secondary | ICD-10-CM | POA: Diagnosis not present

## 2023-05-05 DIAGNOSIS — H04123 Dry eye syndrome of bilateral lacrimal glands: Secondary | ICD-10-CM | POA: Diagnosis not present

## 2023-05-05 DIAGNOSIS — R7309 Other abnormal glucose: Secondary | ICD-10-CM | POA: Diagnosis not present

## 2023-05-05 NOTE — Telephone Encounter (Signed)
I called to review the patient's recent CT chest results which were reassuring. We discussed the recommendation for a repeat scan in 6 months time. He is in agreement with this plan. He also mentioned a concerning lesion along his left temple and ear. He has never had a skin cancer screening exam, or ever been to a dermatologist. He is concerned as the lesion along the left temple has crusting, bleeding, and soreness which are similar symptoms to a friend of his who had skin cancer. He is interested in a referral to dermatology. I will place this order as well.

## 2023-05-06 ENCOUNTER — Telehealth: Payer: Self-pay | Admitting: *Deleted

## 2023-05-06 ENCOUNTER — Other Ambulatory Visit: Payer: Self-pay | Admitting: Radiation Oncology

## 2023-05-06 DIAGNOSIS — C3412 Malignant neoplasm of upper lobe, left bronchus or lung: Secondary | ICD-10-CM

## 2023-05-06 DIAGNOSIS — L989 Disorder of the skin and subcutaneous tissue, unspecified: Secondary | ICD-10-CM

## 2023-05-06 NOTE — Telephone Encounter (Signed)
Called patient to inform of appt. with Dr. Terri Piedra on 06-12-23- arrival time 9:40 am, spoke with patient and he is aware of this appt.

## 2023-05-07 ENCOUNTER — Ambulatory Visit: Payer: Medicare Other

## 2023-05-07 DIAGNOSIS — Z0000 Encounter for general adult medical examination without abnormal findings: Secondary | ICD-10-CM | POA: Diagnosis not present
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
## 2023-05-07 NOTE — Patient Instructions (Signed)
Mr. Hazelrigg , Thank you for taking time to come for your Medicare Wellness Visit. I appreciate your ongoing commitment to your health goals. Please review the following plan we discussed and let me know if I can assist you in the future.   Referrals/Orders/Follow-Ups/Clinician Recommendations: none  This is a list of the screening recommended for you and due dates:  Health Maintenance  Topic Date Due   COVID-19 Vaccine (7 - 2023-24 season) 05/28/2023   Medicare Annual Wellness Visit  05/06/2024   DTaP/Tdap/Td vaccine (2 - Tdap) 02/12/2027   Pneumonia Vaccine  Completed   Flu Shot  Completed   Hepatitis C Screening  Completed   Zoster (Shingles) Vaccine  Completed   HPV Vaccine  Aged Out    Advanced directives: (Copy Requested) Please bring a copy of your health care power of attorney and living will to the office to be added to your chart at your convenience.  Next Medicare Annual Wellness Visit scheduled for next year: No, will schedule next year  insert Preventive Care attachment Insert FALL PREVENTION attachment if needed

## 2023-05-08 NOTE — Progress Notes (Signed)
I reviewed the AWV findings with the provider who conducted the visit. I was present in the office suite and immediately available to provide assistance and direction throughout the time the service was provided.  

## 2023-05-09 NOTE — Progress Notes (Signed)
Internal Medicine Clinic Attending  Case and documentation reviewed.  I reviewed the AWV findings.  I agree with the assessment, diagnosis, and plan of care documented in the AWV note.     

## 2023-05-19 ENCOUNTER — Ambulatory Visit
Admission: RE | Admit: 2023-05-19 | Discharge: 2023-05-19 | Disposition: A | Discharge status: Home / Self Care | Payer: Medicare Other | Source: Ambulatory Visit | Attending: Radiation Oncology | Admitting: Radiation Oncology

## 2023-05-19 DIAGNOSIS — Z87891 Personal history of nicotine dependence: Secondary | ICD-10-CM | POA: Insufficient documentation

## 2023-05-19 DIAGNOSIS — Z510 Encounter for antineoplastic radiation therapy: Secondary | ICD-10-CM | POA: Insufficient documentation

## 2023-05-19 DIAGNOSIS — C3412 Malignant neoplasm of upper lobe, left bronchus or lung: Secondary | ICD-10-CM | POA: Insufficient documentation

## 2023-05-19 NOTE — Progress Notes (Signed)
Radiation Oncology         [phone] ________________________________  Name: Jason Carter MRN: [id number]  Date of Service: 05/19/2023  DOB: March 03, 1946  Post Treatment Telephone Note  Diagnosis:  Putative Stage IA2, cT1bN0M0, NSCLC, of the LUL  with a history as well of Stage IA, cT1bN0M0, NSCLC of the LUL of the lung (as documented in provider EOT note)  The patient was not available for call today. Voicemail left.  The patient will continue to follow up w/ his Radiologist Dr. Mitzi Hansen in the Rad/Onc dept for ongoing care, and was encouraged to call if he develops concerns or questions regarding radiation.   Ruel Favors, LPN

## 2023-06-12 DIAGNOSIS — C44319 Basal cell carcinoma of skin of other parts of face: Secondary | ICD-10-CM | POA: Diagnosis not present

## 2023-06-12 DIAGNOSIS — L821 Other seborrheic keratosis: Secondary | ICD-10-CM | POA: Diagnosis not present

## 2023-06-12 DIAGNOSIS — D492 Neoplasm of unspecified behavior of bone, soft tissue, and skin: Secondary | ICD-10-CM | POA: Diagnosis not present

## 2023-06-12 DIAGNOSIS — L570 Actinic keratosis: Secondary | ICD-10-CM | POA: Diagnosis not present

## 2023-06-18 ENCOUNTER — Other Ambulatory Visit: Payer: Self-pay | Admitting: *Deleted

## 2023-06-18 ENCOUNTER — Other Ambulatory Visit: Payer: Self-pay | Admitting: Rheumatology

## 2023-06-18 DIAGNOSIS — Z79899 Other long term (current) drug therapy: Secondary | ICD-10-CM

## 2023-06-18 NOTE — Telephone Encounter (Signed)
Patient states he will come in to try to get lab work today, if not today then Monday.

## 2023-06-18 NOTE — Telephone Encounter (Signed)
Last Fill: 04/03/2023  Labs: 01/21/2023 CBC and CMP are stable.  Hemoglobin is low and stable.   Next Visit: 07/29/2023  Last Visit: 02/11/2023  DX: Rheumatoid arthritis involving multiple sites with positive rheumatoid factor   Current Dose per office note 02/11/2023:  methotrexate 8 tablets p.o. weekly   Left message to advise patient he is due to update his lab work.   Okay to refill Methotrexate?

## 2023-06-19 LAB — COMPLETE METABOLIC PANEL WITH GFR
AG Ratio: 1.5 (calc) (ref 1.0–2.5)
ALT: 17 U/L (ref 9–46)
AST: 22 U/L (ref 10–35)
Albumin: 4.1 g/dL (ref 3.6–5.1)
Alkaline phosphatase (APISO): 64 U/L (ref 35–144)
BUN: 19 mg/dL (ref 7–25)
CO2: 32 mmol/L (ref 20–32)
Calcium: 9.7 mg/dL (ref 8.6–10.3)
Chloride: 100 mmol/L (ref 98–110)
Creat: 0.71 mg/dL (ref 0.70–1.28)
Globulin: 2.7 g/dL (ref 1.9–3.7)
Glucose, Bld: 78 mg/dL (ref 65–99)
Potassium: 5.5 mmol/L — ABNORMAL HIGH (ref 3.5–5.3)
Sodium: 140 mmol/L (ref 135–146)
Total Bilirubin: 0.4 mg/dL (ref 0.2–1.2)
Total Protein: 6.8 g/dL (ref 6.1–8.1)
eGFR: 94 mL/min/{1.73_m2} (ref >=60)

## 2023-06-19 LAB — CBC WITH DIFFERENTIAL/PLATELET
Absolute Lymphocytes: 973 {cells}/uL (ref 850–3900)
Absolute Monocytes: 669 {cells}/uL (ref 200–950)
Basophils Absolute: 28 {cells}/uL (ref 0–200)
Basophils Relative: 0.4 %
Eosinophils Absolute: 593 {cells}/uL — ABNORMAL HIGH (ref 15–500)
Eosinophils Relative: 8.6 %
HCT: 34.6 % — ABNORMAL LOW (ref 38.5–50.0)
Hemoglobin: 11.1 g/dL — ABNORMAL LOW (ref 13.2–17.1)
MCH: 29.1 pg (ref 27.0–33.0)
MCHC: 32.1 g/dL (ref 32.0–36.0)
MCV: 90.6 fL (ref 80.0–100.0)
MPV: 11.7 fL (ref 7.5–12.5)
Monocytes Relative: 9.7 %
Neutro Abs: 4637 {cells}/uL (ref 1500–7800)
Neutrophils Relative %: 67.2 %
Platelets: 197 10*3/uL (ref 140–400)
RBC: 3.82 10*6/uL — ABNORMAL LOW (ref 4.20–5.80)
RDW: 14.2 % (ref 11.0–15.0)
Total Lymphocyte: 14.1 %
WBC: 6.9 10*3/uL (ref 3.8–10.8)

## 2023-06-23 NOTE — Progress Notes (Signed)
Hemoglobin is low.  Patient should see his PCP regarding evaluation of anemia.  Potassium is elevated.  (Most likely a hemolyzed sample.) please notify patient and forward results to his PCP.

## 2023-07-07 DIAGNOSIS — C44319 Basal cell carcinoma of skin of other parts of face: Secondary | ICD-10-CM | POA: Diagnosis not present

## 2023-07-08 DIAGNOSIS — D2372 Other benign neoplasm of skin of left lower limb, including hip: Secondary | ICD-10-CM | POA: Diagnosis not present

## 2023-07-08 DIAGNOSIS — Z08 Encounter for follow-up examination after completed treatment for malignant neoplasm: Secondary | ICD-10-CM | POA: Diagnosis not present

## 2023-07-08 DIAGNOSIS — Z85828 Personal history of other malignant neoplasm of skin: Secondary | ICD-10-CM | POA: Diagnosis not present

## 2023-07-08 DIAGNOSIS — Z7189 Other specified counseling: Secondary | ICD-10-CM | POA: Diagnosis not present

## 2023-07-08 DIAGNOSIS — L821 Other seborrheic keratosis: Secondary | ICD-10-CM | POA: Diagnosis not present

## 2023-07-08 DIAGNOSIS — D225 Melanocytic nevi of trunk: Secondary | ICD-10-CM | POA: Diagnosis not present

## 2023-07-08 DIAGNOSIS — L814 Other melanin hyperpigmentation: Secondary | ICD-10-CM | POA: Diagnosis not present

## 2023-07-08 DIAGNOSIS — D1801 Hemangioma of skin and subcutaneous tissue: Secondary | ICD-10-CM | POA: Diagnosis not present

## 2023-07-11 ENCOUNTER — Other Ambulatory Visit: Payer: Self-pay | Admitting: Physician Assistant

## 2023-07-11 NOTE — Telephone Encounter (Signed)
Last Fill: 06/18/2023  Labs: 06/18/2023 Hemoglobin is low. Potassium is elevated.     Next Visit: 07/29/2023  Last Visit: 02/11/2023  DX: Rheumatoid arthritis involving multiple sites with positive rheumatoid factor   Current Dose per office note 02/11/2023: methotrexate 8 tablets p.o. weekly   Okay to refill Methotrexate?

## 2023-07-16 DIAGNOSIS — M0579 Rheumatoid arthritis with rheumatoid factor of multiple sites without organ or systems involvement: Secondary | ICD-10-CM | POA: Insufficient documentation
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
## 2023-07-29 ENCOUNTER — Ambulatory Visit: Payer: Medicare Other | Attending: Rheumatology | Admitting: Rheumatology

## 2023-07-29 ENCOUNTER — Encounter: Payer: Self-pay | Admitting: Rheumatology

## 2023-07-29 VITALS — BP 116/70 | HR 87 | Resp 16 | Ht 66.0 in | Wt 142.0 lb

## 2023-07-29 DIAGNOSIS — Z8669 Personal history of other diseases of the nervous system and sense organs: Secondary | ICD-10-CM | POA: Diagnosis not present

## 2023-07-29 DIAGNOSIS — M79672 Pain in left foot: Secondary | ICD-10-CM | POA: Insufficient documentation

## 2023-07-29 DIAGNOSIS — M79641 Pain in right hand: Secondary | ICD-10-CM | POA: Diagnosis not present

## 2023-07-29 DIAGNOSIS — C3412 Malignant neoplasm of upper lobe, left bronchus or lung: Secondary | ICD-10-CM | POA: Diagnosis not present

## 2023-07-29 DIAGNOSIS — I700 Atherosclerosis of aorta: Secondary | ICD-10-CM

## 2023-07-29 DIAGNOSIS — G5612 Other lesions of median nerve, left upper limb: Secondary | ICD-10-CM

## 2023-07-29 DIAGNOSIS — M79671 Pain in right foot: Secondary | ICD-10-CM | POA: Diagnosis present

## 2023-07-29 DIAGNOSIS — Z8679 Personal history of other diseases of the circulatory system: Secondary | ICD-10-CM

## 2023-07-29 DIAGNOSIS — Z87891 Personal history of nicotine dependence: Secondary | ICD-10-CM

## 2023-07-29 DIAGNOSIS — M0579 Rheumatoid arthritis with rheumatoid factor of multiple sites without organ or systems involvement: Secondary | ICD-10-CM

## 2023-07-29 DIAGNOSIS — M8589 Other specified disorders of bone density and structure, multiple sites: Secondary | ICD-10-CM | POA: Diagnosis not present

## 2023-07-29 DIAGNOSIS — I10 Essential (primary) hypertension: Secondary | ICD-10-CM | POA: Diagnosis present

## 2023-07-29 DIAGNOSIS — Z79899 Other long term (current) drug therapy: Secondary | ICD-10-CM

## 2023-07-29 DIAGNOSIS — Z862 Personal history of diseases of the blood and blood-forming organs and certain disorders involving the immune mechanism: Secondary | ICD-10-CM

## 2023-07-29 DIAGNOSIS — M79642 Pain in left hand: Secondary | ICD-10-CM | POA: Insufficient documentation

## 2023-07-29 DIAGNOSIS — Z8709 Personal history of other diseases of the respiratory system: Secondary | ICD-10-CM | POA: Diagnosis not present

## 2023-07-29 DIAGNOSIS — Z8739 Personal history of other diseases of the musculoskeletal system and connective tissue: Secondary | ICD-10-CM

## 2023-07-29 NOTE — Patient Instructions (Signed)
  Standing Labs We placed an order today for your standing lab work.   Please have your standing labs drawn in February and every 3 months  Please have your labs drawn 2 weeks prior to your appointment so that the provider can discuss your lab results at your appointment, if possible.  Please note that you may see your imaging and lab results in MyChart before we have reviewed them. We will contact you once all results are reviewed. Please allow our office up to 72 hours to thoroughly review all of the results before contacting the office for clarification of your results.  WALK-IN LAB HOURS  Monday through Thursday from 8:00 am -12:30 pm and 1:00 pm-5:00 pm and Friday from 8:00 am-12:00 pm.  Patients with office visits requiring labs will be seen before walk-in labs.  You may encounter longer than normal wait times. Please allow additional time. Wait times may be shorter on  Monday and Thursday afternoons.  We do not book appointments for walk-in labs. We appreciate your patience and understanding with our staff.   Labs are drawn by [facility]. Please bring your co-pay at the time of your lab draw.  You may receive a bill from [facility] for your lab work.  Please note if you are on Hydroxychloroquine and and an order has been placed for a Hydroxychloroquine level,  you will need to have it drawn 4 hours or more after your last dose.  If you wish to have your labs drawn at another location, please call the office 24 hours in advance so we can fax the orders.  The office is located at [address], [place], [place] [address]   If you have any questions regarding directions or hours of operation,  please call [phone].   As a reminder, please drink plenty of water prior to coming for your lab work. Thanks!   Vaccines You are taking a medication(s) that can suppress your immune system.  The following immunizations are recommended: Flu annually Covid-19  RSV Td/Tdap (tetanus,  diphtheria, pertussis) every 10 years Pneumonia (Prevnar 15 then Pneumovax 23 at least 1 year apart.  Alternatively, can take Prevnar 20 without needing additional dose) Shingrix: 2 doses from 4 weeks to 6 months apart  Please check with your PCP to make sure you are up to date.   If you have signs or symptoms of an infection or start antibiotics: First, call your PCP for workup of your infection. Hold your medication through the infection, until you complete your antibiotics, and until symptoms resolve if you take the following: Injectable medication (Actemra, Benlysta, Cimzia, Cosentyx, Enbrel, Humira, Kevzara, Orencia, Remicade, Simponi, Stelara, Taltz, Tremfya) Methotrexate Leflunomide (Arava) Mycophenolate (Cellcept) Harriette Ohara, Olumiant, or Rinvoq

## 2023-10-04 ENCOUNTER — Other Ambulatory Visit: Payer: Self-pay | Admitting: Rheumatology

## 2023-10-06 ENCOUNTER — Other Ambulatory Visit: Payer: Self-pay | Admitting: *Deleted

## 2023-10-06 ENCOUNTER — Other Ambulatory Visit: Payer: Self-pay

## 2023-10-06 DIAGNOSIS — R768 Other specified abnormal immunological findings in serum: Secondary | ICD-10-CM | POA: Diagnosis not present

## 2023-10-06 DIAGNOSIS — M0579 Rheumatoid arthritis with rheumatoid factor of multiple sites without organ or systems involvement: Secondary | ICD-10-CM

## 2023-10-06 DIAGNOSIS — Z79899 Other long term (current) drug therapy: Secondary | ICD-10-CM

## 2023-10-06 DIAGNOSIS — M1990 Unspecified osteoarthritis, unspecified site: Secondary | ICD-10-CM

## 2023-10-06 NOTE — Telephone Encounter (Signed)
 Please clarify with the patient will be having updated lab work-Labs are due at the end of February 2025

## 2023-10-06 NOTE — Telephone Encounter (Signed)
 Last Fill: 07/11/2023  Labs: 06/18/2023 Hemoglobin is low.  Patient should see his PCP regarding evaluation of anemia.  Potassium is elevated.  (Most likely a hemolyzed sample.) please notify patient and forward results to his PCP.   Next Visit: 12/29/2023  Last Visit: 07/29/2023  DX: Rheumatoid arthritis involving multiple sites with positive rheumatoid factor   Current Dose per office note 07/29/2023: methotrexate 8 tablets p.o. weekly   Okay to refill Methotrexate?

## 2023-10-06 NOTE — Telephone Encounter (Signed)
 I called patient, patient will have labs drawn 10/07/2023.

## 2023-10-07 LAB — CBC WITH DIFFERENTIAL/PLATELET
Absolute Lymphocytes: 890 {cells}/uL (ref 850–3900)
Absolute Monocytes: 749 {cells}/uL (ref 200–950)
Basophils Absolute: 19 {cells}/uL (ref 0–200)
Basophils Relative: 0.3 %
Eosinophils Absolute: 467 {cells}/uL (ref 15–500)
Eosinophils Relative: 7.3 %
HCT: 32.7 % — ABNORMAL LOW (ref 38.5–50.0)
Hemoglobin: 10.5 g/dL — ABNORMAL LOW (ref 13.2–17.1)
MCH: 29.7 pg (ref 27.0–33.0)
MCHC: 32.1 g/dL (ref 32.0–36.0)
MCV: 92.4 fL (ref 80.0–100.0)
MPV: 10.7 fL (ref 7.5–12.5)
Monocytes Relative: 11.7 %
Neutro Abs: 4275 {cells}/uL (ref 1500–7800)
Neutrophils Relative %: 66.8 %
Platelets: 175 10*3/uL (ref 140–400)
RBC: 3.54 10*6/uL — ABNORMAL LOW (ref 4.20–5.80)
RDW: 13.6 % (ref 11.0–15.0)
Total Lymphocyte: 13.9 %
WBC: 6.4 10*3/uL (ref 3.8–10.8)

## 2023-10-07 LAB — COMPLETE METABOLIC PANEL WITH GFR
AG Ratio: 1.4 (calc) (ref 1.0–2.5)
ALT: 14 U/L (ref 9–46)
AST: 19 U/L (ref 10–35)
Albumin: 4 g/dL (ref 3.6–5.1)
Alkaline phosphatase (APISO): 57 U/L (ref 35–144)
BUN: 17 mg/dL (ref 7–25)
CO2: 30 mmol/L (ref 20–32)
Calcium: 9.1 mg/dL (ref 8.6–10.3)
Chloride: 103 mmol/L (ref 98–110)
Creat: 0.8 mg/dL (ref 0.70–1.28)
Globulin: 2.8 g/dL (ref 1.9–3.7)
Glucose, Bld: 71 mg/dL (ref 65–99)
Potassium: 4.5 mmol/L (ref 3.5–5.3)
Sodium: 142 mmol/L (ref 135–146)
Total Bilirubin: 0.4 mg/dL (ref 0.2–1.2)
Total Protein: 6.8 g/dL (ref 6.1–8.1)
eGFR: 91 mL/min/{1.73_m2} (ref >=60)

## 2023-10-07 NOTE — Progress Notes (Signed)
Hemoglobin is low and stable.  Patient should take multivitamin with iron.  CMP is normal.

## 2023-10-20 ENCOUNTER — Other Ambulatory Visit: Payer: Self-pay | Admitting: Rheumatology

## 2023-10-20 DIAGNOSIS — M05731 Rheumatoid arthritis with rheumatoid factor of right wrist without organ or systems involvement: Secondary | ICD-10-CM

## 2023-10-20 NOTE — Telephone Encounter (Signed)
 Last Fill: 10/28/2022  Next Visit: 12/29/2023  Last Visit: 07/29/2023  Dx: Rheumatoid arthritis involving multiple sites with positive rheumatoid factor   Current Dose per office note on 07/29/2023: folic acid 2 mg p.o. daily.   Okay to refill Folic Acid?

## 2023-10-21 ENCOUNTER — Telehealth: Payer: Self-pay | Admitting: *Deleted

## 2023-10-21 NOTE — Telephone Encounter (Signed)
 Called patient to inform of Ct for 11-04-23- arrival time- 2:45 pm @ [facility], no restrictions to scan, patient to be called by Laurence Aly on 11-18-23 @ 8:30 am with results , spoke with patient and he is aware of these appts. and the instructions

## 2023-11-04 ENCOUNTER — Ambulatory Visit (HOSPITAL_COMMUNITY)
Admission: RE | Admit: 2023-11-04 | Discharge: 2023-11-04 | Disposition: A | Discharge status: Home / Self Care | Source: Ambulatory Visit | Attending: Radiation Oncology | Admitting: Radiation Oncology

## 2023-11-04 DIAGNOSIS — I700 Atherosclerosis of aorta: Secondary | ICD-10-CM | POA: Diagnosis not present

## 2023-11-04 DIAGNOSIS — J432 Centrilobular emphysema: Secondary | ICD-10-CM | POA: Diagnosis not present

## 2023-11-04 DIAGNOSIS — C3412 Malignant neoplasm of upper lobe, left bronchus or lung: Secondary | ICD-10-CM | POA: Diagnosis not present

## 2023-11-04 DIAGNOSIS — C3492 Malignant neoplasm of unspecified part of left bronchus or lung: Secondary | ICD-10-CM | POA: Diagnosis not present

## 2023-11-04 LAB — POCT I-STAT CREATININE: Creatinine, Ser: 0.8 mg/dL (ref 0.61–1.24)

## 2023-11-04 MED ORDER — IOHEXOL 300 MG/ML  SOLN
75.0000 mL | Freq: Once | INTRAMUSCULAR | Status: AC | PRN
  Administered 2023-11-04: 75 mL via INTRAVENOUS

## 2023-11-05 DIAGNOSIS — H40013 Open angle with borderline findings, low risk, bilateral: Secondary | ICD-10-CM | POA: Diagnosis not present

## 2023-11-05 DIAGNOSIS — H04123 Dry eye syndrome of bilateral lacrimal glands: Secondary | ICD-10-CM | POA: Diagnosis not present

## 2023-11-10 ENCOUNTER — Ambulatory Visit: Payer: Medicare Other | Admitting: Radiation Oncology

## 2023-11-17 ENCOUNTER — Telehealth: Payer: Self-pay | Admitting: *Deleted

## 2023-11-17 NOTE — Telephone Encounter (Signed)
 Called patient to inform that his scan results are not in yet,. and they may not be ready tomorrow either, if they are ready Jason Carter will call, if not she will call after the results are in, lvm for a return call

## 2023-11-18 ENCOUNTER — Ambulatory Visit
Admission: RE | Admit: 2023-11-18 | Discharge: 2023-11-18 | Disposition: A | Discharge status: Home / Self Care | Source: Ambulatory Visit | Attending: Radiation Oncology | Admitting: Radiation Oncology

## 2023-11-18 NOTE — Progress Notes (Signed)
 Voicemail's left for pt. in reference to nursing portion for his 11/18/23 apt.    Avery Bodo, LPN

## 2023-11-25 ENCOUNTER — Telehealth: Payer: Self-pay | Admitting: Radiation Oncology

## 2023-11-26 ENCOUNTER — Telehealth: Payer: Self-pay | Admitting: Radiation Oncology

## 2023-11-26 DIAGNOSIS — C3412 Malignant neoplasm of upper lobe, left bronchus or lung: Secondary | ICD-10-CM

## 2023-11-26 NOTE — Telephone Encounter (Signed)
 I called the patient and had to leave a voicemail to go over his recent CT scan, unfortunately it took several weeks for his report to be available, but the area where he previously had radiation for early stage lung cancer remained stable and a new nodule in the right upper lobe was also noted, so I recommended that he have a repeat scan in approximately 3 to 4 months I encouraged him to call back if he would like to discuss his results in more detail, and placed orders.

## 2023-11-26 NOTE — Telephone Encounter (Signed)
 error
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
## 2023-12-29 ENCOUNTER — Encounter: Payer: Self-pay | Admitting: Physician Assistant

## 2023-12-29 ENCOUNTER — Ambulatory Visit: Payer: Medicare Other | Attending: Physician Assistant | Admitting: Physician Assistant

## 2023-12-29 VITALS — BP 106/62 | HR 89 | Resp 16 | Ht 66.0 in | Wt 143.0 lb

## 2023-12-29 DIAGNOSIS — M79642 Pain in left hand: Secondary | ICD-10-CM | POA: Diagnosis not present

## 2023-12-29 DIAGNOSIS — Z79899 Other long term (current) drug therapy: Secondary | ICD-10-CM | POA: Diagnosis not present

## 2023-12-29 DIAGNOSIS — Z87891 Personal history of nicotine dependence: Secondary | ICD-10-CM | POA: Insufficient documentation

## 2023-12-29 DIAGNOSIS — I10 Essential (primary) hypertension: Secondary | ICD-10-CM | POA: Insufficient documentation

## 2023-12-29 DIAGNOSIS — M79641 Pain in right hand: Secondary | ICD-10-CM | POA: Diagnosis not present

## 2023-12-29 DIAGNOSIS — M79672 Pain in left foot: Secondary | ICD-10-CM | POA: Diagnosis not present

## 2023-12-29 DIAGNOSIS — Z8679 Personal history of other diseases of the circulatory system: Secondary | ICD-10-CM | POA: Diagnosis not present

## 2023-12-29 DIAGNOSIS — G5612 Other lesions of median nerve, left upper limb: Secondary | ICD-10-CM | POA: Insufficient documentation

## 2023-12-29 DIAGNOSIS — R768 Other specified abnormal immunological findings in serum: Secondary | ICD-10-CM | POA: Diagnosis not present

## 2023-12-29 DIAGNOSIS — M8589 Other specified disorders of bone density and structure, multiple sites: Secondary | ICD-10-CM | POA: Diagnosis not present

## 2023-12-29 DIAGNOSIS — M79671 Pain in right foot: Secondary | ICD-10-CM | POA: Insufficient documentation

## 2023-12-29 DIAGNOSIS — Z8669 Personal history of other diseases of the nervous system and sense organs: Secondary | ICD-10-CM | POA: Diagnosis not present

## 2023-12-29 DIAGNOSIS — I700 Atherosclerosis of aorta: Secondary | ICD-10-CM | POA: Diagnosis not present

## 2023-12-29 DIAGNOSIS — C3412 Malignant neoplasm of upper lobe, left bronchus or lung: Secondary | ICD-10-CM | POA: Insufficient documentation

## 2023-12-29 DIAGNOSIS — Z8739 Personal history of other diseases of the musculoskeletal system and connective tissue: Secondary | ICD-10-CM | POA: Insufficient documentation

## 2023-12-29 DIAGNOSIS — Z862 Personal history of diseases of the blood and blood-forming organs and certain disorders involving the immune mechanism: Secondary | ICD-10-CM | POA: Diagnosis not present

## 2023-12-29 DIAGNOSIS — M1990 Unspecified osteoarthritis, unspecified site: Secondary | ICD-10-CM

## 2023-12-29 DIAGNOSIS — Z8709 Personal history of other diseases of the respiratory system: Secondary | ICD-10-CM | POA: Insufficient documentation

## 2023-12-29 DIAGNOSIS — Z13220 Encounter for screening for lipoid disorders: Secondary | ICD-10-CM | POA: Diagnosis not present

## 2023-12-29 DIAGNOSIS — M0579 Rheumatoid arthritis with rheumatoid factor of multiple sites without organ or systems involvement: Secondary | ICD-10-CM | POA: Diagnosis not present

## 2023-12-29 MED ORDER — METHOTREXATE SODIUM 2.5 MG PO TABS: ORAL_TABLET | ORAL | 0 refills | Status: DC

## 2023-12-30 ENCOUNTER — Ambulatory Visit: Payer: Self-pay | Admitting: Physician Assistant

## 2023-12-30 LAB — CBC WITH DIFFERENTIAL/PLATELET
Absolute Lymphocytes: 799 {cells}/uL — ABNORMAL LOW (ref 850–3900)
Absolute Monocytes: 746 {cells}/uL (ref 200–950)
Basophils Absolute: 33 {cells}/uL (ref 0–200)
Basophils Relative: 0.5 %
Eosinophils Absolute: 495 {cells}/uL (ref 15–500)
Eosinophils Relative: 7.5 %
HCT: 34 % — ABNORMAL LOW (ref 38.5–50.0)
Hemoglobin: 10.6 g/dL — ABNORMAL LOW (ref 13.2–17.1)
MCH: 29.1 pg (ref 27.0–33.0)
MCHC: 31.2 g/dL — ABNORMAL LOW (ref 32.0–36.0)
MCV: 93.4 fL (ref 80.0–100.0)
MPV: 11.4 fL (ref 7.5–12.5)
Monocytes Relative: 11.3 %
Neutro Abs: 4528 {cells}/uL (ref 1500–7800)
Neutrophils Relative %: 68.6 %
Platelets: 182 10*3/uL (ref 140–400)
RBC: 3.64 10*6/uL — ABNORMAL LOW (ref 4.20–5.80)
RDW: 13.4 % (ref 11.0–15.0)
Total Lymphocyte: 12.1 %
WBC: 6.6 10*3/uL (ref 3.8–10.8)

## 2023-12-30 LAB — COMPREHENSIVE METABOLIC PANEL WITH GFR
AG Ratio: 1.4 (calc) (ref 1.0–2.5)
ALT: 14 U/L (ref 9–46)
AST: 16 U/L (ref 10–35)
Albumin: 4 g/dL (ref 3.6–5.1)
Alkaline phosphatase (APISO): 56 U/L (ref 35–144)
BUN/Creatinine Ratio: 20 (calc) (ref 6–22)
BUN: 12 mg/dL (ref 7–25)
CO2: 33 mmol/L — ABNORMAL HIGH (ref 20–32)
Calcium: 9.1 mg/dL (ref 8.6–10.3)
Chloride: 101 mmol/L (ref 98–110)
Creat: 0.61 mg/dL — ABNORMAL LOW (ref 0.70–1.28)
Globulin: 2.8 g/dL (ref 1.9–3.7)
Glucose, Bld: 73 mg/dL (ref 65–99)
Potassium: 5 mmol/L (ref 3.5–5.3)
Sodium: 141 mmol/L (ref 135–146)
Total Bilirubin: 0.4 mg/dL (ref 0.2–1.2)
Total Protein: 6.8 g/dL (ref 6.1–8.1)
eGFR: 98 mL/min/{1.73_m2} (ref >=60)

## 2023-12-30 LAB — LIPID PANEL
Cholesterol: 138 mg/dL (ref <200)
HDL: 60 mg/dL (ref >=40)
LDL Cholesterol (Calc): 65 mg/dL
Non-HDL Cholesterol (Calc): 78 mg/dL (ref <130)
Total CHOL/HDL Ratio: 2.3 (calc) (ref <5.0)
Triglycerides: 54 mg/dL (ref <150)

## 2023-12-30 LAB — HEMOGLOBIN A1C
Hgb A1c MFr Bld: 6 % — ABNORMAL HIGH (ref <5.7)
Mean Plasma Glucose: 126 mg/dL
eAG (mmol/L): 7 mmol/L

## 2023-12-30 NOTE — Progress Notes (Signed)
 Patient remains anemic--improved slightly since starting to take iron.   Creatinine is slightly low. Rest of CMP WNL.  Lipid panel Wnl Hgb A1C is 6.0%--please notify the patient and forward results to PCP as requested.

## 2024-01-08 ENCOUNTER — Encounter: Payer: Self-pay | Admitting: *Deleted

## 2024-01-21 DIAGNOSIS — D225 Melanocytic nevi of trunk: Secondary | ICD-10-CM | POA: Diagnosis not present

## 2024-01-21 DIAGNOSIS — L84 Corns and callosities: Secondary | ICD-10-CM | POA: Diagnosis not present

## 2024-01-21 DIAGNOSIS — Z85828 Personal history of other malignant neoplasm of skin: Secondary | ICD-10-CM | POA: Diagnosis not present

## 2024-01-21 DIAGNOSIS — L538 Other specified erythematous conditions: Secondary | ICD-10-CM | POA: Diagnosis not present

## 2024-01-21 DIAGNOSIS — L821 Other seborrheic keratosis: Secondary | ICD-10-CM | POA: Diagnosis not present

## 2024-01-21 DIAGNOSIS — R52 Pain, unspecified: Secondary | ICD-10-CM | POA: Diagnosis not present

## 2024-01-21 DIAGNOSIS — Z08 Encounter for follow-up examination after completed treatment for malignant neoplasm: Secondary | ICD-10-CM | POA: Diagnosis not present

## 2024-01-21 DIAGNOSIS — L570 Actinic keratosis: Secondary | ICD-10-CM | POA: Diagnosis not present

## 2024-01-21 DIAGNOSIS — L308 Other specified dermatitis: Secondary | ICD-10-CM | POA: Diagnosis not present

## 2024-01-21 DIAGNOSIS — L814 Other melanin hyperpigmentation: Secondary | ICD-10-CM | POA: Diagnosis not present

## 2024-02-11 ENCOUNTER — Telehealth: Payer: Self-pay | Admitting: *Deleted

## 2024-02-11 NOTE — Telephone Encounter (Signed)
 CALLED PATIENT TO INFORM OF CT FOR 02-26-24- ARRIVAL TIME- 12:15 PM @ [facility], NO RESTRICTIONS TO SCAN, PATIENT TO RECEIVE RESULTS FROM ALISON PERKINS ON 03/08/24 @ 2 PM VIA TELEPHONE, SPOKE WITH PATIENT AND HE IS AWARE OF THESE APPTS. AND THE INSTRUCTIONS

## 2024-02-26 ENCOUNTER — Ambulatory Visit (HOSPITAL_COMMUNITY)
Admission: RE | Admit: 2024-02-26 | Discharge: 2024-02-26 | Disposition: A | Discharge status: Home / Self Care | Source: Ambulatory Visit | Attending: Radiation Oncology | Admitting: Radiation Oncology

## 2024-02-26 DIAGNOSIS — J432 Centrilobular emphysema: Secondary | ICD-10-CM | POA: Diagnosis not present

## 2024-02-26 DIAGNOSIS — C3412 Malignant neoplasm of upper lobe, left bronchus or lung: Secondary | ICD-10-CM | POA: Insufficient documentation

## 2024-02-26 DIAGNOSIS — I700 Atherosclerosis of aorta: Secondary | ICD-10-CM | POA: Diagnosis not present

## 2024-02-26 DIAGNOSIS — C3490 Malignant neoplasm of unspecified part of unspecified bronchus or lung: Secondary | ICD-10-CM | POA: Diagnosis not present

## 2024-02-26 LAB — POCT I-STAT CREATININE: Creatinine, Ser: 0.7 mg/dL (ref 0.61–1.24)

## 2024-02-26 MED ORDER — IOHEXOL 300 MG/ML  SOLN
100.0000 mL | Freq: Once | INTRAMUSCULAR | Status: AC | PRN
  Administered 2024-02-26: 100 mL via INTRAVENOUS

## 2024-03-08 ENCOUNTER — Telehealth: Payer: Self-pay | Admitting: Radiation Oncology

## 2024-03-08 ENCOUNTER — Ambulatory Visit
Admission: RE | Admit: 2024-03-08 | Discharge: 2024-03-08 | Disposition: A | Discharge status: Home / Self Care | Source: Ambulatory Visit | Attending: Radiation Oncology | Admitting: Radiation Oncology

## 2024-03-08 DIAGNOSIS — C3412 Malignant neoplasm of upper lobe, left bronchus or lung: Secondary | ICD-10-CM | POA: Diagnosis not present

## 2024-03-08 DIAGNOSIS — Z87891 Personal history of nicotine dependence: Secondary | ICD-10-CM | POA: Diagnosis not present
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
## 2024-03-08 NOTE — Telephone Encounter (Signed)
 Called [facility] @ [phone] to advise of referral from Dr. Dewey to have pt established for colon cancer care. Secretary advised referral was entered correctly and information can be seen to have pt established. They will call pt to schedule ASAP.

## 2024-03-10 ENCOUNTER — Telehealth: Payer: Self-pay | Admitting: *Deleted

## 2024-03-10 NOTE — Telephone Encounter (Signed)
 Called patient to inform of appt. With Dr. Norrine on 06-07-24- arrival time- 2 pm - address- [address] patient to bring driver's license or valid id, insurance card and co-pay, spoke with patient and he is aware of this appt. and the instructions

## 2024-03-11 ENCOUNTER — Encounter (HOSPITAL_COMMUNITY)
Admission: RE | Admit: 2024-03-11 | Discharge: 2024-03-11 | Disposition: A | Discharge status: Home / Self Care | Source: Ambulatory Visit | Attending: Radiation Oncology | Admitting: Radiation Oncology

## 2024-03-11 DIAGNOSIS — C3412 Malignant neoplasm of upper lobe, left bronchus or lung: Secondary | ICD-10-CM | POA: Diagnosis not present

## 2024-03-11 LAB — GLUCOSE, CAPILLARY: Glucose-Capillary: 88 mg/dL (ref 70–99)

## 2024-03-11 MED ORDER — FLUDEOXYGLUCOSE F - 18 (FDG) INJECTION: 7.2000 | Freq: Once | INTRAVENOUS | Status: DC | PRN

## 2024-03-12 ENCOUNTER — Ambulatory Visit: Admitting: Gastroenterology

## 2024-03-15 ENCOUNTER — Ambulatory Visit
Admission: EM | Admit: 2024-03-15 | Discharge: 2024-03-15 | Disposition: A | Discharge status: Home / Self Care | Attending: Internal Medicine | Admitting: Internal Medicine

## 2024-03-15 ENCOUNTER — Encounter: Payer: Self-pay | Admitting: Emergency Medicine

## 2024-03-15 DIAGNOSIS — S60519A Abrasion of unspecified hand, initial encounter: Secondary | ICD-10-CM

## 2024-03-15 DIAGNOSIS — Z203 Contact with and (suspected) exposure to rabies: Secondary | ICD-10-CM

## 2024-03-15 MED ORDER — RABIES IMMUNE GLOBULIN 300 UNIT/2ML IJ SOLN
20.0000 [IU]/kg | Freq: Once | INTRAMUSCULAR | Status: AC
  Administered 2024-03-15: 1200 [IU] via INTRAMUSCULAR

## 2024-03-15 MED ORDER — AMOXICILLIN-POT CLAVULANATE 875-125 MG PO TABS: 1.0000 | ORAL_TABLET | Freq: Two times a day (BID) | ORAL | 0 refills | Status: DC

## 2024-03-15 MED ORDER — TETANUS-DIPHTH-ACELL PERTUSSIS 5-2.5-18.5 LF-MCG/0.5 IM SUSY
0.5000 mL | PREFILLED_SYRINGE | Freq: Once | INTRAMUSCULAR | Status: AC
  Administered 2024-03-15: 0.5 mL via INTRAMUSCULAR

## 2024-03-15 MED ORDER — MUPIROCIN 2 % EX OINT: 1.0000 | TOPICAL_OINTMENT | Freq: Two times a day (BID) | CUTANEOUS | 0 refills | Status: DC

## 2024-03-15 MED ORDER — RABIES VACCINE, PCEC IM SUSR
1.0000 mL | Freq: Once | INTRAMUSCULAR | Status: AC
  Administered 2024-03-15: 1 mL via INTRAMUSCULAR

## 2024-03-15 NOTE — ED Triage Notes (Addendum)
 Pt presents with cat scratch to right hand that happened today. Site has hematoma where scratch occurred. Pt clean with peroxide  Pt last tdap was 2018. Cat is stray that comes to his house and he feeds
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
## 2024-03-15 NOTE — Discharge Instructions (Signed)
 You have been evaluated for an animal bite today.   Wound care: gently cleanse site at least 2 times a day and dress with non-stick gauze and wrap. Keep wound open to air at nighttime as long as it is not draining. Watch for any new or worsening signs of infection such as redness, swelling, pus, pain, or fever/chills.   You may take over the counter medicines as needed for pain (Tylenol  650 mg every 6 hours as needed for pain).  Please take the antibiotics prescribed to you in full, as directed (Augmentin  every 12 hours for 7 days). Apply mupirocin  ointment to the wound every 12 hours for 7 days.  Please return to urgent care on days 3, 7, and 14 for rabies series. It is very important that you return to receive the rest of the series to prevent rabies infection.  Return here or go to the Emergency Department if you experience worsening or uncontrolled pain, spreading redness, fevers 100.4 or greater, pus from your bite, or for any other concerning symptoms.

## 2024-03-18 ENCOUNTER — Ambulatory Visit
Admission: RE | Admit: 2024-03-18 | Discharge: 2024-03-18 | Disposition: A | Discharge status: Home / Self Care | Source: Ambulatory Visit | Attending: Physician Assistant | Admitting: Physician Assistant

## 2024-03-18 ENCOUNTER — Other Ambulatory Visit: Payer: Self-pay

## 2024-03-18 DIAGNOSIS — Z203 Contact with and (suspected) exposure to rabies: Secondary | ICD-10-CM | POA: Diagnosis not present

## 2024-03-18 MED ORDER — RABIES VACCINE, PCEC IM SUSR
1.0000 mL | Freq: Once | INTRAMUSCULAR | Status: AC
  Administered 2024-03-18: 1 mL via INTRAMUSCULAR

## 2024-03-18 NOTE — ED Triage Notes (Signed)
 Pt presents to urgent care today for second rabies vaccine . Was recently seen here on 8/25 for cat bite. First rabies vaccine  was given in right deltoid. No further concerns.

## 2024-03-22 ENCOUNTER — Other Ambulatory Visit: Payer: Self-pay

## 2024-03-22 ENCOUNTER — Ambulatory Visit
Admission: EM | Admit: 2024-03-22 | Discharge: 2024-03-22 | Disposition: A | Discharge status: Home / Self Care | Attending: Physician Assistant | Admitting: Physician Assistant

## 2024-03-22 DIAGNOSIS — Z203 Contact with and (suspected) exposure to rabies: Secondary | ICD-10-CM | POA: Diagnosis not present

## 2024-03-22 MED ORDER — RABIES VACCINE, PCEC IM SUSR
1.0000 mL | Freq: Once | INTRAMUSCULAR | Status: AC
  Administered 2024-03-22: 1 mL via INTRAMUSCULAR

## 2024-03-22 NOTE — ED Triage Notes (Signed)
 Pt presents to urgent care today for third rabies vaccine . Was seen here on 8/25 for cat bite. No concerns or complications from previous rabies vaccines. Second rabies vaccine  was given in left deltoid on 8/28.

## 2024-03-24 ENCOUNTER — Encounter: Payer: Self-pay | Admitting: Radiation Oncology

## 2024-03-26 ENCOUNTER — Other Ambulatory Visit: Payer: Self-pay | Admitting: Physician Assistant

## 2024-03-26 NOTE — Telephone Encounter (Signed)
 Last Fill: 12/29/2023  Labs: 12/29/2023 Patient remains anemic--improved slightly since starting to take iron.   Creatinine is slightly low. Rest of CMP WNL.  Lipid panel Wnl  Hgb A1C is 6.0%--please notify the patient and forward results to PCP as requested.   Next Visit: 06/02/2024  Last Visit: 12/29/2023  DX: Rheumatoid arthritis involving multiple sites with positive rheumatoid factor (HCC)   Current Dose per office note 12/29/2023: Methotrexate  8 tablets p.o. weekly   Okay to refill Methotrexate ?

## 2024-03-29 ENCOUNTER — Ambulatory Visit
Admission: EM | Admit: 2024-03-29 | Discharge: 2024-03-29 | Disposition: A | Discharge status: Home / Self Care | Attending: Emergency Medicine | Admitting: Emergency Medicine

## 2024-03-29 DIAGNOSIS — Z203 Contact with and (suspected) exposure to rabies: Secondary | ICD-10-CM

## 2024-03-29 MED ORDER — RABIES VACCINE, PCEC IM SUSR
1.0000 mL | Freq: Once | INTRAMUSCULAR | Status: AC
  Administered 2024-03-29: 1 mL via INTRAMUSCULAR

## 2024-03-29 NOTE — ED Triage Notes (Signed)
 Pt presents for 4th and final rabies vaccine . Denies any complications.

## 2024-04-03 DIAGNOSIS — Z23 Encounter for immunization: Secondary | ICD-10-CM | POA: Diagnosis not present

## 2024-04-13 ENCOUNTER — Telehealth: Payer: Self-pay | Admitting: Radiation Oncology

## 2024-04-13 DIAGNOSIS — C3412 Malignant neoplasm of upper lobe, left bronchus or lung: Secondary | ICD-10-CM

## 2024-04-13 NOTE — Telephone Encounter (Signed)
 I called and had to leave a message letting the patient know that Dr. Shelah reviewed his imaging and did not think it would be amenable to do a bronchoscopy as it would be difficult to navigate to the LUL nodule. Dr. Dewey recommends that rather, we repeat a CT scan in another month to see if there is any progressive change prior to committing to any retreatment and to likely follow the RUL nodule.

## 2024-04-14 ENCOUNTER — Encounter: Payer: Self-pay | Admitting: Radiation Oncology

## 2024-04-14 NOTE — Progress Notes (Signed)
 The pt called back with questions about his last message I sent. I tried to call him back to clarify the message I was trying to leave about recommendations for a repeat CT scan of the chest to evaluate the abnormality in the left lung nodule as well as the right. I've already put in a request for a repeat scan, and he will be contacted to coordinate this, we anticipate this taking place next month.

## 2024-04-20 DIAGNOSIS — Z23 Encounter for immunization: Secondary | ICD-10-CM | POA: Diagnosis not present

## 2024-04-28 ENCOUNTER — Telehealth: Payer: Self-pay | Admitting: *Deleted

## 2024-04-28 NOTE — Telephone Encounter (Signed)
 Called patient to inform of Ct for 05-03-24- arrival time- 5:15 pm @ [facility], no restrictions to scan, patient to check in @ ED, patient to receive phone call from Donald Husband on 05-24-24 @ 3pm  with results, spoke with patient and he verified understanding these appts. and the instructions

## 2024-05-03 ENCOUNTER — Ambulatory Visit (HOSPITAL_COMMUNITY)
Admission: RE | Admit: 2024-05-03 | Discharge: 2024-05-03 | Disposition: A | Discharge status: Home / Self Care | Source: Ambulatory Visit | Attending: Radiation Oncology | Admitting: Radiation Oncology

## 2024-05-03 DIAGNOSIS — C3412 Malignant neoplasm of upper lobe, left bronchus or lung: Secondary | ICD-10-CM | POA: Insufficient documentation

## 2024-05-03 DIAGNOSIS — I700 Atherosclerosis of aorta: Secondary | ICD-10-CM | POA: Diagnosis not present

## 2024-05-03 DIAGNOSIS — C3492 Malignant neoplasm of unspecified part of left bronchus or lung: Secondary | ICD-10-CM | POA: Diagnosis not present

## 2024-05-03 DIAGNOSIS — J432 Centrilobular emphysema: Secondary | ICD-10-CM | POA: Diagnosis not present

## 2024-05-03 LAB — POCT I-STAT CREATININE: Creatinine, Ser: 0.7 mg/dL (ref 0.61–1.24)

## 2024-05-03 MED ORDER — IOHEXOL 300 MG/ML  SOLN
75.0000 mL | Freq: Once | INTRAMUSCULAR | Status: AC | PRN
  Administered 2024-05-03: 75 mL via INTRAVENOUS

## 2024-05-03 MED ORDER — SODIUM CHLORIDE (PF) 0.9 % IJ SOLN
INTRAMUSCULAR | Status: AC
  Filled 2024-05-03: qty 50
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
## 2024-05-05 ENCOUNTER — Ambulatory Visit: Admitting: Physician Assistant

## 2024-05-05 ENCOUNTER — Encounter: Payer: Self-pay | Admitting: Physician Assistant

## 2024-05-05 ENCOUNTER — Other Ambulatory Visit: Payer: Self-pay

## 2024-05-05 VITALS — BP 120/66 | HR 68 | Ht 66.0 in | Wt 139.2 lb

## 2024-05-05 DIAGNOSIS — K229 Disease of esophagus, unspecified: Secondary | ICD-10-CM

## 2024-05-05 DIAGNOSIS — C3412 Malignant neoplasm of upper lobe, left bronchus or lung: Secondary | ICD-10-CM

## 2024-05-05 DIAGNOSIS — J449 Chronic obstructive pulmonary disease, unspecified: Secondary | ICD-10-CM

## 2024-05-05 DIAGNOSIS — I4892 Unspecified atrial flutter: Secondary | ICD-10-CM

## 2024-05-05 DIAGNOSIS — J9611 Chronic respiratory failure with hypoxia: Secondary | ICD-10-CM | POA: Diagnosis not present

## 2024-05-05 DIAGNOSIS — Z8679 Personal history of other diseases of the circulatory system: Secondary | ICD-10-CM

## 2024-05-05 DIAGNOSIS — M0579 Rheumatoid arthritis with rheumatoid factor of multiple sites without organ or systems involvement: Secondary | ICD-10-CM | POA: Diagnosis not present

## 2024-05-05 DIAGNOSIS — Z9981 Dependence on supplemental oxygen: Secondary | ICD-10-CM | POA: Diagnosis not present

## 2024-05-05 DIAGNOSIS — R9389 Abnormal findings on diagnostic imaging of other specified body structures: Secondary | ICD-10-CM

## 2024-05-05 NOTE — Patient Instructions (Addendum)
 You have been scheduled for a Barium Esophogram at [facility] (1st floor of the hospital) on  Thursday, 10-16 at 11:00 am. Please arrive 15 minutes prior to your appointment for registration. Make certain not to have anything to eat or drink 3 hours prior to your test. If you need to reschedule for any reason, please contact radiology at [phone] to do so. __________________________________________________________________ A barium swallow is an examination that concentrates on views of the esophagus. This tends to be a double contrast exam (barium and two liquids which, when combined, create a gas to distend the wall of the oesophagus) or single contrast (non-ionic iodine based). The study is usually tailored to your symptoms so a good history is essential. Attention is paid during the study to the form, structure and configuration of the esophagus, looking for functional disorders (such as aspiration, dysphagia, achalasia, motility and reflux) EXAMINATION You may be asked to change into a gown, depending on the type of swallow being performed. A radiologist and radiographer will perform the procedure. The radiologist will advise you of the type of contrast selected for your procedure and direct you during the exam. You will be asked to stand, sit or lie in several different positions and to hold a small amount of fluid in your mouth before being asked to swallow while the imaging is performed .In some instances you may be asked to swallow barium coated marshmallows to assess the motility of a solid food bolus. The exam can be recorded as a digital or video fluoroscopy procedure. POST PROCEDURE It will take 1-2 days for the barium to pass through your system. To facilitate this, it is important, unless otherwise directed, to increase your fluids for the next 24-48hrs and to resume your normal diet.  This test typically takes about 30 minutes to  perform. __________________________________________________________________________________   Thank you for entrusting me with your care and for choosing [facility], Alan Coombs, P.A.-C   _______________________________________________________  If your blood pressure at your visit was 140/90 or greater, please contact your primary care physician to follow up on this.  _______________________________________________________  If you are age 32 or older, your body mass index should be between 23-30. Your Body mass index is 22.47 kg/m. If this is out of the aforementioned range listed, please consider follow up with your Primary Care Provider.  If you are age 44 or younger, your body mass index should be between 19-25. Your Body mass index is 22.47 kg/m. If this is out of the aformentioned range listed, please consider follow up with your Primary Care Provider.   ________________________________________________________  The  GI providers would like to encourage you to use MYCHART to communicate with providers for non-urgent requests or questions.  Due to long hold times on the telephone, sending your provider a message by [facility] may be a faster and more efficient way to get a response.  Please allow 48 business hours for a response.  Please remember that this is for non-urgent requests.  _______________________________________________________  [facility] is using a team-based approach to care.  Your team is made up of your doctor and two to three APPS. Our APPS (Nurse Practitioners and Physician Assistants) work with your physician to ensure care continuity for you. They are fully qualified to address your health concerns and develop a treatment plan. They communicate directly with your gastroenterologist to care for you. Seeing the Advanced Practice Practitioners on your physician's team can help you by facilitating care more promptly, often allowing  for  earlier appointments, access to diagnostic testing, procedures, and other specialty referrals.

## 2024-05-06 ENCOUNTER — Ambulatory Visit (HOSPITAL_COMMUNITY)
Admission: RE | Admit: 2024-05-06 | Discharge: 2024-05-06 | Disposition: A | Discharge status: Home / Self Care | Source: Ambulatory Visit | Attending: Physician Assistant | Admitting: Physician Assistant

## 2024-05-06 ENCOUNTER — Telehealth: Payer: Self-pay

## 2024-05-06 DIAGNOSIS — R9389 Abnormal findings on diagnostic imaging of other specified body structures: Secondary | ICD-10-CM | POA: Diagnosis not present

## 2024-05-06 DIAGNOSIS — K229 Disease of esophagus, unspecified: Secondary | ICD-10-CM | POA: Diagnosis not present

## 2024-05-06 DIAGNOSIS — Z923 Personal history of irradiation: Secondary | ICD-10-CM | POA: Diagnosis not present

## 2024-05-06 DIAGNOSIS — T17320A Food in larynx causing asphyxiation, initial encounter: Secondary | ICD-10-CM | POA: Diagnosis not present

## 2024-05-06 DIAGNOSIS — C3490 Malignant neoplasm of unspecified part of unspecified bronchus or lung: Secondary | ICD-10-CM | POA: Diagnosis not present

## 2024-05-06 NOTE — Telephone Encounter (Signed)
 Received call from [facility] with [facility] confirming we can view results for patient's barium swallow. Will make provider aware it has resulted.

## 2024-05-07 ENCOUNTER — Other Ambulatory Visit: Payer: Self-pay | Admitting: Physician Assistant

## 2024-05-07 ENCOUNTER — Other Ambulatory Visit: Payer: Self-pay | Admitting: *Deleted

## 2024-05-07 ENCOUNTER — Ambulatory Visit: Payer: Self-pay | Admitting: Physician Assistant

## 2024-05-07 DIAGNOSIS — K229 Disease of esophagus, unspecified: Secondary | ICD-10-CM

## 2024-05-07 DIAGNOSIS — R7689 Other specified abnormal immunological findings in serum: Secondary | ICD-10-CM

## 2024-05-07 DIAGNOSIS — Z79899 Other long term (current) drug therapy: Secondary | ICD-10-CM | POA: Diagnosis not present

## 2024-05-07 DIAGNOSIS — T17900S Unspecified foreign body in respiratory tract, part unspecified causing asphyxiation, sequela: Secondary | ICD-10-CM

## 2024-05-07 DIAGNOSIS — M0579 Rheumatoid arthritis with rheumatoid factor of multiple sites without organ or systems involvement: Secondary | ICD-10-CM

## 2024-05-07 DIAGNOSIS — M1380 Other specified arthritis, unspecified site: Secondary | ICD-10-CM

## 2024-05-07 NOTE — Telephone Encounter (Signed)
 Last Fill: 03/26/2024 (30 day supply)   Labs: 12/29/2023 Patient remains anemic--improved slightly since starting to take iron.   Creatinine is slightly low. Rest of CMP WNL.  Lipid panel Wnl  Hgb A1C is 6.0%   Next Visit: 06/02/2024   Last Visit: 12/29/2023   DX: Rheumatoid arthritis involving multiple sites with positive rheumatoid factor   Current Dose per office note 12/29/2023: Methotrexate  8 tablets p.o. weekly   Patient advised he is due to update labs.    Okay to refill Methotrexate ?

## 2024-05-08 ENCOUNTER — Ambulatory Visit: Payer: Self-pay | Admitting: Rheumatology

## 2024-05-08 LAB — CBC WITH DIFFERENTIAL/PLATELET
Absolute Lymphocytes: 824 {cells}/uL — ABNORMAL LOW (ref 850–3900)
Absolute Monocytes: 1380 {cells}/uL — ABNORMAL HIGH (ref 200–950)
Basophils Absolute: 35 {cells}/uL (ref 0–200)
Basophils Relative: 0.3 %
Eosinophils Absolute: 267 {cells}/uL (ref 15–500)
Eosinophils Relative: 2.3 %
HCT: 36.6 % — ABNORMAL LOW (ref 38.5–50.0)
Hemoglobin: 11.8 g/dL — ABNORMAL LOW (ref 13.2–17.1)
MCH: 30 pg (ref 27.0–33.0)
MCHC: 32.2 g/dL (ref 32.0–36.0)
MCV: 93.1 fL (ref 80.0–100.0)
MPV: 11.4 fL (ref 7.5–12.5)
Monocytes Relative: 11.9 %
Neutro Abs: 9094 {cells}/uL — ABNORMAL HIGH (ref 1500–7800)
Neutrophils Relative %: 78.4 %
Platelets: 215 Thousand/uL (ref 140–400)
RBC: 3.93 Million/uL — ABNORMAL LOW (ref 4.20–5.80)
RDW: 13.5 % (ref 11.0–15.0)
Total Lymphocyte: 7.1 %
WBC: 11.6 Thousand/uL — ABNORMAL HIGH (ref 3.8–10.8)

## 2024-05-08 LAB — COMPREHENSIVE METABOLIC PANEL WITH GFR
AG Ratio: 1.3 (calc) (ref 1.0–2.5)
ALT: 13 U/L (ref 9–46)
AST: 16 U/L (ref 10–35)
Albumin: 4.2 g/dL (ref 3.6–5.1)
Alkaline phosphatase (APISO): 64 U/L (ref 35–144)
BUN/Creatinine Ratio: 17 (calc) (ref 6–22)
BUN: 10 mg/dL (ref 7–25)
CO2: 33 mmol/L — ABNORMAL HIGH (ref 20–32)
Calcium: 9.6 mg/dL (ref 8.6–10.3)
Chloride: 100 mmol/L (ref 98–110)
Creat: 0.59 mg/dL — ABNORMAL LOW (ref 0.70–1.28)
Globulin: 3.2 g/dL (ref 1.9–3.7)
Glucose, Bld: 92 mg/dL (ref 65–99)
Potassium: 4.8 mmol/L (ref 3.5–5.3)
Sodium: 140 mmol/L (ref 135–146)
Total Bilirubin: 0.9 mg/dL (ref 0.2–1.2)
Total Protein: 7.4 g/dL (ref 6.1–8.1)
eGFR: 99 mL/min/1.73m2 (ref >=60)

## 2024-05-08 NOTE — Progress Notes (Signed)
 White cell count is elevated, most likely due to recent infection or steroid use.  CMP is normal.  Please ask patient if he has any symptoms of infection.  Please forward results to his PCP.

## 2024-05-10 ENCOUNTER — Other Ambulatory Visit (HOSPITAL_COMMUNITY): Payer: Self-pay | Admitting: *Deleted

## 2024-05-10 ENCOUNTER — Telehealth (HOSPITAL_COMMUNITY): Payer: Self-pay | Admitting: *Deleted

## 2024-05-10 DIAGNOSIS — R1310 Dysphagia, unspecified: Secondary | ICD-10-CM

## 2024-05-10 DIAGNOSIS — H35372 Puckering of macula, left eye: Secondary | ICD-10-CM | POA: Diagnosis not present

## 2024-05-10 DIAGNOSIS — R059 Cough, unspecified: Secondary | ICD-10-CM

## 2024-05-10 DIAGNOSIS — H04123 Dry eye syndrome of bilateral lacrimal glands: Secondary | ICD-10-CM | POA: Diagnosis not present

## 2024-05-10 DIAGNOSIS — H40013 Open angle with borderline findings, low risk, bilateral: Secondary | ICD-10-CM | POA: Diagnosis not present

## 2024-05-10 DIAGNOSIS — R7309 Other abnormal glucose: Secondary | ICD-10-CM | POA: Diagnosis not present

## 2024-05-10 NOTE — Telephone Encounter (Signed)
 Attempted to contact patient to schedule OP MBS. Left VM @ [phone] requesting a call back. RKEEL

## 2024-05-11 ENCOUNTER — Telehealth: Payer: Self-pay | Admitting: Radiation Oncology

## 2024-05-11 NOTE — Telephone Encounter (Signed)
 I called and left a message for the patient to let him know his PET scan results and that Dr. Dewey recommended treating the nodule in the RUL, and following the left lung since it had not changed in size and remained more reassuring of the two. I asked him to call back to discuss.

## 2024-05-13 ENCOUNTER — Other Ambulatory Visit: Payer: Self-pay

## 2024-05-13 DIAGNOSIS — J9611 Chronic respiratory failure with hypoxia: Secondary | ICD-10-CM

## 2024-05-14 MED ORDER — ALBUTEROL SULFATE HFA 108 (90 BASE) MCG/ACT IN AERS: 1.0000 | INHALATION_SPRAY | RESPIRATORY_TRACT | 2 refills | Status: AC | PRN

## 2024-05-14 NOTE — Progress Notes (Signed)
 Agree with the assessment and plan as outlined by Quentin Mulling, PA-C. ? ?Keron Neenan, DO, FACG ? ?

## 2024-05-18 DIAGNOSIS — C3412 Malignant neoplasm of upper lobe, left bronchus or lung: Secondary | ICD-10-CM | POA: Diagnosis not present

## 2024-05-18 DIAGNOSIS — Z87891 Personal history of nicotine dependence: Secondary | ICD-10-CM | POA: Diagnosis not present

## 2024-05-19 ENCOUNTER — Ambulatory Visit
Admission: RE | Admit: 2024-05-19 | Discharge: 2024-05-19 | Disposition: A | Discharge status: Home / Self Care | Source: Ambulatory Visit | Attending: Radiation Oncology | Admitting: Radiation Oncology

## 2024-05-19 DIAGNOSIS — C3412 Malignant neoplasm of upper lobe, left bronchus or lung: Secondary | ICD-10-CM | POA: Diagnosis not present

## 2024-05-19 DIAGNOSIS — Z87891 Personal history of nicotine dependence: Secondary | ICD-10-CM | POA: Diagnosis not present

## 2024-05-20 NOTE — Progress Notes (Signed)
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 - Methotrexate  8 tablets p.o. weekly and folic acid  2 mg p.o. daily.  Labs obtained on March 07, 2024 were reviewed.  White cell count was elevated at 11.6 hemoglobin low at 11.8, CMP was normal.  Patient was advised to get labs every 3 months.  Information reimmunization was placed in the AVS.  He was advised to hold methotrexate  if he develops an infection resume until infection resolves.  Rheumatoid factor positive  Pain in both hands-he has intermittent discomfort in his wrist joints.  No synovitis was noted.  Bilateral MCP PIP and DIP thickening was noted.  Joint protection was discussed.  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  Pain in both feet-he has intermittent discomfort in his feet.  No synovitis was noted.  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  Patient had recurrence in September based on PET scan.  He started radiation therapy this week.  Patient states he will get total 6 doses.  I advised  him to get clearance on restarting methotrexate  from Dr. Dewey and hold methotrexate  for now.  History of gout - No hyperuricemia noted.  He is not on any treatment.  Osteopenia of multiple sites - DEXA 10/03/2022: The BMD measured at Femur Neck Right is 0.835 g/cm2 with a T-scoreof -1.5.  Calcium rich diet and vitamin D  was discussed.  Aortic atherosclerosis  History of atrial flutter  History of COPD - On 2 L of oxygen  at bedtime.  Using a pulse ox throughout the day to monitor his O2 sat especially on exertion.  Primary hypertension-blood pressure was normal.  History of tobacco abuse  History of iron deficiency anemia  Orders: No orders of the defined types were placed in this encounter.  No orders of the defined types were placed in this encounter.   Follow-Up  Instructions: Return in about 5 months (around 10/31/2024) for Rheumatoid arthritis.   Maya Nash, MD  Note - This record has been created using Animal nutritionist.  Chart creation errors have been sought, but may not always  have been located. Such creation errors do not reflect on  the standard of medical care.

## 2024-05-24 ENCOUNTER — Ambulatory Visit: Admitting: Radiation Oncology

## 2024-05-25 ENCOUNTER — Ambulatory Visit
Admission: RE | Admit: 2024-05-25 | Discharge: 2024-05-25 | Disposition: A | Discharge status: Home / Self Care | Source: Ambulatory Visit | Attending: Radiation Oncology | Admitting: Radiation Oncology

## 2024-05-25 DIAGNOSIS — Z87891 Personal history of nicotine dependence: Secondary | ICD-10-CM | POA: Insufficient documentation

## 2024-05-25 DIAGNOSIS — C3412 Malignant neoplasm of upper lobe, left bronchus or lung: Secondary | ICD-10-CM | POA: Diagnosis not present

## 2024-05-28 ENCOUNTER — Ambulatory Visit (HOSPITAL_COMMUNITY)
Admission: RE | Admit: 2024-05-28 | Discharge: 2024-05-28 | Disposition: A | Discharge status: Home / Self Care | Source: Ambulatory Visit | Attending: *Deleted | Admitting: *Deleted

## 2024-05-28 DIAGNOSIS — R1310 Dysphagia, unspecified: Secondary | ICD-10-CM

## 2024-05-28 DIAGNOSIS — K229 Disease of esophagus, unspecified: Secondary | ICD-10-CM | POA: Insufficient documentation

## 2024-05-28 DIAGNOSIS — R1314 Dysphagia, pharyngoesophageal phase: Secondary | ICD-10-CM | POA: Insufficient documentation

## 2024-05-28 DIAGNOSIS — R059 Cough, unspecified: Secondary | ICD-10-CM

## 2024-05-28 DIAGNOSIS — T17900S Unspecified foreign body in respiratory tract, part unspecified causing asphyxiation, sequela: Secondary | ICD-10-CM | POA: Diagnosis not present

## 2024-05-28 NOTE — Progress Notes (Addendum)
 Modified Barium Swallow Study  Patient Details  Name: Jason Carter MRN: [id number] Date of Birth: [birth year]  Today's Date: 05/28/2024  Modified Barium Swallow completed.  Full report located under Chart Review in the Imaging Section.  History of Present Illness Pt with a hx of recurrent lung cancer s/p radiation tx in 2022 and 2024. He presents today for OP MBSS due to aspiration observed on recent esophagram.   Clinical Impression  Mild pharyngoesophageal dysphagia per MBSS today. Risk of aspiration with larger sips as seen on esophagram. No aspiration seen on MBSS today.   Findings:  -Inconsistent, scant, shallow, stagnant penetration of thin and nectar-thick liquids observed during the swallow. Cued throat clear effective for ejecting penetrated residue.  -Trace-min base of tongue and vallecular residue observed post swallow of liquids, which reduced or cleared with cued dry swallow.  Oral phase grossly WNL for pt's age.   Pharyngeal deficits characterized by reduced laryngeal elevation, reduced base of tongue retraction, incomplete laryngeal vestibule closure, reduced pharyngeal stripping, and reduced distention of UES.   Esophageal sweep revealed some stagnant liquid and solid barium in mid-lower esophagus. No overt backflow through UES observed; however, did note some increased liquid residue in UES once camera was turned back on, suspicious for some backflow of liquids.   Detailed diet recommendations and aspiration precautions outlined below. Pt is at an increased risk of aspiration given aspiration observed with larger volumes on esophagram. Reviewed compensatory swallow strategies to reduce risk. Handout provided.   Factors that may increase risk of adverse event in presence of aspiration Jason Carter Carter): Poor general health and/or compromised immunity;Respiratory or GI disease;Frail or deconditioned  Swallow Evaluation Recommendations Recommendations: PO diet Liquid  Administration via: Cup;Straw Medication Administration: Whole meds with liquid Supervision: Patient able to self-feed Swallowing strategies  : Slow rate;Small bites/sips;Multiple dry swallows after each bite/sip Postural changes: Position pt fully upright for meals;Stay upright 30-60 min after meals Oral care recommendations: Oral care BID (2x/day)      Jason Carter Jason Carter 05/28/2024,4:25 PM

## 2024-05-31 ENCOUNTER — Ambulatory Visit: Payer: Self-pay | Admitting: Physician Assistant

## 2024-06-01 ENCOUNTER — Other Ambulatory Visit: Payer: Self-pay

## 2024-06-01 DIAGNOSIS — Z87891 Personal history of nicotine dependence: Secondary | ICD-10-CM | POA: Diagnosis not present

## 2024-06-01 DIAGNOSIS — C3412 Malignant neoplasm of upper lobe, left bronchus or lung: Secondary | ICD-10-CM | POA: Diagnosis not present

## 2024-06-01 LAB — RAD ONC ARIA SESSION SUMMARY
Course Elapsed Days: 0
Plan Fractions Treated to Date: 1
Plan Prescribed Dose Per Fraction: 12 Gy
Plan Total Fractions Prescribed: 5
Plan Total Prescribed Dose: 60 Gy
Reference Point Dosage Given to Date: 12 Gy
Reference Point Session Dosage Given: 12 Gy
Session Number: 1

## 2024-06-02 ENCOUNTER — Ambulatory Visit: Admitting: Radiation Oncology

## 2024-06-02 ENCOUNTER — Encounter: Payer: Self-pay | Admitting: Rheumatology

## 2024-06-02 ENCOUNTER — Ambulatory Visit: Attending: Rheumatology | Admitting: Rheumatology

## 2024-06-02 VITALS — BP 115/67 | HR 90 | Temp 98.1°F | Resp 17 | Ht 66.0 in | Wt 138.4 lb

## 2024-06-02 DIAGNOSIS — C3412 Malignant neoplasm of upper lobe, left bronchus or lung: Secondary | ICD-10-CM | POA: Diagnosis not present

## 2024-06-02 DIAGNOSIS — Z8739 Personal history of other diseases of the musculoskeletal system and connective tissue: Secondary | ICD-10-CM | POA: Insufficient documentation

## 2024-06-02 DIAGNOSIS — Z8709 Personal history of other diseases of the respiratory system: Secondary | ICD-10-CM | POA: Insufficient documentation

## 2024-06-02 DIAGNOSIS — M79671 Pain in right foot: Secondary | ICD-10-CM | POA: Insufficient documentation

## 2024-06-02 DIAGNOSIS — Z87891 Personal history of nicotine dependence: Secondary | ICD-10-CM | POA: Insufficient documentation

## 2024-06-02 DIAGNOSIS — M79642 Pain in left hand: Secondary | ICD-10-CM | POA: Insufficient documentation

## 2024-06-02 DIAGNOSIS — R7689 Other specified abnormal immunological findings in serum: Secondary | ICD-10-CM | POA: Insufficient documentation

## 2024-06-02 DIAGNOSIS — G5612 Other lesions of median nerve, left upper limb: Secondary | ICD-10-CM

## 2024-06-02 DIAGNOSIS — M79672 Pain in left foot: Secondary | ICD-10-CM | POA: Diagnosis present

## 2024-06-02 DIAGNOSIS — Z8679 Personal history of other diseases of the circulatory system: Secondary | ICD-10-CM | POA: Insufficient documentation

## 2024-06-02 DIAGNOSIS — I700 Atherosclerosis of aorta: Secondary | ICD-10-CM | POA: Insufficient documentation

## 2024-06-02 DIAGNOSIS — Z8669 Personal history of other diseases of the nervous system and sense organs: Secondary | ICD-10-CM | POA: Diagnosis not present

## 2024-06-02 DIAGNOSIS — I10 Essential (primary) hypertension: Secondary | ICD-10-CM | POA: Insufficient documentation

## 2024-06-02 DIAGNOSIS — M0579 Rheumatoid arthritis with rheumatoid factor of multiple sites without organ or systems involvement: Secondary | ICD-10-CM | POA: Diagnosis not present

## 2024-06-02 DIAGNOSIS — M79641 Pain in right hand: Secondary | ICD-10-CM | POA: Insufficient documentation

## 2024-06-02 DIAGNOSIS — Z79899 Other long term (current) drug therapy: Secondary | ICD-10-CM | POA: Insufficient documentation

## 2024-06-02 DIAGNOSIS — M8589 Other specified disorders of bone density and structure, multiple sites: Secondary | ICD-10-CM | POA: Insufficient documentation

## 2024-06-02 DIAGNOSIS — Z862 Personal history of diseases of the blood and blood-forming organs and certain disorders involving the immune mechanism: Secondary | ICD-10-CM | POA: Diagnosis present

## 2024-06-02 NOTE — Patient Instructions (Addendum)
  Please hold methotrexate  and discuss with Dr. Dewey when you can resume methotrexate     Standing Labs We placed an order today for your standing lab work.   Please have your standing labs drawn in January and every 3 months  Please have your labs drawn 2 weeks prior to your appointment so that the provider can discuss your lab results at your appointment, if possible.  Please note that you may see your imaging and lab results in MyChart before we have reviewed them. We will contact you once all results are reviewed. Please allow our office up to 72 hours to thoroughly review all of the results before contacting the office for clarification of your results.  WALK-IN LAB HOURS  Monday through Thursday from 8:00 am -12:30 pm and 1:00 pm-4:30 pm and Friday from 8:00 am-12:00 pm.  Patients with office visits requiring labs will be seen before walk-in labs.  You may encounter longer than normal wait times. Please allow additional time. Wait times may be shorter on  Monday and Thursday afternoons.  We do not book appointments for walk-in labs. We appreciate your patience and understanding with our staff.   Labs are drawn by [facility]. Please bring your co-pay at the time of your lab draw.  You may receive a bill from [facility] for your lab work.  Please note if you are on Hydroxychloroquine and and an order has been placed for a Hydroxychloroquine level,  you will need to have it drawn 4 hours or more after your last dose.  If you wish to have your labs drawn at another location, please call the office 24 hours in advance so we can fax the orders.  The office is located at [address], [address], [place], [place] [address]   If you have any questions regarding directions or hours of operation,  please call [phone].   As a reminder, please drink plenty of water prior to coming for your lab work. Thanks!   Vaccines You are taking a medication(s) that can suppress your immune system.  The  following immunizations are recommended: Flu annually RSV Covid-19  Td/Tdap (tetanus, diphtheria, pertussis) every 10 years Pneumonia (Prevnar 15 then Pneumovax 23 at least 1 year apart.  Alternatively, can take Prevnar 20 without needing additional dose) Shingrix: 2 doses from 4 weeks to 6 months apart  Please check with your PCP to make sure you are up to date.   If you have signs or symptoms of an infection or start antibiotics: First, call your PCP for workup of your infection. Hold your medication through the infection, until you complete your antibiotics, and until symptoms resolve if you take the following: Injectable medication (Actemra, Benlysta, Cimzia, Cosentyx, Enbrel, Humira, Kevzara, Orencia, Remicade, Simponi, Stelara, Taltz, Tremfya) Methotrexate  Leflunomide (Arava) Mycophenolate (Cellcept) Xeljanz, Olumiant, or Rinvoq

## 2024-06-03 ENCOUNTER — Other Ambulatory Visit: Payer: Self-pay

## 2024-06-03 ENCOUNTER — Telehealth: Payer: Self-pay | Admitting: Radiation Oncology

## 2024-06-03 ENCOUNTER — Ambulatory Visit
Admission: RE | Admit: 2024-06-03 | Discharge: 2024-06-03 | Disposition: A | Discharge status: Home / Self Care | Source: Ambulatory Visit | Attending: Radiation Oncology | Admitting: Radiation Oncology

## 2024-06-03 DIAGNOSIS — C3412 Malignant neoplasm of upper lobe, left bronchus or lung: Secondary | ICD-10-CM | POA: Diagnosis not present

## 2024-06-03 DIAGNOSIS — Z87891 Personal history of nicotine dependence: Secondary | ICD-10-CM | POA: Diagnosis not present

## 2024-06-03 LAB — RAD ONC ARIA SESSION SUMMARY
Course Elapsed Days: 2
Plan Fractions Treated to Date: 2
Plan Prescribed Dose Per Fraction: 12 Gy
Plan Total Fractions Prescribed: 5
Plan Total Prescribed Dose: 60 Gy
Reference Point Dosage Given to Date: 24 Gy
Reference Point Session Dosage Given: 12 Gy
Session Number: 2

## 2024-06-03 NOTE — Telephone Encounter (Signed)
 I spoke with the patient and he is in agreement to hold his methotrexate  until finishing radiaiton on 11/21, and I encouraged him to restart methotrexate  the week of 06/21/24. He is in agreement and will come for his treatment today.

## 2024-06-04 ENCOUNTER — Ambulatory Visit: Admitting: Radiation Oncology

## 2024-06-07 ENCOUNTER — Other Ambulatory Visit: Payer: Self-pay

## 2024-06-07 ENCOUNTER — Ambulatory Visit
Admission: RE | Admit: 2024-06-07 | Discharge: 2024-06-07 | Disposition: A | Discharge status: Home / Self Care | Source: Ambulatory Visit | Attending: Radiation Oncology | Admitting: Radiation Oncology

## 2024-06-07 ENCOUNTER — Ambulatory Visit (INDEPENDENT_AMBULATORY_CARE_PROVIDER_SITE_OTHER): Payer: Self-pay | Admitting: Student

## 2024-06-07 ENCOUNTER — Encounter: Payer: Self-pay | Admitting: Student

## 2024-06-07 VITALS — BP 127/61 | HR 100 | Temp 97.9°F | Ht 66.0 in | Wt 137.2 lb

## 2024-06-07 DIAGNOSIS — C3412 Malignant neoplasm of upper lobe, left bronchus or lung: Secondary | ICD-10-CM | POA: Diagnosis not present

## 2024-06-07 DIAGNOSIS — Z9981 Dependence on supplemental oxygen: Secondary | ICD-10-CM | POA: Diagnosis not present

## 2024-06-07 DIAGNOSIS — J9611 Chronic respiratory failure with hypoxia: Secondary | ICD-10-CM

## 2024-06-07 DIAGNOSIS — Z87891 Personal history of nicotine dependence: Secondary | ICD-10-CM | POA: Diagnosis not present

## 2024-06-07 DIAGNOSIS — Z0000 Encounter for general adult medical examination without abnormal findings: Secondary | ICD-10-CM

## 2024-06-07 LAB — RAD ONC ARIA SESSION SUMMARY
Course Elapsed Days: 6
Plan Fractions Treated to Date: 3
Plan Prescribed Dose Per Fraction: 12 Gy
Plan Total Fractions Prescribed: 5
Plan Total Prescribed Dose: 60 Gy
Reference Point Dosage Given to Date: 36 Gy
Reference Point Session Dosage Given: 12 Gy
Session Number: 3
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
## 2024-06-07 NOTE — Assessment & Plan Note (Signed)
 Patient requesting new oxygen  tank for better functionality, specifically Inogen Portable Oxygen . Patient Saturations on Room Air at Rest = 78%. Patient Saturations on Room Air while Ambulating = 85%. Patient Saturations on 2 Liters of oxygen  while Ambulating = 87%. Patient Saturations on 2 liter  of Oxygen   while sitting  for 2 mins = 96%. Referral for DME placed and will see if insurance will cover Inogen Portable Oxygen .   Orders:   AMB REFERRAL FOR DME

## 2024-06-07 NOTE — Progress Notes (Unsigned)
 SATURATION QUALIFICATIONS: (This note is used to comply with regulatory documentation for home oxygen )   Patient Saturations on Room Air at Rest = 78%   Patient Saturations on Room Air while Ambulating = 85%   Patient Saturations on 2 Liters of oxygen  while Ambulating = 87%    Patient Saturations on 2 liter  of Oxygen   while sitting  for 2 mins = 96%

## 2024-06-07 NOTE — Assessment & Plan Note (Signed)
 Patient reports not having colonoscopy in over 20 years. Reports last colonoscopy done resulted in 9 benign polyps. Patient will reach back out for colon cancer screening options once done with radiation treatment for lung cancer.

## 2024-06-08 NOTE — Assessment & Plan Note (Signed)
 Chronic, stable. Using 2 liter/min at home to maintain O2 saturation in high 90s. Some hypoxia on arrival to clinic today after walking through parking lot, but rebounded with 2 liter/min to normal. Desaturation with ambulation per above. Continue continuous supplemental O2 therapy. Will request re-evaluation for portable oxygen  concentrator so he can travel to [place]  to visit family. Orders:   AMB REFERRAL FOR DME

## 2024-06-08 NOTE — Assessment & Plan Note (Addendum)
 PET in August 2025 revealed enlarged and newly avid nodule in the RUL that is new since last scan. He is undergoing radiation therapy for this now. The left nodules are stable and under observation. He'll continue following with radiation oncology.
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
## 2024-06-09 ENCOUNTER — Ambulatory Visit
Admission: RE | Admit: 2024-06-09 | Discharge: 2024-06-09 | Disposition: A | Source: Ambulatory Visit | Attending: Radiation Oncology

## 2024-06-09 ENCOUNTER — Other Ambulatory Visit: Payer: Self-pay

## 2024-06-09 ENCOUNTER — Ambulatory Visit
Admission: RE | Admit: 2024-06-09 | Discharge: 2024-06-09 | Disposition: A | Source: Ambulatory Visit | Attending: Radiation Oncology | Admitting: Radiation Oncology

## 2024-06-09 ENCOUNTER — Ambulatory Visit: Admitting: Radiation Oncology

## 2024-06-09 DIAGNOSIS — C3412 Malignant neoplasm of upper lobe, left bronchus or lung: Secondary | ICD-10-CM | POA: Diagnosis not present

## 2024-06-09 DIAGNOSIS — Z87891 Personal history of nicotine dependence: Secondary | ICD-10-CM | POA: Diagnosis not present

## 2024-06-09 LAB — RAD ONC ARIA SESSION SUMMARY
Course Elapsed Days: 8
Plan Fractions Treated to Date: 4
Plan Prescribed Dose Per Fraction: 12 Gy
Plan Total Fractions Prescribed: 5
Plan Total Prescribed Dose: 60 Gy
Reference Point Dosage Given to Date: 48 Gy
Reference Point Session Dosage Given: 12 Gy
Session Number: 4

## 2024-06-10 ENCOUNTER — Ambulatory Visit: Admitting: Radiation Oncology

## 2024-06-11 ENCOUNTER — Ambulatory Visit
Admission: RE | Admit: 2024-06-11 | Discharge: 2024-06-11 | Disposition: A | Discharge status: Home / Self Care | Source: Ambulatory Visit | Attending: Radiation Oncology | Admitting: Radiation Oncology

## 2024-06-11 ENCOUNTER — Other Ambulatory Visit: Payer: Self-pay

## 2024-06-11 DIAGNOSIS — C3412 Malignant neoplasm of upper lobe, left bronchus or lung: Secondary | ICD-10-CM | POA: Diagnosis not present

## 2024-06-11 DIAGNOSIS — Z510 Encounter for antineoplastic radiation therapy: Secondary | ICD-10-CM | POA: Diagnosis not present

## 2024-06-11 DIAGNOSIS — Z87891 Personal history of nicotine dependence: Secondary | ICD-10-CM | POA: Diagnosis not present

## 2024-06-11 LAB — RAD ONC ARIA SESSION SUMMARY
Course Elapsed Days: 10
Plan Fractions Treated to Date: 5
Plan Prescribed Dose Per Fraction: 12 Gy
Plan Total Fractions Prescribed: 5
Plan Total Prescribed Dose: 60 Gy
Reference Point Dosage Given to Date: 60 Gy
Reference Point Session Dosage Given: 12 Gy
Session Number: 5

## 2024-06-11 NOTE — Progress Notes (Signed)
 Internal Medicine Clinic Attending  Case discussed with the resident at the time of the visit.  We reviewed the resident's history and exam and pertinent patient test results.  I agree with the assessment, diagnosis, and plan of care documented in the resident's note.

## 2024-06-13 NOTE — Radiation Completion Notes (Addendum)
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     The patient tolerated radiation.  The patient will receive a call in about one month from the radiation oncology department. He will have a new baseline CT scan in 6-8 weeks.      Donald KYM Husband, PAC

## 2024-06-21 ENCOUNTER — Ambulatory Visit

## 2024-06-21 ENCOUNTER — Other Ambulatory Visit: Payer: Self-pay | Admitting: Physician Assistant

## 2024-06-21 NOTE — Telephone Encounter (Signed)
 Last Fill: 05/07/2024 (30 day supply)  Labs: 05/07/2024 White cell count is elevated, most likely due to recent infection or steroid use. CMP is normal   Next Visit: 11/09/2024  Last Visit: 06/02/2024  DX: Rheumatoid arthritis involving multiple sites with positive rheumatoid factor   Current Dose per office note 06/02/2024: Methotrexate  8 tablets p.o. weekly   Okay to refill Methotrexate ?

## 2024-06-30 ENCOUNTER — Other Ambulatory Visit: Payer: Self-pay | Admitting: Radiation Oncology

## 2024-06-30 DIAGNOSIS — C3412 Malignant neoplasm of upper lobe, left bronchus or lung: Secondary | ICD-10-CM

## 2024-06-30 DIAGNOSIS — C3411 Malignant neoplasm of upper lobe, right bronchus or lung: Secondary | ICD-10-CM

## 2024-07-06 ENCOUNTER — Telehealth: Payer: Self-pay | Admitting: *Deleted

## 2024-07-06 NOTE — Telephone Encounter (Signed)
 Called patient to inform of CT for 07-19-24- arrival time- 3:45 pm @ [facility], no restrictions to scan, spoke with patient and he is aware of this scan and the instructions

## 2024-07-13 ENCOUNTER — Telehealth: Payer: Self-pay | Admitting: *Deleted

## 2024-07-13 NOTE — Telephone Encounter (Signed)
 Copied from CRM #[id number]. Topic: General - Other >> Jul 13, 2024 12:46 PM Shamecia H wrote: Reason for CRM: Patient was calling to follow up about the oxygen  tank that was battery less. He stated he has not heard from anyone. Could you assist? Callback number is [phone] also [phone].

## 2024-07-19 ENCOUNTER — Ambulatory Visit (HOSPITAL_COMMUNITY)
Admission: RE | Admit: 2024-07-19 | Discharge: 2024-07-19 | Disposition: A | Discharge status: Home / Self Care | Source: Ambulatory Visit | Attending: Radiation Oncology | Admitting: Radiation Oncology

## 2024-07-19 DIAGNOSIS — C3412 Malignant neoplasm of upper lobe, left bronchus or lung: Secondary | ICD-10-CM | POA: Diagnosis present

## 2024-07-19 DIAGNOSIS — C3411 Malignant neoplasm of upper lobe, right bronchus or lung: Secondary | ICD-10-CM | POA: Insufficient documentation

## 2024-07-19 LAB — POCT I-STAT CREATININE: Creatinine, Ser: 0.7 mg/dL (ref 0.61–1.24)

## 2024-07-19 MED ORDER — IOHEXOL 300 MG/ML  SOLN
75.0000 mL | Freq: Once | INTRAMUSCULAR | Status: AC | PRN
  Administered 2024-07-19: 75 mL via INTRAVENOUS

## 2024-08-05 ENCOUNTER — Ambulatory Visit: Payer: Self-pay | Admitting: Radiation Oncology

## 2024-08-05 ENCOUNTER — Other Ambulatory Visit: Payer: Self-pay | Admitting: Radiation Oncology

## 2024-08-05 DIAGNOSIS — C3412 Malignant neoplasm of upper lobe, left bronchus or lung: Secondary | ICD-10-CM

## 2024-08-05 DIAGNOSIS — C3411 Malignant neoplasm of upper lobe, right bronchus or lung: Secondary | ICD-10-CM

## 2024-08-05 DIAGNOSIS — C3490 Malignant neoplasm of unspecified part of unspecified bronchus or lung: Secondary | ICD-10-CM

## 2024-08-25 ENCOUNTER — Ambulatory Visit: Payer: Self-pay

## 2024-08-25 ENCOUNTER — Telehealth: Payer: Self-pay

## 2024-08-25 VITALS — BP 118/65 | HR 90 | Temp 97.8°F | Ht 66.0 in | Wt 139.0 lb

## 2024-08-25 DIAGNOSIS — Z0000 Encounter for general adult medical examination without abnormal findings: Secondary | ICD-10-CM

## 2024-08-25 NOTE — Telephone Encounter (Signed)
 RTC to patient stated is having a cough .  Coughing up clear to grayish color.  Thinks he may have pulled a muscle lifting his arms.  No fever and does have chills from time go time.  No shortness of breath,  Is willing to come in for an appointment to check thinks out.

## 2024-08-25 NOTE — Patient Instructions (Signed)
 [name],  Thank you for taking the time for your Medicare Wellness Visit. I appreciate your continued commitment to your health goals. Please review the care plan we discussed, and feel free to reach out if I can assist you further.  Please note that Annual Wellness Visits do not include a physical exam. Some assessments may be limited, especially if the visit was conducted virtually. If needed, we may recommend an in-person follow-up with your provider.  Ongoing Care Seeing your primary care provider every 3 to 6 months helps us  monitor your health and provide consistent, personalized care.   Referrals If a referral was made during today's visit and you haven't received any updates within two weeks, please contact the referred provider directly to check on the status.  Recommended Screenings:  Health Maintenance  Topic Date Due   Medicare Annual Wellness Visit  05/06/2024   COVID-19 Vaccine (8 - Pfizer risk 2025-26 season) 10/18/2024   DTaP/Tdap/Td vaccine (3 - Td or Tdap) 03/15/2034   Pneumococcal Vaccine for age over 3  Completed   Flu Shot  Completed   Hepatitis C Screening  Completed   Zoster (Shingles) Vaccine  Completed   Meningitis B Vaccine  Aged Out       08/25/2024    1:22 PM  Advanced Directives  Does Patient Have a Medical Advance Directive? Yes  Type of Advance Directive Living will;Healthcare Power of Attorney  Does patient want to make changes to medical advance directive? No - Patient declined  Copy of Healthcare Power of Attorney in Chart? No - copy requested    Vision: Annual vision screenings are recommended for early detection of glaucoma, cataracts, and diabetic retinopathy. These exams can also reveal signs of chronic conditions such as diabetes and high blood pressure.  Dental: Annual dental screenings help detect early signs of oral cancer, gum disease, and other conditions linked to overall health, including heart disease and diabetes.  Please see the  attached documents for additional preventive care recommendations.

## 2024-08-25 NOTE — Telephone Encounter (Signed)
 Triage call: Patient stated during his AWV today, that he has been coughing up phelm for the lat week and having like pain on the left side of his chest that's feels like a strained muscle.  Patient provided his vital signs: bp 118/65, hr 90, o2 97: on oxygen  as directed and temp 97.8 F.  Patient stated it could be from the radiation for his lung cancer.  I advised patient that I would forward to triage nurse to give him a call to get information.  Kyjuan Gause N. Tomie, LPN [facility] Annual Wellness Team Direct Dial: [phone]
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
## 2024-08-26 ENCOUNTER — Emergency Department (HOSPITAL_COMMUNITY)

## 2024-08-26 ENCOUNTER — Ambulatory Visit: Admitting: Student

## 2024-08-26 ENCOUNTER — Other Ambulatory Visit: Payer: Self-pay

## 2024-08-26 ENCOUNTER — Telehealth: Payer: Self-pay

## 2024-08-26 ENCOUNTER — Encounter (HOSPITAL_COMMUNITY): Payer: Self-pay

## 2024-08-26 ENCOUNTER — Observation Stay (HOSPITAL_COMMUNITY): Admission: EM | Admit: 2024-08-26 | Source: Home / Self Care | Admitting: Internal Medicine

## 2024-08-26 VITALS — BP 100/59 | HR 109 | Temp 97.9°F | Ht 66.0 in | Wt 138.6 lb

## 2024-08-26 DIAGNOSIS — R051 Acute cough: Secondary | ICD-10-CM

## 2024-08-26 DIAGNOSIS — J441 Chronic obstructive pulmonary disease with (acute) exacerbation: Principal | ICD-10-CM | POA: Diagnosis present

## 2024-08-26 DIAGNOSIS — M7989 Other specified soft tissue disorders: Secondary | ICD-10-CM

## 2024-08-26 DIAGNOSIS — J189 Pneumonia, unspecified organism: Secondary | ICD-10-CM

## 2024-08-26 DIAGNOSIS — J9611 Chronic respiratory failure with hypoxia: Secondary | ICD-10-CM | POA: Diagnosis present

## 2024-08-26 DIAGNOSIS — R059 Cough, unspecified: Secondary | ICD-10-CM | POA: Insufficient documentation

## 2024-08-26 DIAGNOSIS — A419 Sepsis, unspecified organism: Secondary | ICD-10-CM

## 2024-08-26 DIAGNOSIS — J449 Chronic obstructive pulmonary disease, unspecified: Secondary | ICD-10-CM | POA: Diagnosis present

## 2024-08-26 DIAGNOSIS — R079 Chest pain, unspecified: Secondary | ICD-10-CM

## 2024-08-26 LAB — BASIC METABOLIC PANEL WITH GFR
Anion gap: 8 (ref 5–15)
BUN: 13 mg/dL (ref 8–23)
CO2: 32 mmol/L (ref 22–32)
Calcium: 9.4 mg/dL (ref 8.9–10.3)
Chloride: 97 mmol/L — ABNORMAL LOW (ref 98–111)
Creatinine, Ser: 0.59 mg/dL — ABNORMAL LOW (ref 0.61–1.24)
GFR, Estimated: >60 mL/min
Glucose, Bld: 107 mg/dL — ABNORMAL HIGH (ref 70–99)
Potassium: 4 mmol/L (ref 3.5–5.1)
Sodium: 137 mmol/L (ref 135–145)

## 2024-08-26 LAB — I-STAT CG4 LACTIC ACID, ED
Lactic Acid, Venous: 0.4 mmol/L — ABNORMAL LOW (ref 0.5–1.9)
Lactic Acid, Venous: 0.8 mmol/L (ref 0.5–1.9)

## 2024-08-26 LAB — CBC WITH DIFFERENTIAL/PLATELET
Abs Immature Granulocytes: 0.08 10*3/uL — ABNORMAL HIGH (ref 0.00–0.07)
Basophils Absolute: 0 10*3/uL (ref 0.0–0.1)
Basophils Relative: 0 %
Eosinophils Absolute: 0 10*3/uL (ref 0.0–0.5)
Eosinophils Relative: 0 %
HCT: 31.3 % — ABNORMAL LOW (ref 39.0–52.0)
Hemoglobin: 9.3 g/dL — ABNORMAL LOW (ref 13.0–17.0)
Immature Granulocytes: 1 %
Lymphocytes Relative: 4 %
Lymphs Abs: 0.5 10*3/uL — ABNORMAL LOW (ref 0.7–4.0)
MCH: 27.7 pg (ref 26.0–34.0)
MCHC: 29.7 g/dL — ABNORMAL LOW (ref 30.0–36.0)
MCV: 93.2 fL (ref 80.0–100.0)
Monocytes Absolute: 1.2 10*3/uL — ABNORMAL HIGH (ref 0.1–1.0)
Monocytes Relative: 9 %
Neutro Abs: 11 10*3/uL — ABNORMAL HIGH (ref 1.7–7.7)
Neutrophils Relative %: 86 %
Platelets: 180 10*3/uL (ref 150–400)
RBC: 3.36 MIL/uL — ABNORMAL LOW (ref 4.22–5.81)
RDW: 16 % — ABNORMAL HIGH (ref 11.5–15.5)
WBC: 12.8 10*3/uL — ABNORMAL HIGH (ref 4.0–10.5)
nRBC: 0 % (ref 0.0–0.2)

## 2024-08-26 LAB — RESP PANEL BY RT-PCR (RSV, FLU A&B, COVID)  RVPGX2
Influenza A by PCR: NEGATIVE
Influenza B by PCR: NEGATIVE
Resp Syncytial Virus by PCR: NEGATIVE
SARS Coronavirus 2 by RT PCR: NEGATIVE

## 2024-08-26 LAB — PRO BRAIN NATRIURETIC PEPTIDE: Pro Brain Natriuretic Peptide: 344 pg/mL — ABNORMAL HIGH

## 2024-08-26 LAB — TROPONIN T, HIGH SENSITIVITY
Troponin T High Sensitivity: 13 ng/L (ref 0–19)
Troponin T High Sensitivity: 11 ng/L (ref 0–19)

## 2024-08-26 MED ORDER — SODIUM CHLORIDE 0.9 % IV SOLN
500.0000 mg | Freq: Once | INTRAVENOUS | Status: DC
  Filled 2024-08-26: qty 5

## 2024-08-26 MED ORDER — IOHEXOL 350 MG/ML SOLN
75.0000 mL | Freq: Once | INTRAVENOUS | Status: AC | PRN
  Administered 2024-08-26: 75 mL via INTRAVENOUS

## 2024-08-26 MED ORDER — METHYLPREDNISOLONE SODIUM SUCC 125 MG IJ SOLR
125.0000 mg | Freq: Once | INTRAMUSCULAR | Status: AC
  Administered 2024-08-26: 125 mg via INTRAVENOUS
  Filled 2024-08-26: qty 2

## 2024-08-26 MED ORDER — LACTATED RINGERS IV SOLN: INTRAVENOUS | Status: DC

## 2024-08-26 MED ORDER — IPRATROPIUM-ALBUTEROL 0.5-2.5 (3) MG/3ML IN SOLN
3.0000 mL | Freq: Once | RESPIRATORY_TRACT | Status: AC
  Administered 2024-08-26: 3 mL via RESPIRATORY_TRACT
  Filled 2024-08-26: qty 3

## 2024-08-26 MED ORDER — SODIUM CHLORIDE 0.9 % IV SOLN
2.0000 g | Freq: Once | INTRAVENOUS | Status: AC
  Administered 2024-08-26: 2 g via INTRAVENOUS
  Filled 2024-08-26: qty 20

## 2024-08-26 MED ORDER — SODIUM CHLORIDE 0.9 % IV SOLN
100.0000 mg | Freq: Once | INTRAVENOUS | Status: AC
  Administered 2024-08-26: 100 mg via INTRAVENOUS
  Filled 2024-08-26: qty 100

## 2024-08-26 NOTE — Hospital Course (Addendum)
 Feeling alright overall. Chest pain is intermittent. Thought it was a muscle pull. Lasts a few seconds, central, no radiation.  Coughing up a little bit of blood since October. Noticed in mucus. Not a lot. Stopped for a while then came back a few weeks ago.   SOB has worsened but maybe because of cold weather. No known sick contacts.   Appetite has declined this past week. Gets more full faster recently. Maybe not drinking enough water.   Instructed to have no more than 64 oz/day.    Denies fevers, chills. Can sometimes feel when heart is racing recently. 2 L O2  No other new symptoms.   With cough, sputum production (clear, sometimes    Albuterol  1 puff every 4 hours prn Aspirin  81 mg daily Folic Acid  2 mg daily Methotrexate  20 mg weekly  Iron pills 325 mg MWF

## 2024-08-26 NOTE — Telephone Encounter (Signed)
 Patient was seen in our clinic today by Dr.Bender. Dr.Bender recommended the patient to go to the ED. Patient is okay to drive, transportation offered but the patient declined. Glenda Palmer,RN and Gladys Herbin,RN were in the room as witnessed. Patient is being pushed to his car via wheelchair by Glenda Palmer,RN.

## 2024-08-26 NOTE — Progress Notes (Signed)
 "  Established Patient Office Visit  Subjective   Patient ID: Jason Carter, male    DOB: 01/22/1946  Age: 79 y.o. MRN: [id number]  Chief Complaint  Patient presents with   Cough   Chest Pain    No active chest pain, patient feels like he strained a muscle    Jason Carter is a 79 y.o. with a PMH of HFpEF, NSCLC lung cancer s/p radiation therapy who presents to the clinic for cough, increased DOE, hemoptysis 2 weeks prior, and chest discomfort. Please see problem based assessment and plan for additional details.   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   Healthcare maintenance 07/14/2021   Aortic atherosclerosis 11/09/2020   Chronic respiratory failure with hypoxia (HCC) 11/09/2020   Iron deficiency anemia 11/09/2020   History of atrial flutter 11/06/2020   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   Allergic rhinitis 05/25/2020   Malignant neoplasm of upper lobe of left lung (HCC) 05/25/2020   History of tobacco abuse 04/06/2020   Hypertension 04/06/2020   Chronic gout 02/14/2017   History of appendicitis 04/26/2011     Objective:     BP (!) 100/59   Pulse (!) 109   Temp 97.9 F (36.6 C) (Oral)   Ht 5' 6 (1.676 m)   Wt 138 lb 9.6 oz (62.9 kg)   SpO2 96%   BMI 22.37 kg/m  BP Readings from Last 3 Encounters:  08/26/24 118/69  08/26/24 (!) 100/59  08/25/24 118/65   Wt Readings from Last 3 Encounters:  08/26/24 138 lb 9.6 oz (62.9 kg)  08/25/24 139 lb (63 kg)  06/07/24 137 lb 3.2 oz (62.2 kg)      Physical Exam Vitals reviewed.  Constitutional:      Appearance: He is not toxic-appearing.  Cardiovascular:     Rate and Rhythm:  Regular rhythm. Tachycardia present.     Heart sounds: No murmur heard.    Comments: RLE>LLE 2+ edema   No chest wall pain No JVD present   Pulmonary:     Effort: Tachypnea and accessory muscle usage present.     Breath sounds: Normal breath sounds.     Comments: On 2L [place] Musculoskeletal:     Right lower leg: 2+ Edema present.     Left lower leg: 2+ Edema present.  Skin:    General: Skin is warm and dry.  Neurological:     Mental Status: He is alert.  Psychiatric:        Mood and Affect: Mood and affect normal.     Last metabolic panel Lab Results  Component Value Date   GLUCOSE 92 05/07/2024   NA 140 05/07/2024   K 4.8 05/07/2024   CL 100 05/07/2024   CO2 33 (H) 05/07/2024   BUN 10 05/07/2024   CREATININE 0.70 07/19/2024   EGFR 99 05/07/2024   CALCIUM 9.6 05/07/2024   PHOS 3.2 11/07/2020   PROT 7.4 05/07/2024   ALBUMIN 4.2 05/13/2022   LABGLOB 3.3 05/13/2022   AGRATIO 1.3 05/13/2022   BILITOT 0.9 05/07/2024   ALKPHOS 79 05/13/2022   AST 16 05/07/2024   ALT 13 05/07/2024   ANIONGAP 8 07/12/2021   Last lipids Lab  Results  Component Value Date   CHOL 138 12/29/2023   HDL 60 12/29/2023   LDLCALC 65 12/29/2023   TRIG 54 12/29/2023   CHOLHDL 2.3 12/29/2023      The 10-year ASCVD risk score (Arnett DK, et al., 2019) is: 23%    Assessment & Plan:   Problem List Items Addressed This Visit       Other   Chest pain - Primary   Patient reports few week history of left-sided chest pain that is present with exertion and resolves with rest.  He describes his chest pain/discomfort as a tingling sensation on the left side.  He believes that the tingling/discomfort sensation started after he was working with his arms overhead and possibly strained a muscle.  He also uses his left arm to carry the oxygen  tank while he ambulates.  EKG was performed which was rather unremarkable except for sinus tachycardia. Plan: - Patient was sent to emergency department for  evaluation of PE (see plan for acute cough) - If patient continues to have this discomfort with exertion, concern for stable angina and can try as needed sublingual nitroglycerin      Relevant Orders   EKG 12-Lead (Completed)   Cough   Patient presenting to the clinic with a history of change in his chronic cough that has increased in frequency the past few days as well as increase in dyspnea with exertion.  Patient also reported that he has had an increase in productive sputum, his sputum is currently clear but 2 weeks prior he was experiencing hemoptysis.  He denies fever or chills at this time.  On exam, patient is on chronic 2 L nasal cannula with an SpO2 percent of 98%, he is tachypneic, increased effort of breathing, and using accessory respiratory muscles.  With patient's concurrent complaint of chest discomfort, worsening cough, hemoptysis, tachypnea, increased effort of breathing on chronic 2 L nasal cannula as well as using accessory respiratory muscles along with physical exam findings of asymmetrical lower extremity edema and new tachycardia that is sustained in the 110s to 120s I am concerned for a pulmonary emboli in this patient with concurrent lung cancer.  Patient was advised to go to the emergency department for evaluation of PE.  Recommended use of EMS for safest arrival to the emergency department, patient declined and was able to transport himself.       Return for Pending ED evaluation .   Patient discussed and evaluated with Dr. Shawn Damien Lease, DO  "

## 2024-08-26 NOTE — ED Triage Notes (Signed)
 Presents in w/c, brought over from IM. Sent for concern for blood clots. Endorses sob, on 2L [place] at home, and numbness tingling down leg. Mentions here for US  to r/o clots. Pt alert, NAD, calm, interactive, resps e/u, speaking in clear complete sentences. Steady gait. On 2L [place].
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
## 2024-08-26 NOTE — ED Notes (Signed)
 Pt too cold for an accurate pulsox

## 2024-08-26 NOTE — Assessment & Plan Note (Signed)
 Patient reports few week history of left-sided chest pain that is present with exertion and resolves with rest.  He describes his chest pain/discomfort as a tingling sensation on the left side.  He believes that the tingling/discomfort sensation started after he was working with his arms overhead and possibly strained a muscle.  He also uses his left arm to carry the oxygen  tank while he ambulates.  EKG was performed which was rather unremarkable except for sinus tachycardia. Plan: - Patient was sent to emergency department for evaluation of PE (see plan for acute cough) - If patient continues to have this discomfort with exertion, concern for stable angina and can try as needed sublingual nitroglycerin

## 2024-08-26 NOTE — ED Provider Notes (Addendum)
 " Jason Carter EMERGENCY DEPARTMENT AT [facility] Provider Note   CSN: [id number] Arrival date & time: 08/26/24  1523     Patient presents with: Shortness of Breath   Jason Carter is a 79 y.o. male.   Patient here with shortness of breath chest pain cough.  History of lung cancer being treated for that with radiation.  History of A-fib.  Is not anticoagulated.  He has had cough shortness of breath for a while now.  Maybe low-grade fevers.  On 2 L of oxygen  at home.  Denies weakness numbness tingling.  Denies any active pain.  Denies any recent illness.  Uses inhalers at home.  History of COPD CHF atrial flutter.  The history is provided by the patient.       Prior to Admission medications  Medication Sig Start Date End Date Taking? Authorizing Provider  albuterol  (VENTOLIN  HFA) 108 (90 Base) MCG/ACT inhaler Inhale 1 puff into the lungs every 4 (four) hours as needed for wheezing or shortness of breath. 05/14/24   Elnora Ip, MD  aspirin  EC 81 MG tablet Take 1 tablet (81 mg total) by mouth daily. Swallow whole. 01/25/21   Waddell Danelle ORN, MD  Chlorpheniramine Maleate (ALLERGY PO) Take by mouth as needed.    [provider]  Dextran 70-Hypromellose, PF, (TEARS NATURALE FREE) 0.1-0.3 % SOLN Place 1 drop into both eyes daily as needed (Dry eye).    [provider]  diclofenac  Sodium (VOLTAREN ) 1 % GEL Apply 2 g topically 4 (four) times daily. Patient taking differently: Apply 2 g topically as needed. 12/05/20   Arminda Daved HERO, MD  fluticasone  (FLONASE ) 50 MCG/ACT nasal spray SHAKE LIQUID AND USE 1 SPRAY IN EACH NOSTRIL DAILY Patient taking differently: as needed. 03/12/21   Barbaraann Katz, MD  folic acid  (FOLVITE ) 1 MG tablet TAKE 2 TABLETS BY MOUTH EVERY DAY 10/20/23   Cheryl Waddell HERO, PA-C  hydrocortisone cream 1 % Apply 1 application topically daily as needed for itching.    [provider]  methotrexate  (RHEUMATREX) 2.5 MG tablet TAKE 8  TABLETS BY MOUTH ONE TIME PER WEEK 06/21/24   Cheryl Waddell HERO, PA-C  Multiple Vitamin (MULTIVITAMIN WITH MINERALS) TABS tablet Take 1 tablet by mouth daily. Centrum 50+    [provider]  mupirocin  ointment (BACTROBAN ) 2 % Apply 1 Application topically 2 (two) times daily. Patient not taking: Reported on 06/02/2024 03/15/24   Enedelia Dorna HERO, FNP  OXYGEN  Inhale into the lungs as directed.    [provider]    Allergies: Patient has no known allergies.    Review of Systems  Updated Vital Signs BP (!) 103/59   Pulse 97   Temp 98.4 F (36.9 C) (Oral)   Resp 20   SpO2 100%   Physical Exam Vitals and nursing note reviewed.  Constitutional:      General: He is not in acute distress.    Appearance: He is well-developed.  HENT:     Head: Normocephalic and atraumatic.  Eyes:     Extraocular Movements: Extraocular movements intact.     Conjunctiva/sclera: Conjunctivae normal.     Pupils: Pupils are equal, round, and reactive to light.  Cardiovascular:     Rate and Rhythm: Normal rate and regular rhythm.     Heart sounds: No murmur heard. Pulmonary:     Effort: Pulmonary effort is normal. No respiratory distress.     Breath sounds: Decreased breath sounds and wheezing present.  Abdominal:  Palpations: Abdomen is soft.     Tenderness: There is no abdominal tenderness.  Musculoskeletal:        General: No swelling. Normal range of motion.     Cervical back: Normal range of motion and neck supple.     Right lower leg: No edema.     Left lower leg: No edema.  Skin:    General: Skin is warm and dry.     Capillary Refill: Capillary refill takes less than 2 seconds.  Neurological:     General: No focal deficit present.     Mental Status: He is alert.  Psychiatric:        Mood and Affect: Mood normal.     (all labs ordered are listed, but only abnormal results are displayed) Labs Reviewed  BASIC METABOLIC PANEL WITH GFR - Abnormal; Notable for the  following components:      Result Value   Chloride 97 (*)    Glucose, Bld 107 (*)    Creatinine, Ser 0.59 (*)    All other components within normal limits  CBC WITH DIFFERENTIAL/PLATELET - Abnormal; Notable for the following components:   WBC 12.8 (*)    RBC 3.36 (*)    Hemoglobin 9.3 (*)    HCT 31.3 (*)    MCHC 29.7 (*)    RDW 16.0 (*)    Neutro Abs 11.0 (*)    Lymphs Abs 0.5 (*)    Monocytes Absolute 1.2 (*)    Abs Immature Granulocytes 0.08 (*)    All other components within normal limits  PRO BRAIN NATRIURETIC PEPTIDE - Abnormal; Notable for the following components:   Pro Brain Natriuretic Peptide 344.0 (*)    All other components within normal limits  RESP PANEL BY RT-PCR (RSV, FLU A&B, COVID)  RVPGX2  CULTURE, BLOOD (ROUTINE X 2)  CULTURE, BLOOD (ROUTINE X 2)  I-STAT CG4 LACTIC ACID, ED  TROPONIN T, HIGH SENSITIVITY  TROPONIN T, HIGH SENSITIVITY    EKG: None  Radiology: CT Angio Chest PE W and/or Wo Contrast Result Date: 08/26/2024 EXAM: CTA of the Chest with contrast for PE 08/26/2024 07:56:01 PM TECHNIQUE: CTA of the chest was performed without and with the administration of 75 mL of iohexol  (OMNIPAQUE ) 350 MG/ML injection. Multiplanar reformatted images are provided for review. MIP images are provided for review. Automated exposure control, iterative reconstruction, and/or weight based adjustment of the mA/kV was utilized to reduce the radiation dose to as low as reasonably achievable. COMPARISON: Same day x-ray and CT chest / 12 / 29 / 25. CLINICAL HISTORY: Pulmonary embolism (PE) suspected, high probability. Shortness of breath FINDINGS: PULMONARY ARTERIES: Pulmonary arteries are adequately opacified for evaluation. Negative for pulmonary embolism. Main pulmonary artery is normal in caliber. MEDIASTINUM: Cardiomegaly with dilated right ventricle. Coronary artery and aortic atherosclerotic calcifications. There is no acute abnormality of the thoracic aorta. LYMPH NODES:  Calcified mediastinal and hilar lymph nodes. No axillary lymphadenopathy. LUNGS AND PLEURA: Advanced emphysema. Thick walled cavitary lesion in the left lung apex is similar to 12 / 29 / 25. There is new consolidation within the left upper lobe inferior to the cavitary lesion extending inferiorly into the lingula. Additional small ground-glass opacities in the left lower lobe. Nodule in the medial right apex measuring 13 x 6 mm is unchanged. No pleural effusion or pneumothorax. UPPER ABDOMEN: Limited images of the upper abdomen are unremarkable. SOFT TISSUES AND BONES: Chronic sternal fracture. No acute soft tissue abnormality. IMPRESSION: 1. No pulmonary embolism. 2.  New consolidation in the left upper lobe and ground-glass opacities in the right lower lobe are likely due to pneumonia. Given rapid progression since 12 / 29 / 25 progression of malignancy is considered less likely but not excluded. 3. Unchanged cavitary lesion in the left apex and 13 mm right apical pulmonary nodule. 4. Advanced emphysema. Electronically signed by: Norman Gatlin MD 08/26/2024 08:11 PM EST RP Workstation: [id number]   VAS US  LOWER EXTREMITY VENOUS (DVT) (7a-5p) Result Date: 08/26/2024  Lower Venous DVT Study Patient Name:  GENEVA PALLAS  Date of Exam:   08/26/2024 Medical Rec #: [id number]    Accession #:    [id number] Date of Birth: September 05, 1945    Patient Gender: M Patient Age:   58 years Exam Location:  [facility] Procedure:      VAS US  LOWER EXTREMITY VENOUS (DVT) Referring Phys: LEITA MURPHY --------------------------------------------------------------------------------  Indications: Swelling, Edema, and SOB.  Comparison Study: No prior exam. Performing Technologist: Edilia Elden Appl  Examination Guidelines: A complete evaluation includes B-mode imaging, spectral Doppler, color Doppler, and power Doppler as needed of all accessible portions of each vessel. Bilateral testing is considered an integral part of a complete  examination. Limited examinations for reoccurring indications may be performed as noted. The reflux portion of the exam is performed with the patient in reverse Trendelenburg.  +---------+---------------+---------+-----------+----------+--------------+ RIGHT    CompressibilityPhasicitySpontaneityPropertiesThrombus Aging +---------+---------------+---------+-----------+----------+--------------+ CFV      Full           Yes      Yes                                 +---------+---------------+---------+-----------+----------+--------------+ SFJ      Full           Yes      Yes                                 +---------+---------------+---------+-----------+----------+--------------+ FV Prox  Full                                                        +---------+---------------+---------+-----------+----------+--------------+ FV Mid   Full                                                        +---------+---------------+---------+-----------+----------+--------------+ FV DistalFull                                                        +---------+---------------+---------+-----------+----------+--------------+ PFV      Full                                                        +---------+---------------+---------+-----------+----------+--------------+ POP  Full           Yes      Yes                                 +---------+---------------+---------+-----------+----------+--------------+ PTV      Full                                                        +---------+---------------+---------+-----------+----------+--------------+ PERO     Full                                                        +---------+---------------+---------+-----------+----------+--------------+   +---------+---------------+---------+-----------+----------+--------------+ LEFT     CompressibilityPhasicitySpontaneityPropertiesThrombus Aging  +---------+---------------+---------+-----------+----------+--------------+ CFV      Full           Yes      Yes                                 +---------+---------------+---------+-----------+----------+--------------+ SFJ      Full           Yes      Yes                                 +---------+---------------+---------+-----------+----------+--------------+ FV Prox  Full                                                        +---------+---------------+---------+-----------+----------+--------------+ FV Mid   Full                                                        +---------+---------------+---------+-----------+----------+--------------+ FV DistalFull                                                        +---------+---------------+---------+-----------+----------+--------------+ PFV      Full                                                        +---------+---------------+---------+-----------+----------+--------------+ POP      Full           Yes      Yes                                 +---------+---------------+---------+-----------+----------+--------------+  PTV      Full                                                        +---------+---------------+---------+-----------+----------+--------------+ PERO     Full                                                        +---------+---------------+---------+-----------+----------+--------------+     Summary: BILATERAL: - No evidence of deep vein thrombosis seen in the lower extremities, bilaterally. -No evidence of popliteal cyst, bilaterally.   *See table(s) above for measurements and observations. Electronically signed by Norman Serve on 08/26/2024 at 6:32:04 PM.    Final    DG Chest 1 View Result Date: 08/26/2024 EXAM: 1 VIEW(S) XRAY OF THE CHEST 08/26/2024 05:09:00 PM COMPARISON: CT 12 / 29 / 25. CLINICAL HISTORY: Shortness of breath. FINDINGS: LUNGS AND PLEURA: Left lower lung zone  patchy airspace opacity. Spiculated cavitary opacity with adjacent surgical clips overlying left upper lung. Blunting of both costophrenic angles due to pleural thickening or trace pleural effusions. No pneumothorax. HEART AND MEDIASTINUM: Atherosclerotic plaque. No acute abnormality of the cardiac and mediastinal silhouettes. BONES AND SOFT TISSUES: No acute osseous abnormality. IMPRESSION: 1. Left basilar opacity suggesting pneumonia or aspiration. 2. Spiculated left upper lung cavitary opacity with adjacent surgical clip, better evaluated on CT 12 / 29 / 25. Electronically signed by: Norman Gatlin MD 08/26/2024 05:30 PM EST RP Workstation: [id number]     .Critical Care  Performed by: Ruthe Cornet, DO Authorized by: Ruthe Cornet, DO   Critical care provider statement:    Critical care time (minutes):  35   Critical care was necessary to treat or prevent imminent or life-threatening deterioration of the following conditions:  Sepsis and respiratory failure   Critical care was time spent personally by me on the following activities:  Blood draw for specimens, development of treatment plan with patient or surrogate, discussions with consultants, discussions with primary provider, evaluation of patient's response to treatment, examination of patient, obtaining history from patient or surrogate, ordering and performing treatments and interventions, ordering and review of laboratory studies, ordering and review of radiographic studies, pulse oximetry, re-evaluation of patient's condition and review of old charts    Medications Ordered in the ED  ipratropium-albuterol  (DUONEB) 0.5-2.5 (3) MG/3ML nebulizer solution 3 mL (has no administration in time range)  methylPREDNISolone  sodium succinate (SOLU-MEDROL ) 125 mg/2 mL injection 125 mg (has no administration in time range)  cefTRIAXone  (ROCEPHIN ) 2 g in sodium chloride  0.9 % 100 mL IVPB (has no administration in time range)  azithromycin  (ZITHROMAX )  500 mg in sodium chloride  0.9 % 250 mL IVPB (has no administration in time range)  lactated ringers  infusion (has no administration in time range)  iohexol  (OMNIPAQUE ) 350 MG/ML injection 75 mL (75 mLs Intravenous Contrast Given 08/26/24 1957)                                    Medical Decision Making Amount and/or Complexity of Data Reviewed Radiology: ordered.  Risk Prescription drug management. Decision regarding hospitalization.  Jason Carter is here with shortness of breath.  Normal vitals.  No fever.  He is already had workup mostly done prior to my evaluation.  Was sent here by internal medicine clinic.  Having some discomfort in his legs.  He had a DVT study that was negative.  He has had cough he is got diminished movement in his lungs.  May be wheezing.  History of COPD CHF paroxysmal A-fib.  Undergoing treatment for lung cancer.  COVID flu RSV test negative.  EKG shows sinus rhythm.  No ischemic changes.  Troponin negative x 2.  I do think that this is likely COPD exacerbation possibly with blood clot or pneumonia.  Troponins are normal.  He is got a mild leukocytosis.  He had a low-grade temp but no true fever here.  proBNP is unremarkable.  Does not seem like this is volume overload or heart failure.  Decided to pursue CT scan to further evaluate for infectious process versus PE.  CT scan showed new consolidation in the left upper lobe and right lower lobe likely due to pneumonia given the rapid progression since CT scan a few weeks ago.  Seems less likely to be malignancy.  He has unchanged cavitary lesion otherwise.  Overall I do think this is likely pneumonia with COPD exacerbation will start IV antibiotics he is ready been given breathing treatments steroids.  He is on his home oxygen .  Does not quite meet sepsis criteria but does have heart rate greater than 90 with mild white count and pneumonia and will add blood cultures lactic acid for further care.  And will admit to  medicine.  This chart was dictated using voice recognition software.  Despite best efforts to proofread,  errors can occur which can change the documentation meaning.      Final diagnoses:  COPD exacerbation (HCC)  Community acquired pneumonia, unspecified laterality  Sepsis, due to unspecified organism, unspecified whether acute organ dysfunction present [facility])    ED Discharge Orders     None          Ruthe Cornet, DO 08/26/24 2049    Ruthe Cornet, DO 08/26/24 2050  "

## 2024-08-26 NOTE — Assessment & Plan Note (Signed)
 Patient presenting to the clinic with a history of change in his chronic cough that has increased in frequency the past few days as well as increase in dyspnea with exertion.  Patient also reported that he has had an increase in productive sputum, his sputum is currently clear but 2 weeks prior he was experiencing hemoptysis.  He denies fever or chills at this time.  On exam, patient is on chronic 2 L nasal cannula with an SpO2 percent of 98%, he is tachypneic, increased effort of breathing, and using accessory respiratory muscles.  With patient's concurrent complaint of chest discomfort, worsening cough, hemoptysis, tachypnea, increased effort of breathing on chronic 2 L nasal cannula as well as using accessory respiratory muscles along with physical exam findings of asymmetrical lower extremity edema and new tachycardia that is sustained in the 110s to 120s I am concerned for a pulmonary emboli in this patient with concurrent lung cancer.  Patient was advised to go to the emergency department for evaluation of PE.  Recommended use of EMS for safest arrival to the emergency department, patient declined and was able to transport himself.

## 2024-08-26 NOTE — ED Notes (Signed)
 Unable to collect second set of blood cultures. Notified mini lab. Abx started

## 2024-08-26 NOTE — ED Provider Triage Note (Signed)
 Emergency Medicine Provider Triage Evaluation Note  Jason Carter , a 79 y.o. male  was evaluated in triage.  Pt complains of SHOB, CP, lower ext swelling, sent by PCP with concern for PE/DVT.  Hx a fib, on ASA. 2L [place] baseline O2.  Review of Systems  Positive: CP, SHOB, leg swelling Negative: Fevers, leg pain  Physical Exam  BP 118/69   Pulse 100   Temp 99.1 F (37.3 C)   Resp 19   SpO2 98%  Gen:   Awake, no distress   Resp:  Normal effort  MSK:   Moves extremities without difficulty, lower ext swelling without significant pitting, no calf tenderness or palpable cords.   Other:    Medical Decision Making  Medically screening exam initiated at 4:53 PM.  Appropriate orders placed.  Jason Carter was informed that the remainder of the evaluation will be completed by another provider, this initial triage assessment does not replace that evaluation, and the importance of remaining in the ED until their evaluation is complete.     Beverley Leita LABOR, PA-C 08/26/24 1655

## 2024-08-26 NOTE — Progress Notes (Signed)
 Bilateral lower extremity venous duplex has been completed.  Results can be found in chart review under CV Proc.  08/26/2024 6:11 PM  Madylin Fairbank Elden Appl, RVT.

## 2024-08-26 NOTE — Progress Notes (Signed)
 Internal Medicine Clinic Attending  I was physically present during the key portions of the resident provided service and participated in the medical decision making of patient's management care. I reviewed pertinent patient test results.  The assessment, diagnosis, and plan were formulated together and I agree with the documentation in the resident's note.  63M PMH RA, NSCLC of lung s/p SBRT/chemo, COPD c/b chronic hypoxic respiratory failure, previous Aflutter s/p ablation 2022 no longer on [facility] who presents with new chest pain/dyspnea on exertion, cough and intermittent hemoptysis. He notes worsening tachycardia and relative hypotension at home. Today in office, initial HR 122 satting 90% on 2L which improved to HR 109, SpO2 96 on 2L after ~40 minutes. Exam as noted below. Given his symptoms, vitals and exam findings, we recommend ED evaluation for PE or ACS given his risk factors (active malignancy, inflammatory arthritis) which he was agreeable with.  Shawn Sick, MD

## 2024-08-26 NOTE — Sepsis Progress Note (Signed)
 Elink monitoring for the code sepsis protocol.

## 2024-08-27 ENCOUNTER — Telehealth (HOSPITAL_COMMUNITY): Payer: Self-pay

## 2024-08-27 ENCOUNTER — Other Ambulatory Visit (HOSPITAL_COMMUNITY): Payer: Self-pay

## 2024-08-27 DIAGNOSIS — J441 Chronic obstructive pulmonary disease with (acute) exacerbation: Principal | ICD-10-CM | POA: Diagnosis present

## 2024-08-27 DIAGNOSIS — J189 Pneumonia, unspecified organism: Secondary | ICD-10-CM

## 2024-08-27 LAB — RESPIRATORY PANEL BY PCR

## 2024-08-27 LAB — BASIC METABOLIC PANEL WITH GFR
Anion gap: 13 (ref 5–15)
BUN: 12 mg/dL (ref 8–23)
CO2: 25 mmol/L (ref 22–32)
Calcium: 8.7 mg/dL — ABNORMAL LOW (ref 8.9–10.3)
Chloride: 99 mmol/L (ref 98–111)
Creatinine, Ser: 0.54 mg/dL — ABNORMAL LOW (ref 0.61–1.24)
GFR, Estimated: >60 mL/min
Glucose, Bld: 135 mg/dL — ABNORMAL HIGH (ref 70–99)
Potassium: 4.1 mmol/L (ref 3.5–5.1)
Sodium: 137 mmol/L (ref 135–145)

## 2024-08-27 LAB — IRON AND TIBC
Iron: 14 ug/dL — ABNORMAL LOW (ref 45–182)
Saturation Ratios: 6 % — ABNORMAL LOW (ref 17.9–39.5)
TIBC: 210 ug/dL — ABNORMAL LOW (ref 250–450)
UIBC: 197 ug/dL

## 2024-08-27 LAB — CULTURE, BLOOD (ROUTINE X 2)
Culture: NO GROWTH
Culture: NO GROWTH
Special Requests: ADEQUATE
Special Requests: ADEQUATE

## 2024-08-27 LAB — MRSA NEXT GEN BY PCR, NASAL: MRSA by PCR Next Gen: NOT DETECTED

## 2024-08-27 LAB — CBC
HCT: 30.5 % — ABNORMAL LOW (ref 39.0–52.0)
Hemoglobin: 9.1 g/dL — ABNORMAL LOW (ref 13.0–17.0)
MCH: 27.8 pg (ref 26.0–34.0)
MCHC: 29.8 g/dL — ABNORMAL LOW (ref 30.0–36.0)
MCV: 93.3 fL (ref 80.0–100.0)
Platelets: 150 10*3/uL (ref 150–400)
RBC: 3.27 MIL/uL — ABNORMAL LOW (ref 4.22–5.81)
RDW: 16 % — ABNORMAL HIGH (ref 11.5–15.5)
WBC: 10.3 10*3/uL (ref 4.0–10.5)
nRBC: 0 % (ref 0.0–0.2)

## 2024-08-27 LAB — RETICULOCYTES
Immature Retic Fract: 12.8 % (ref 2.3–15.9)
RBC.: 3.15 MIL/uL — ABNORMAL LOW (ref 4.22–5.81)
Retic Count, Absolute: 23 10*3/uL (ref 19.0–186.0)
Retic Ct Pct: 0.7 % (ref 0.4–3.1)

## 2024-08-27 LAB — EXPECTORATED SPUTUM ASSESSMENT W GRAM STAIN, RFLX TO RESP C

## 2024-08-27 LAB — VITAMIN B12: Vitamin B-12: 1576 pg/mL — ABNORMAL HIGH (ref 180–914)

## 2024-08-27 LAB — FOLATE: Folate: >20 ng/mL

## 2024-08-27 LAB — FERRITIN: Ferritin: 437 ng/mL — ABNORMAL HIGH (ref 24–336)

## 2024-08-27 MED ORDER — FOLIC ACID 1 MG PO TABS
2.0000 mg | ORAL_TABLET | Freq: Every day | ORAL | Status: AC
  Administered 2024-08-27: 2 mg via ORAL
  Filled 2024-08-27: qty 2

## 2024-08-27 MED ORDER — ASPIRIN 81 MG PO TBEC
81.0000 mg | DELAYED_RELEASE_TABLET | Freq: Every day | ORAL | Status: AC
  Administered 2024-08-27: 81 mg via ORAL
  Filled 2024-08-27: qty 1

## 2024-08-27 MED ORDER — ENOXAPARIN SODIUM 40 MG/0.4ML IJ SOSY
40.0000 mg | PREFILLED_SYRINGE | Freq: Every day | INTRAMUSCULAR | Status: AC
  Administered 2024-08-27: 40 mg via SUBCUTANEOUS
  Filled 2024-08-27: qty 0.4

## 2024-08-27 MED ORDER — POLYVINYL ALCOHOL 1.4 % OP SOLN: 1.0000 [drp] | Freq: Every day | OPHTHALMIC | Status: AC | PRN

## 2024-08-27 MED ORDER — PREDNISONE 20 MG PO TABS: 40.0000 mg | ORAL_TABLET | Freq: Every day | ORAL | Status: DC

## 2024-08-27 MED ORDER — UMECLIDINIUM BROMIDE 62.5 MCG/ACT IN AEPB
1.0000 | INHALATION_SPRAY | Freq: Every day | RESPIRATORY_TRACT | Status: AC
  Administered 2024-08-27: 1 via RESPIRATORY_TRACT
  Filled 2024-08-27: qty 7

## 2024-08-27 MED ORDER — SODIUM CHLORIDE 0.9 % IV SOLN
1.0000 g | INTRAVENOUS | Status: AC
  Administered 2024-08-27: 1 g via INTRAVENOUS
  Filled 2024-08-27: qty 10

## 2024-08-27 MED ORDER — GUAIFENESIN ER 600 MG PO TB12
600.0000 mg | ORAL_TABLET | Freq: Two times a day (BID) | ORAL | Status: AC
  Administered 2024-08-27 (×3): 600 mg via ORAL
  Filled 2024-08-27 (×3): qty 1

## 2024-08-27 MED ORDER — DOXYCYCLINE HYCLATE 100 MG PO TABS
100.0000 mg | ORAL_TABLET | Freq: Two times a day (BID) | ORAL | Status: AC
  Administered 2024-08-27 (×2): 100 mg via ORAL
  Filled 2024-08-27 (×2): qty 1

## 2024-08-27 MED ORDER — IPRATROPIUM-ALBUTEROL 0.5-2.5 (3) MG/3ML IN SOLN
3.0000 mL | Freq: Four times a day (QID) | RESPIRATORY_TRACT | Status: AC | PRN
  Administered 2024-08-27: 3 mL via RESPIRATORY_TRACT
  Filled 2024-08-27: qty 3

## 2024-08-27 NOTE — Discharge Instructions (Signed)
 To Jason Carter or their caretakers,  You were recently admitted to [facility] for pneumonia.  Your condition was treated with antibiotics through your IV and orally.  During your stay, we also repeated a swallowing study with our speech therapist which showed _____. We also found that your hemoglobin (blood levels) were low which is likely in the setting of your previous health conditions as well as iron deficiency.  You were given ______.  Continue taking your home medications with the following changes:  Start taking Spiriva () - inhale 1 puff daily Stop taking    You should seek further medical care if you experience worsening shortness of breath, chest pain, or cough up significant amounts of blood.   We recommend that you also see your primary care doctor in about a week to make sure that you continue to improve. We are so glad that you are feeling better.  Sincerely,  [facility]

## 2024-08-27 NOTE — Progress Notes (Signed)
 SLP Cancellation Note  Patient Details Name: Jason Carter MRN: [id number] DOB: 09-13-1945   Cancelled treatment:       Reason Eval/Treat Not Completed: Patient at procedure or test/unavailable  Attempted BSE, however pt currently being transferred to 2W. Of note, Silent aspiration seen with multiple swallows on esophagram (04/2024) OP MBS completed 05/2024: No aspiration, Penetration thin/NTL. Will continue efforts.  Roxanne Panek B. Dory, MSP, CCC-SLP Speech Language Pathologist Office: [phone]  Dory Caprice Daring 08/27/2024, 12:56 PM

## 2024-08-27 NOTE — Telephone Encounter (Signed)
 Pharmacy Patient Advocate Encounter  Insurance verification completed.    The patient is insured through [facility]. Patient has Medicare and is not eligible for a copay card, but may be able to apply for patient assistance or Medicare RX Payment Plan (Patient Must reach out to their plan, if eligible for payment plan), if available.    Ran test claim for Incruse Ellipta  62.98mcg and it is not covered, insurance prefers Spiriva.   This test claim was processed through [facility]- copay amounts may vary at other pharmacies due to pharmacy/plan contracts, or as the patient moves through the different stages of their insurance plan.

## 2024-08-27 NOTE — Progress Notes (Addendum)
 "  HD#0 SUBJECTIVE:  Patient Summary: Jason Carter is a 79 y.o. male with a pertinent PMH of NSCLC s/p SBRT/chemotherapy, COPD (on baseline 2L O2 via [place]), HFrEF (EF of 50-55% in 10/2020), CAD, history of tobacco use disorder, previous atrial flutter s/p ablation in 2022, HTN, and rheumatoid arthritis on methotrexate  who presented with CP + SOB and admitted for COPD exacerbation 2/2 CAP.   Overnight Events: No overnight events, admitted last night.  Interim History: Patient reports that he feels well this morning. Mentions tingling in his chest. Says that he hasn't had hemoptysis in the last week or so but describes sneezing and seeing streaks of blood in his mucus. Denies hematuria, hematochezia, or melena. Reports having an outpatient swallow test 2-3 weeks ago and was told he aspirated while drinking water. Reports feeling pretty good after his breathing treatments. He initially presented to [facility] visit yesterday for his annual physical. Only lightheaded when he bends over and holds his breath while feeding his cats.   OBJECTIVE:  Vital Signs: Vitals:   08/27/24 0500 08/27/24 0530 08/27/24 0820 08/27/24 0902  BP: 115/60 111/62  122/69  Pulse: 98 87  (!) 50  Resp: 14 16  20   Temp:    (!) 97.5 F (36.4 C)  TempSrc:    Oral  SpO2: 100% 100% 100% 90%    There were no vitals filed for this visit.  No intake or output data in the 24 hours ending 08/27/24 1022 Net IO Since Admission: No IO data has been entered for this period [08/27/24 1022]  Physical Exam: Constitutional: Chronically ill-appearing thin,elderly male in no acute distress HENT: normocephalic atraumatic, mucous membranes moist Eyes: conjunctiva non-erythematous, PERRL, no scleral icterus Cardiovascular: slightly tachycardic with regular rhythm, no murmurs Pulmonary/Chest: normal work of breathing on 2L , bibasilar inspiratory crackles Abdominal: soft, non-tender, non-distended, bowel sounds normal Neurological: alert &  oriented x3, moving all extremities equally Skin: warm and dry Extremities: thickening of DIPs and PIPs bilaterally; no BLE or cyanosis; peripheral pulses intact Psych: normal mood and affect, thought content normal  Patient Lines/Drains/Airways Status     Active Line/Drains/Airways     Name Placement date Placement time Site Days   Peripheral IV 08/26/24 20 G Left Antecubital 08/26/24  1926  Antecubital  1            Pertinent labs and imaging:     Latest Ref Rng & Units 08/27/2024    3:52 AM 08/26/2024    5:15 PM 05/07/2024   11:12 AM  CBC  WBC 4.0 - 10.5 K/uL 10.3  12.8  11.6   Hemoglobin 13.0 - 17.0 g/dL 9.1  9.3  88.1   Hematocrit 39.0 - 52.0 % 30.5  31.3  36.6   Platelets 150 - 400 K/uL 150  180  215        Latest Ref Rng & Units 08/27/2024    3:52 AM 08/26/2024    5:15 PM 07/19/2024    4:20 PM  CMP  Glucose 70 - 99 mg/dL 864  892    BUN 8 - 23 mg/dL 12  13    Creatinine 9.38 - 1.24 mg/dL 9.45  9.40  9.29   Sodium 135 - 145 mmol/L 137  137    Potassium 3.5 - 5.1 mmol/L 4.1  4.0    Chloride 98 - 111 mmol/L 99  97    CO2 22 - 32 mmol/L 25  32    Calcium 8.9 - 10.3 mg/dL 8.7  9.4      CT Angio Chest PE W and/or Wo Contrast Result Date: 08/26/2024 EXAM: CTA of the Chest with contrast for PE 08/26/2024 07:56:01 PM TECHNIQUE: CTA of the chest was performed without and with the administration of 75 mL of iohexol  (OMNIPAQUE ) 350 MG/ML injection. Multiplanar reformatted images are provided for review. MIP images are provided for review. Automated exposure control, iterative reconstruction, and/or weight based adjustment of the mA/kV was utilized to reduce the radiation dose to as low as reasonably achievable. COMPARISON: Same day x-ray and CT chest / 12 / 29 / 25. CLINICAL HISTORY: Pulmonary embolism (PE) suspected, high probability. Shortness of breath FINDINGS: PULMONARY ARTERIES: Pulmonary arteries are adequately opacified for evaluation. Negative for pulmonary embolism. Main  pulmonary artery is normal in caliber. MEDIASTINUM: Cardiomegaly with dilated right ventricle. Coronary artery and aortic atherosclerotic calcifications. There is no acute abnormality of the thoracic aorta. LYMPH NODES: Calcified mediastinal and hilar lymph nodes. No axillary lymphadenopathy. LUNGS AND PLEURA: Advanced emphysema. Thick walled cavitary lesion in the left lung apex is similar to 12 / 29 / 25. There is new consolidation within the left upper lobe inferior to the cavitary lesion extending inferiorly into the lingula. Additional small ground-glass opacities in the left lower lobe. Nodule in the medial right apex measuring 13 x 6 mm is unchanged. No pleural effusion or pneumothorax. UPPER ABDOMEN: Limited images of the upper abdomen are unremarkable. SOFT TISSUES AND BONES: Chronic sternal fracture. No acute soft tissue abnormality. IMPRESSION: 1. No pulmonary embolism. 2. New consolidation in the left upper lobe and ground-glass opacities in the right lower lobe are likely due to pneumonia. Given rapid progression since 12 / 29 / 25 progression of malignancy is considered less likely but not excluded. 3. Unchanged cavitary lesion in the left apex and 13 mm right apical pulmonary nodule. 4. Advanced emphysema. Electronically signed by: Norman Gatlin MD 08/26/2024 08:11 PM EST RP Workstation: [id number]   VAS US  LOWER EXTREMITY VENOUS (DVT) (7a-5p) Result Date: 08/26/2024  Lower Venous DVT Study Patient Name:  Jason Carter  Date of Exam:   08/26/2024 Medical Rec #: [id number]    Accession #:    [id number] Date of Birth: [birth year]    Patient Gender: M Patient Age:   49 years Exam Location:  [facility] Procedure:      VAS US  LOWER EXTREMITY VENOUS (DVT) Referring Phys: LEITA MURPHY --------------------------------------------------------------------------------  Indications: Swelling, Edema, and SOB.  Comparison Study: No prior exam. Performing Technologist: Edilia Elden Appl  Examination  Guidelines: A complete evaluation includes B-mode imaging, spectral Doppler, color Doppler, and power Doppler as needed of all accessible portions of each vessel. Bilateral testing is considered an integral part of a complete examination. Limited examinations for reoccurring indications may be performed as noted. The reflux portion of the exam is performed with the patient in reverse Trendelenburg.  +---------+---------------+---------+-----------+----------+--------------+ RIGHT    CompressibilityPhasicitySpontaneityPropertiesThrombus Aging +---------+---------------+---------+-----------+----------+--------------+ CFV      Full           Yes      Yes                                 +---------+---------------+---------+-----------+----------+--------------+ SFJ      Full           Yes      Yes                                 +---------+---------------+---------+-----------+----------+--------------+  FV Prox  Full                                                        +---------+---------------+---------+-----------+----------+--------------+ FV Mid   Full                                                        +---------+---------------+---------+-----------+----------+--------------+ FV DistalFull                                                        +---------+---------------+---------+-----------+----------+--------------+ PFV      Full                                                        +---------+---------------+---------+-----------+----------+--------------+ POP      Full           Yes      Yes                                 +---------+---------------+---------+-----------+----------+--------------+ PTV      Full                                                        +---------+---------------+---------+-----------+----------+--------------+ PERO     Full                                                         +---------+---------------+---------+-----------+----------+--------------+   +---------+---------------+---------+-----------+----------+--------------+ LEFT     CompressibilityPhasicitySpontaneityPropertiesThrombus Aging +---------+---------------+---------+-----------+----------+--------------+ CFV      Full           Yes      Yes                                 +---------+---------------+---------+-----------+----------+--------------+ SFJ      Full           Yes      Yes                                 +---------+---------------+---------+-----------+----------+--------------+ FV Prox  Full                                                        +---------+---------------+---------+-----------+----------+--------------+  FV Mid   Full                                                        +---------+---------------+---------+-----------+----------+--------------+ FV DistalFull                                                        +---------+---------------+---------+-----------+----------+--------------+ PFV      Full                                                        +---------+---------------+---------+-----------+----------+--------------+ POP      Full           Yes      Yes                                 +---------+---------------+---------+-----------+----------+--------------+ PTV      Full                                                        +---------+---------------+---------+-----------+----------+--------------+ PERO     Full                                                        +---------+---------------+---------+-----------+----------+--------------+     Summary: BILATERAL: - No evidence of deep vein thrombosis seen in the lower extremities, bilaterally. -No evidence of popliteal cyst, bilaterally.   *See table(s) above for measurements and observations. Electronically signed by Norman Serve on 08/26/2024 at 6:32:04 PM.     Final    DG Chest 1 View Result Date: 08/26/2024 EXAM: 1 VIEW(S) XRAY OF THE CHEST 08/26/2024 05:09:00 PM COMPARISON: CT 12 / 29 / 25. CLINICAL HISTORY: Shortness of breath. FINDINGS: LUNGS AND PLEURA: Left lower lung zone patchy airspace opacity. Spiculated cavitary opacity with adjacent surgical clips overlying left upper lung. Blunting of both costophrenic angles due to pleural thickening or trace pleural effusions. No pneumothorax. HEART AND MEDIASTINUM: Atherosclerotic plaque. No acute abnormality of the cardiac and mediastinal silhouettes. BONES AND SOFT TISSUES: No acute osseous abnormality. IMPRESSION: 1. Left basilar opacity suggesting pneumonia or aspiration. 2. Spiculated left upper lung cavitary opacity with adjacent surgical clip, better evaluated on CT 12 / 29 / 25. Electronically signed by: Norman Gatlin MD 08/26/2024 05:30 PM EST RP Workstation: [id number]    ASSESSMENT/PLAN:  Assessment: Principal Problem:   CAP (community acquired pneumonia) Active Problems:   Chronic respiratory failure with hypoxia (HCC)   COPD (chronic obstructive pulmonary disease) (HCC)  Plan: #CAP #Bilateral Pleural Effusions #COPD with chronic hypoxic respiratory failure (on [facility]) Initially presented with CP, SOB, and tachycardia. Sent  from [facility] d/t c/f PE vs ACS but CT/DVT studies negative and troponins were flat. EKG without ST/T wave changes. Patient reports that he's feeling better after his breathing treatments. Not SOB at this time or having CP. PFTs in 2021 showed FEV1/FVC severely decreased to 0.3-0.4 and he has required 2L [place] since April 2022. Imaging showing GGOs in RLL c/f PNA vs less likely progression of malignancy, unchanged cavitary lesions, and bilateral trace pleural effusions. Initial leukocytosis is improving with IV ceftriaxone  and doxycycline . EDP started steroids for COPD exacerbation but he has no wheezing on exam, minimal sx that are concerning for exacerbation. Will discontinue  steroids as this patient already is immunosuppressed from weekly methotrexate  for his RA. Effusions likely in setting of RA vs malignancy vs PNA. Continuing IV antibiotics and changing to nebulizers as needed with maintenance inhaler (not previously on one). Incruse Ellipta  while inpatient but may switch to Spiriva due to insurance coverage.  - Continue IV Ceftriaxone  x 5 days (started 2/5) - Continue PO Doxycycline  100 mg bid x 5 day (started 2/5) - DuoNebs q6h PRN for wheezing/SOB - Incruse Ellipta  1 puff daily while admitted - Mucinex  600 mg bid - Continue home 2L [place] - Flutter valve/IS - PT/OT eval + treat  #Afib s/p TEE/DCCV, Ablated 11/2020 #Aflutter EKG on presentation with sinus tachycardia. Previously ablated in June 2022 by Dr. Waddell ([facility] EP). HR downtrending after initiation of antibiotics and treatment of COPD. May have been in setting of acute infection, dehydration, and increased home albuterol  use. Will continue monitoring on telemetry.  - Continuous cardiac monitoring  #Rheumatoid Arthritis #Mild Dysphagia Follows closely with Dr. Dolphus, on weekly methotrexate  and folate supplementation. Has been having dysphagia for several months now, aspiration of small liquids seen initially on esophagram but outpatient MBS in Nov 2025 negative for aspirations. Did show mild pharyngoesophageal dysphagia. No signs of esophageal strictures. Do not see recent swallow evaluation that patient is mentioning. Appreciate SLP evaluation. Holding methotrexate  in setting of acute pneumonia.  - Hold home methotrexate  - Continue folic acid  2 mg daily - SLP consulted, appreciate recommendations  #Normocytic anemia Hemoptysis but this is low volume and hasn't had for a week. Denies other bleeding symptoms. Hgb today of 9.1 with baseline Hgb 10-11 range. Anemia likely multifactorial - anemia of chronic disease d/t lung cancer, iron deficiency, folate deficiency from methotrexate  use. Has not had  recent anemia labs, will update while inpatient. Holding methotrexate . - Iron studies, reticulocytes, folate/B12 pending - Trend CBCs - Hold home methotrexate   #NSCLC s/p SBRT Putative stage IA2, [id number] NSCLC of RUL, previously treated with radiation and with new recurrence seen on PET in August 2025. Following with Dr. Dewey for SBRT. CT imaging showing new consolidation in LUL and GGOs in RLL which are more likely PNA than progression of cancer.   #History of Hypertension #Hypotension BP intermittently low but maintaining MAPS > 65. History of hypertension previously on bisoprolol  but this was discontinued in April 2024. No longer on any anti-hypertensives. Given dysphagia and cancer, concern for orthostasis due to poor oral intake. Continuing to monitor BP. - Orthostatic vitals pending  #Aortic Atherosclerosis Seen on imaging. Last LDL of 65 on 12/29/23. Not currently on statin therapy.  - Continue ASA 81 mg daily  #CHF ProBNP normal for age. Last EF of 50-55% on TTE. Does have crackles but these are only at bases, no BLE edema. Not hypervolemic. He is not taking any GDMT, will hold off on starting as he likely will  not tolerate given hypotension.  Chronic Stable Medical Conditions #Dry Eyes - artificial tears daily prn #TUD - no acute concerns #Gout - no acute concerns   Best Practice: Diet: Regular (pending SLP eval) IVF: Fluids: None, Rate: None VTE: enoxaparin  (LOVENOX ) injection 40 mg Start: 08/27/24 1000 Code: Full  Disposition planning: Therapy Recs: Pending, DME: Pending Family Contact: Christina (Granddaughter), to be notified. DISPO: Anticipated discharge in 1-2 days to Home pending clinical improvement.  Signature:  Letha Cheadle, MD [facility]  PGY-1 08/27/2024, 10:22 AM  On Call pager [phone]  "

## 2024-08-27 NOTE — Plan of Care (Signed)

## 2024-11-09 ENCOUNTER — Ambulatory Visit: Admitting: Rheumatology

## 2024-12-14 ENCOUNTER — Ambulatory Visit: Admitting: Radiation Oncology
